# Patient Record
Sex: Male | Born: 1955 | Race: White | Hispanic: No | Marital: Married | Smoking: Never smoker
Health system: Southern US, Community
[De-identification: ages and names within clinical notes are randomized; demographics above are authoritative.]

## PROBLEM LIST (undated history)

## (undated) DIAGNOSIS — K5900 Constipation, unspecified: Secondary | ICD-10-CM

## (undated) DIAGNOSIS — E7800 Pure hypercholesterolemia, unspecified: Secondary | ICD-10-CM

## (undated) DIAGNOSIS — R001 Bradycardia, unspecified: Secondary | ICD-10-CM

## (undated) DIAGNOSIS — M109 Gout, unspecified: Secondary | ICD-10-CM

## (undated) DIAGNOSIS — I2510 Atherosclerotic heart disease of native coronary artery without angina pectoris: Secondary | ICD-10-CM

## (undated) HISTORY — DX: Constipation, unspecified: K59.00

## (undated) HISTORY — DX: Bradycardia, unspecified: R00.1

## (undated) HISTORY — DX: Gout, unspecified: M10.9

## (undated) HISTORY — DX: Atherosclerotic heart disease of native coronary artery without angina pectoris: I25.10

---

## 2000-07-04 ENCOUNTER — Encounter: Payer: Self-pay | Admitting: Emergency Medicine

## 2000-07-04 ENCOUNTER — Emergency Department (HOSPITAL_COMMUNITY): Admission: EM | Admit: 2000-07-04 | Discharge: 2000-07-04 | Payer: Self-pay | Admitting: Emergency Medicine

## 2000-07-07 ENCOUNTER — Emergency Department (HOSPITAL_COMMUNITY): Admission: EM | Admit: 2000-07-07 | Discharge: 2000-07-07 | Payer: Self-pay | Admitting: *Deleted

## 2000-07-23 ENCOUNTER — Encounter: Payer: Self-pay | Admitting: Internal Medicine

## 2000-07-23 ENCOUNTER — Ambulatory Visit (HOSPITAL_COMMUNITY): Admission: RE | Admit: 2000-07-23 | Discharge: 2000-07-23 | Payer: Self-pay | Admitting: Internal Medicine

## 2000-08-15 ENCOUNTER — Ambulatory Visit (HOSPITAL_COMMUNITY): Admission: RE | Admit: 2000-08-15 | Discharge: 2000-08-15 | Payer: Self-pay | Admitting: Internal Medicine

## 2001-09-16 ENCOUNTER — Encounter: Payer: Self-pay | Admitting: Internal Medicine

## 2001-09-16 ENCOUNTER — Ambulatory Visit (HOSPITAL_COMMUNITY): Admission: RE | Admit: 2001-09-16 | Discharge: 2001-09-16 | Payer: Self-pay | Admitting: Internal Medicine

## 2003-05-31 ENCOUNTER — Ambulatory Visit (HOSPITAL_COMMUNITY): Admission: RE | Admit: 2003-05-31 | Discharge: 2003-05-31 | Payer: Self-pay | Admitting: Family Medicine

## 2004-09-25 ENCOUNTER — Ambulatory Visit (HOSPITAL_COMMUNITY): Admission: RE | Admit: 2004-09-25 | Discharge: 2004-09-25 | Payer: Self-pay | Admitting: Family Medicine

## 2004-11-15 ENCOUNTER — Ambulatory Visit: Payer: Self-pay | Admitting: Internal Medicine

## 2004-11-15 ENCOUNTER — Ambulatory Visit (HOSPITAL_COMMUNITY): Admission: RE | Admit: 2004-11-15 | Discharge: 2004-11-15 | Payer: Self-pay | Admitting: Internal Medicine

## 2006-03-19 ENCOUNTER — Emergency Department (HOSPITAL_COMMUNITY): Admission: EM | Admit: 2006-03-19 | Discharge: 2006-03-19 | Payer: Self-pay | Admitting: Emergency Medicine

## 2006-05-16 HISTORY — PX: CARDIAC CATHETERIZATION: SHX172

## 2006-10-01 ENCOUNTER — Ambulatory Visit (HOSPITAL_COMMUNITY): Admission: RE | Admit: 2006-10-01 | Discharge: 2006-10-01 | Payer: Self-pay | Admitting: Urology

## 2006-12-08 ENCOUNTER — Emergency Department (HOSPITAL_COMMUNITY): Admission: EM | Admit: 2006-12-08 | Discharge: 2006-12-08 | Payer: Self-pay | Admitting: Emergency Medicine

## 2007-03-06 ENCOUNTER — Emergency Department (HOSPITAL_COMMUNITY): Admission: EM | Admit: 2007-03-06 | Discharge: 2007-03-06 | Payer: Self-pay | Admitting: Emergency Medicine

## 2009-07-31 HISTORY — PX: US ECHOCARDIOGRAPHY: HXRAD669

## 2009-07-31 HISTORY — PX: NM MYOCAR PERF WALL MOTION: HXRAD629

## 2010-02-22 ENCOUNTER — Emergency Department (HOSPITAL_COMMUNITY): Payer: 59

## 2010-02-22 ENCOUNTER — Emergency Department (HOSPITAL_COMMUNITY)
Admission: EM | Admit: 2010-02-22 | Discharge: 2010-02-22 | Disposition: A | Discharge status: Home / Self Care | Payer: 59 | Attending: Emergency Medicine | Admitting: Emergency Medicine

## 2010-02-22 DIAGNOSIS — X503XXA Overexertion from repetitive movements, initial encounter: Secondary | ICD-10-CM | POA: Insufficient documentation

## 2010-02-22 DIAGNOSIS — Y92009 Unspecified place in unspecified non-institutional (private) residence as the place of occurrence of the external cause: Secondary | ICD-10-CM | POA: Insufficient documentation

## 2010-02-22 DIAGNOSIS — S336XXA Sprain of sacroiliac joint, initial encounter: Secondary | ICD-10-CM | POA: Insufficient documentation

## 2010-05-25 NOTE — Op Note (Signed)
Peter Burch, Peter Burch               ACCOUNT NO.:  [id number]   MEDICAL RECORD NO.:  [id number]          PATIENT TYPE:  AMB   LOCATION:  DAY                           FACILITY:  [facility]   PHYSICIAN:  Lionel December, M.D.    DATE OF BIRTH:  [birth year]   DATE OF PROCEDURE:  11/15/2004  DATE OF DISCHARGE:                                 OPERATIVE REPORT   PROCEDURE:  Colonoscopy.   INDICATION:  Peter Burch is a 55 year old Caucasian male who is undergoing  screening colonoscopy. Family history is negative for colorectal carcinoma.  Procedure risks were reviewed with the patient, and informed consent was  obtained.   PREMEDICATION:  Demerol 50 mg IV, Versed 6 mg IV divided dose.   FINDINGS:  Procedure performed in endoscopy suite. The patient's vital signs  and O2 saturation were monitored during the procedure and remained stable.  The patient was placed in left lateral position and rectal examination  performed. No abnormality noted on external or digital exam. Olympus  videoscope was placed in rectum and advanced under vision into sigmoid colon  and beyond. Preparation was satisfactory. Scope was passed to cecum which  was identified by ileocecal valve and appendiceal orifice. Pictures taken  for the record. As the scope was withdrawn, colonic mucosa was carefully  examined. There was a single small diverticulum at transverse colon, but no  other abnormalities were noted. Rectal mucosa was normal. Scope was  retroflexed to examine anorectal junction which was unremarkable. There was  focal erythema proximal to dentate line felt to be trauma due to Fleet's  enema prep. Endoscope was straightened and withdrawn. The patient tolerated  the procedure well.   FINAL DIAGNOSIS:  Single small diverticulum at transverse colon, otherwise  normal colonoscopy.   RECOMMENDATIONS:  1.  High-fiber diet.  2.  Yearly Hemoccults. He may consider next screening exam in 10 years from      now.      Lionel December, M.D.  Electronically Signed     NR/MEDQ  D:  11/15/2004  T:  11/15/2004  Job:  [id number]   cc:   Kirk Ruths, M.D.  Fax: [phone]

## 2010-10-15 LAB — DIFFERENTIAL
Basophils Absolute: 0
Basophils Relative: 1
Eosinophils Absolute: 0.5
Eosinophils Relative: 8 — ABNORMAL HIGH
Lymphocytes Relative: 20

## 2010-10-15 LAB — CBC
HCT: 42.2
MCHC: 34.4
Platelets: 158
RDW: 13.9

## 2010-10-15 LAB — I-STAT 8, (EC8 V) (CONVERTED LAB)
BUN: 9
Glucose, Bld: 108 — ABNORMAL HIGH
Hemoglobin: 15
Potassium: 4.2
Sodium: 141
TCO2: 26

## 2010-10-15 LAB — AMYLASE: Amylase: 47

## 2010-10-15 LAB — POCT CARDIAC MARKERS: CKMB, poc: 1.3

## 2010-10-15 LAB — LIPASE, BLOOD: Lipase: 24

## 2012-08-19 ENCOUNTER — Ambulatory Visit: Payer: 59 | Admitting: Cardiovascular Disease

## 2012-08-19 ENCOUNTER — Encounter: Payer: Self-pay | Admitting: *Deleted

## 2012-08-20 ENCOUNTER — Ambulatory Visit (INDEPENDENT_AMBULATORY_CARE_PROVIDER_SITE_OTHER): Payer: 59 | Admitting: Cardiovascular Disease

## 2012-08-20 ENCOUNTER — Encounter: Payer: Self-pay | Admitting: Cardiovascular Disease

## 2012-08-20 VITALS — BP 118/80 | HR 66 | Resp 16 | Ht 75.0 in | Wt 233.8 lb

## 2012-08-20 DIAGNOSIS — E785 Hyperlipidemia, unspecified: Secondary | ICD-10-CM

## 2012-08-20 DIAGNOSIS — I2510 Atherosclerotic heart disease of native coronary artery without angina pectoris: Secondary | ICD-10-CM

## 2012-08-20 DIAGNOSIS — E782 Mixed hyperlipidemia: Secondary | ICD-10-CM

## 2012-08-20 DIAGNOSIS — Z79899 Other long term (current) drug therapy: Secondary | ICD-10-CM

## 2012-08-20 NOTE — Patient Instructions (Addendum)
Your physician recommends that you return for lab work. Please fast prior to having your blood work performed.   Your physician recommends that you schedule a follow-up appointment in: 12 months

## 2012-08-21 LAB — COMPREHENSIVE METABOLIC PANEL
ALT: 31 U/L (ref 0–53)
AST: 21 U/L (ref 0–37)
Alkaline Phosphatase: 76 U/L (ref 39–117)
Chloride: 104 mEq/L (ref 96–112)
Creat: 1.29 mg/dL (ref 0.50–1.35)
Total Bilirubin: 1.1 mg/dL (ref 0.3–1.2)

## 2012-08-21 LAB — LIPID PANEL
HDL: 43 mg/dL (ref >39)
LDL Cholesterol: 137 mg/dL — ABNORMAL HIGH (ref 0–99)
Total CHOL/HDL Ratio: 5 Ratio
VLDL: 34 mg/dL (ref 0–40)

## 2012-08-26 ENCOUNTER — Telehealth: Payer: Self-pay | Admitting: Cardiovascular Disease

## 2012-08-26 NOTE — Telephone Encounter (Signed)
Returned call and spoke w/ pt.  Informed results have not been reviewed by MD/PA and nurse will call after they are reviewed.  Pt verbalized understanding and agreed w/ plan.  Pt would like results called to him when reviewed and informed Britta Mccreedy, CMA will be notified.

## 2012-08-26 NOTE — Telephone Encounter (Signed)
Would like to know the results of his lab work . Can l;eave a msg with the results .Marland Kitchen Please Call.   Thanks

## 2012-08-28 MED ORDER — PRAVASTATIN SODIUM 40 MG PO TABS: 40.0000 mg | ORAL_TABLET | Freq: Every evening | ORAL | Status: DC

## 2012-08-28 NOTE — Telephone Encounter (Signed)
Called and recommended pravastatin 40 mg every evening and recheck lipids in 3 months. Rx and labs ordered.

## 2012-09-01 ENCOUNTER — Encounter: Payer: Self-pay | Admitting: Cardiovascular Disease

## 2012-09-01 DIAGNOSIS — E782 Mixed hyperlipidemia: Secondary | ICD-10-CM | POA: Insufficient documentation

## 2012-09-01 DIAGNOSIS — I2510 Atherosclerotic heart disease of native coronary artery without angina pectoris: Secondary | ICD-10-CM | POA: Insufficient documentation

## 2012-09-01 NOTE — Progress Notes (Signed)
Patient ID: Peter Burch, male   DOB: Oct 04, 1955, 57 y.o.   MRN: [id number]     Reason for office visit Coronary atherosclerosis, hyperlipidemia  Peter Burch is now 57 years old he continues to stay very active. He walks or runs 3 miles a day at least 3-4 days a week. He is able to complete a mile in less than 12 minutes. He has no complaints of shortness of breath, chest pain, dizziness or intermittent claudication during activity. He also denies any focal neurological complaints, erectile dysfunction or lower extremity edema.  Cardiac catheterization performed in May of 2008 showed a 40% mildly calcified lesion in the proximal LAD artery. Nuclear stress testing in 2011 did not show any evidence of anterior wall abnormalities although it did show an inferior wall defect interpreted as representing scar with mild peri-infarct ischemia. His electrocardiogram does not show evidence of a previous inferior wall infarct. He is very tall and has a very deep chest suggesting that the abnormality may actually have represented diaphragmatic attenuation artifact. Repeat cardiac catheterization was not performed. A similar defect had led to his cardiac catheterization in 2008 and no stenoses were seen in the dominant right coronary artery at that time.  Despite his active lifestyle he continues to have an unfavorable lipid profile total cholesterol 224 LDL 155 HDL 42 triglycerides 136. Numerous statins have been tried in the past tablet to lifestyle limiting muscle weakness and pain. He is currently not on statin therapy.   He also has a history of gout but no recent attacks.    Allergies  Allergen Reactions  . Statins     Myalgias Crestor,vytorin,livalo    Current Outpatient Prescriptions  Medication Sig Dispense Refill  . ALPRAZolam (XANAX) 0.5 MG tablet Take 1 tablet by mouth as needed.      . colchicine 0.6 MG tablet Take 0.6 mg by mouth daily as needed.      . febuxostat (ULORIC) 40 MG tablet Take  40 mg by mouth daily.       . fluticasone (FLONASE) 50 MCG/ACT nasal spray Place 1 spray into the nose as needed.      . pravastatin (PRAVACHOL) 40 MG tablet Take 1 tablet (40 mg total) by mouth every evening.  90 tablet  3   No current facility-administered medications for this visit.    Past Medical History  Diagnosis Date  . CAD (coronary artery disease)   . Sinus bradycardia     Past Surgical History  Procedure Laterality Date  . Cardiac catheterization  05/16/2006    40% LAD lesion  . US echocardiography  07/31/2009    impaired LV relaxation  . Nm myocar perf wall motion  07/31/2009    mod to severe defect due to infarct/scar w/mild perinfarct ischemia    Family History  Problem Relation Age of Onset  . Heart attack Mother   . Cancer Father     lung    History   Social History  . Marital Status: Married    Spouse Name: N/A    Number of Children: N/A  . Years of Education: N/A   Occupational History  . Not on file.   Social History Main Topics  . Smoking status: Never Smoker   . Smokeless tobacco: Never Used  . Alcohol Use: Yes     Comment: social, occasionally  . Drug Use: No  . Sexual Activity: Not on file   Other Topics Concern  . Not on file   Social History  Narrative  . No narrative on file    Review of systems: The patient specifically denies any chest pain at rest or with exertion, dyspnea at rest or with exertion, orthopnea, paroxysmal nocturnal dyspnea, syncope, palpitations, focal neurological deficits, intermittent claudication, lower extremity edema, unexplained weight gain, cough, hemoptysis or wheezing.  The patient also denies abdominal pain, nausea, vomiting, dysphagia, diarrhea, constipation, polyuria, polydipsia, dysuria, hematuria, frequency, urgency, abnormal bleeding or bruising, fever, chills, unexpected weight changes, mood swings, change in skin or hair texture, change in voice quality, auditory or visual problems, allergic reactions  or rashes, new musculoskeletal complaints other than usual "aches and pains".   PHYSICAL EXAM BP 118/80  Pulse 66  Resp 16  Ht 6\' 3"  (1.905 m)  Wt 233 lb 12.8 oz (106.051 kg)  BMI 29.22 kg/m2  General: Alert, oriented x3, no distress Head: no evidence of trauma, PERRL, EOMI, no exophtalmos or lid lag, no myxedema, no xanthelasma; normal ears, nose and oropharynx Neck: normal jugular venous pulsations and no hepatojugular reflux; brisk carotid pulses without delay and no carotid bruits Chest: clear to auscultation, no signs of consolidation by percussion or palpation, normal fremitus, symmetrical and full respiratory excursions Cardiovascular: normal position and quality of the apical impulse, regular rhythm, normal first and second heart sounds, no murmurs, rubs or gallops Abdomen: no tenderness or distention, no masses by palpation, no abnormal pulsatility or arterial bruits, normal bowel sounds, no hepatosplenomegaly Extremities: no clubbing, cyanosis or edema; 2+ radial, ulnar and brachial pulses bilaterally; 2+ right femoral, posterior tibial and dorsalis pedis pulses; 2+ left femoral, posterior tibial and dorsalis pedis pulses; no subclavian or femoral bruits Neurological: grossly nonfocal   EKG: NSR, normal  Lipid Panel  January 2014 total cholesterol 224, triglycerides  136, HDL 42, LDL 155    Component Value Date/Time   CHOL 214* 08/21/2012 0704   TRIG 171* 08/21/2012 0704   HDL 43 08/21/2012 0704   CHOLHDL 5.0 08/21/2012 0704   VLDL 34 08/21/2012 0704   LDLCALC 137* 08/21/2012 0704    BMET    Component Value Date/Time   NA 140 08/21/2012 0704   K 4.1 08/21/2012 0704   CL 104 08/21/2012 0704   CO2 28 08/21/2012 0704   GLUCOSE 80 08/21/2012 0704   BUN 15 08/21/2012 0704   CREATININE 1.29 08/21/2012 0704   CREATININE 1.2 12/08/2006 0507   CALCIUM 9.7 08/21/2012 0704     ASSESSMENT AND PLAN Coronary atherosclerosis Despite a very active lifestyle he is completely free of  angina or other cardiac symptoms. Unfortunately his major coronary risk factors (hypercholesterolemia) are difficult to address. One thing he can therefore improve upon his weight control since he is borderline obese. Have encouraged him to keep up the good work with exercise but to pay more attention to calorie restriction, especially avoiding sugars and starches of high glycemic index and increasing his intake of protein and unsaturated fat.  Hyperlipidemia His lipid profile is still unfavorable. He has actually gained about 4 pounds since his last appointment so it is unlikely to be any better. He agrees to give pravastatin and tries well. Being the most water-soluble statin this is hopefully less likely to cause the side effects. We will recheck his lipid profile in several months. Niacin is not a good choice because of his history of gout. Fibrates are not likely to be very beneficial given his lipid profile   Orders Placed This Encounter  Procedures  . Comp Met (CMET)  . Lipid Profile  .  Lipid Profile  . EKG 12-Lead   Meds ordered this encounter  Medications  . ALPRAZolam (XANAX) 0.5 MG tablet    Sig: Take 1 tablet by mouth as needed.  . fluticasone (FLONASE) 50 MCG/ACT nasal spray    Sig: Place 1 spray into the nose as needed.  . pravastatin (PRAVACHOL) 40 MG tablet    Sig: Take 1 tablet (40 mg total) by mouth every evening.    Dispense:  90 tablet    Refill:  3    Yazen Rosko  Thurmon Fair, MD, [facility] [phone] office [phone] pager

## 2012-09-01 NOTE — Assessment & Plan Note (Addendum)
His lipid profile is still unfavorable. He has actually gained about 4 pounds since his last appointment so it is unlikely to be any better. He agrees to give pravastatin and tries well. Being the most water-soluble statin this is hopefully less likely to cause the side effects. We will recheck his lipid profile in several months. Niacin is not a good choice because of his history of gout. Fibrates are not likely to be very beneficial given his lipid profile

## 2012-09-01 NOTE — Assessment & Plan Note (Signed)
Despite a very active lifestyle he is completely free of angina or other cardiac symptoms. Unfortunately his major coronary risk factors (hypercholesterolemia) are difficult to address. One thing he can therefore improve upon his weight control since he is borderline obese. Have encouraged him to keep up the good work with exercise but to pay more attention to calorie restriction, especially avoiding sugars and starches of high glycemic index and increasing his intake of protein and unsaturated fat.

## 2012-09-17 ENCOUNTER — Encounter: Payer: Self-pay | Admitting: Cardiovascular Disease

## 2012-10-01 ENCOUNTER — Telehealth: Payer: Self-pay | Admitting: *Deleted

## 2012-10-01 NOTE — Telephone Encounter (Signed)
Did not check MyChart for a response to his question about side effects of Pravastatin.  States he stopped taking the day he sent the e-mail and about 4 days later symptoms resolved.  He has failed other statins due to myalgias.  He will stay off Pravastatin and continue w/weight loss, exercise and good diet.

## 2012-10-01 NOTE — Telephone Encounter (Signed)
LM to check his e-mail- re-response to his e-mail.  Told to check and if he has any questions please call.

## 2012-10-14 ENCOUNTER — Telehealth (HOSPITAL_COMMUNITY): Payer: Self-pay | Admitting: Dietician

## 2012-10-14 NOTE — Telephone Encounter (Signed)
Received referral via fax from [facility] for dx: wt loss, gout.

## 2012-10-15 NOTE — Telephone Encounter (Signed)
Called and left message on voicemail at 1020.

## 2012-10-19 NOTE — Telephone Encounter (Signed)
Returned call at [facility]. Left another message on voicemail.

## 2012-10-19 NOTE — Telephone Encounter (Signed)
Pt left voicemail at 1222. Called back at 1225. Pt requests appointment either today or tomorrow, due to being off. Appointment scheduled for 10/20/12 at 1530.

## 2012-10-19 NOTE — Telephone Encounter (Signed)
Received voicemail left by pt at 0910.

## 2012-10-20 ENCOUNTER — Encounter (HOSPITAL_COMMUNITY): Payer: Self-pay | Admitting: Dietician

## 2012-10-20 NOTE — Progress Notes (Signed)
Outpatient Initial Nutrition Assessment  Date:10/20/2012   Appt Start Time: 1526  Referring Physician: Dr. Titus Dubin Reason for Visit: weight loss, gout  Nutrition Assessment:  Height: 6\' 3"  (190.5 cm)   Weight: 228 lb (103.42 kg)   IBW: 196# %IBW: 116% UBW: 236# %UBW: 97%  Body mass index is 28.5 kg/(m^2).  Meets criteria for obesity. Goal Weight: 205# (10% loss of current weight) Weight hx: Pt reports his heaviest weight was 236# last year. Noted a 5# (2.1%) loss x 10 months and 8# (3.3%) wt loss x 1 year.  Estimated nutritional needs:  Kcals/ day: 1900-2000 Protein (grams)/day: 83-104 Fluid (L)/ day: 1.9-2.0  PMH:  Past Medical History  Diagnosis Date  . CAD (coronary artery disease)   . Sinus bradycardia   . Gout     Medications:  Current Outpatient Rx  Name  Route  Sig  Dispense  Refill  . ALPRAZolam (XANAX) 0.5 MG tablet   Oral   Take 1 tablet by mouth as needed.         . colchicine 0.6 MG tablet   Oral   Take 0.6 mg by mouth daily as needed.         . febuxostat (ULORIC) 40 MG tablet   Oral   Take 40 mg by mouth daily.          . fluticasone (FLONASE) 50 MCG/ACT nasal spray   Nasal   Place 1 spray into the nose as needed.         . pravastatin (PRAVACHOL) 40 MG tablet   Oral   Take 1 tablet (40 mg total) by mouth every evening.   90 tablet   3     Labs: CMP     Component Value Date/Time   NA 140 08/21/2012 0704   K 4.1 08/21/2012 0704   CL 104 08/21/2012 0704   CO2 28 08/21/2012 0704   GLUCOSE 80 08/21/2012 0704   BUN 15 08/21/2012 0704   CREATININE 1.29 08/21/2012 0704   CREATININE 1.2 12/08/2006 0507   CALCIUM 9.7 08/21/2012 0704   PROT 6.1 08/21/2012 0704   ALBUMIN 4.4 08/21/2012 0704   AST 21 08/21/2012 0704   ALT 31 08/21/2012 0704   ALKPHOS 76 08/21/2012 0704   BILITOT 1.1 08/21/2012 0704    Lipid Panel     Component Value Date/Time   CHOL 214* 08/21/2012 0704   TRIG 171* 08/21/2012 0704   HDL 43 08/21/2012 0704   CHOLHDL 5.0  08/21/2012 0704   VLDL 34 08/21/2012 0704   LDLCALC 137* 08/21/2012 0704     No results found for this basename: HGBA1C   Lab Results  Component Value Date   LDLCALC 137* 08/21/2012   CREATININE 1.29 08/21/2012     Lifestyle/ social habits: Peter Burch resides in [place] with his wife. His children are grown. 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 He reports that he has tried to become physically active this past year and has been walking and running for 45 minutes to 1 hours 3-4 times per week in the summer month. However, he tell this RD, "once the time changes that will stop; I don't want to run in the dark and I've never been a gym person". He occasionally runs on the weekends. He also tracks his physical activity on an app on his phone.   Nutrition hx/habits: Peter Burch reports "I'm going to be honest with you; I'm not a very good  patient. I only take my gout medications when I have flare-ups, but I'm trying to be better about taking it on a more consistent basis". He shares his latest labs results with me and he states that his cholesterol is always slightly elevated and this is "normal" for him. He is concerned that his triglycerides are "the highest they've ever been".  He states that he was advised to lose weight at his last cardiologist appointment. He reports he was told he was "borderline obese" and that concerned him. He states that he looks at his weight realistically and reports "I don't want to look skinny or sick". In the past year, he has noticed that he has gone up an inch to 1 1/2 inches in pant sizes. He reports this is due to a busy lifestyle, mainly taking care of his mother-in-law. He reports "evenings my wife and I fended for ourselves. With all that was going on, it was easier to eat some chips or go out then prepare a meal".   Diet recall: Pt reports excessive snacking and increased convenience foods, particularly at dinner time. He eats fast food  daily at lunch.   Nutrition Diagnosis: Involuntary weight gain r/t excessive energy intake, physical inactivity AEB pt reports hx of weight gain.   Nutrition Intervention: Nutrition rx: 1800 kcal NAS, no sugar added diet; 3 meals per day; low calorie beverages most often; be as physically active as possible (goal of at least 2.5 hours physical activity daily)  Education/Counseling Provided: Educated pt on principles of low purine diet. Discussed sources of purines and identified low, moderate, and high purine foods. Discussed role purine have in the development of uric acid, which can aggravate gout. Educated pt on principles of weight management. Discussed principles of energy expenditure and how changes in diet and physical activity affect weight status. Discussed nutritional content of commonly eaten foods and suggested healthier alternatives. Educated pt on plate method and a general, healthful diet that includes low fat dairy, lean meats, whole fruits and vegetables, and whole grains most often. Discussed importance of a healthy diet along with regular physical activity (at least 30 minutes 5 times per week) to achieve weight loss goals. Encouraged pt to continue to engage in physical activity as best as he could during the darker months and suggested he think of alternative exercises that he can do when it is dark. Encouraged slow, moderate weight loss (0.5-2# weight loss per week) and adopting healthy lifestyle changes vs. obtaining a certain body type or weight. Used TeachBack to assess understanding.   Understanding, Motivation, Ability to Follow Recommendations: Expect fair compliance.   Monitoring and Evaluation: Goals: 1) 0.5-2# wt loss per week; 2) 2.5 hours physical activity daily  Recommendations: 1) Be as physically active as possible- look for other alternatives other than running that you will be able to do when it gets dark; 2) Aim for 7-10% weight loss  F/U: PRN. Pt did not desire  follow-up. RD contact information given.  Korayma Hagwood A. Mayford Knife, RD, LDN 10/20/2012  Appt EndTime: 1610

## 2012-11-12 ENCOUNTER — Other Ambulatory Visit: Payer: Self-pay

## 2013-04-30 ENCOUNTER — Other Ambulatory Visit: Payer: Self-pay | Admitting: Neurosurgery

## 2013-04-30 DIAGNOSIS — M5136 Other intervertebral disc degeneration, lumbar region: Secondary | ICD-10-CM

## 2013-05-09 ENCOUNTER — Ambulatory Visit
Admission: RE | Admit: 2013-05-09 | Discharge: 2013-05-09 | Disposition: A | Discharge status: Home / Self Care | Payer: 59 | Source: Ambulatory Visit | Attending: Neurosurgery | Admitting: Neurosurgery

## 2013-05-09 DIAGNOSIS — M5136 Other intervertebral disc degeneration, lumbar region: Secondary | ICD-10-CM

## 2013-08-17 ENCOUNTER — Telehealth: Payer: Self-pay | Admitting: Cardiovascular Disease

## 2013-08-19 NOTE — Telephone Encounter (Signed)
Closed encounter °

## 2013-08-20 ENCOUNTER — Telehealth: Payer: Self-pay | Admitting: Cardiovascular Disease

## 2013-08-20 NOTE — Telephone Encounter (Signed)
Closed encounter °

## 2013-10-04 ENCOUNTER — Encounter: Payer: Self-pay | Admitting: Cardiovascular Disease

## 2013-10-04 ENCOUNTER — Ambulatory Visit (INDEPENDENT_AMBULATORY_CARE_PROVIDER_SITE_OTHER): Payer: 59 | Admitting: Cardiovascular Disease

## 2013-10-04 VITALS — BP 108/72 | HR 65 | Ht 75.0 in | Wt 235.5 lb

## 2013-10-04 DIAGNOSIS — E785 Hyperlipidemia, unspecified: Secondary | ICD-10-CM

## 2013-10-04 DIAGNOSIS — I2510 Atherosclerotic heart disease of native coronary artery without angina pectoris: Secondary | ICD-10-CM

## 2013-10-04 NOTE — Patient Instructions (Signed)
Dr. Croitoru recommends that you schedule a follow-up appointment in: ONE YEAR   

## 2013-10-08 NOTE — Progress Notes (Signed)
Patient ID: Peter Burch, male   DOB: 1955-09-18, 58 y.o.   MRN: [id number]      Reason for office visit Coronary atherosclerosis, hyperlipidemia  LAD it has turned 58 years old and is as active. He is planning to participate in a 5K race in [place]. He walks several days a week on the [place]. Usually completes in about 44 minutes. He has no cardiac complaints.  Cardiac catheterization performed in May of 2008 showed a 40% mildly calcified lesion in the proximal LAD artery. Nuclear stress testing in 2011 did not show any evidence of anterior wall abnormalities although it did show an inferior wall defect interpreted as representing scar with mild peri-infarct ischemia. His electrocardiogram does not show evidence of a previous inferior wall infarct. He is very tall and has a very deep chest suggesting that the abnormality may actually have represented diaphragmatic attenuation artifact. Repeat cardiac catheterization was not performed. A similar defect had led to his cardiac catheterization in 2008 and no stenoses were seen in the dominant right coronary artery at that time.  Unfortunately he has gained weight since his last appointment. He is now weighing 37 inch pants. He is borderline obese with a BMI just under 29.  He is a history of hyperlipidemia but has been intolerant to numerous lipid-lowering agents including Crestor, Vytorin, pravastatin and Livalo, secondary to myalgia.  He has hyperuricemia and gout without recent attacks  Allergies  Allergen Reactions  . Statins     Myalgias Crestor,vytorin,livalo    Current Outpatient Prescriptions  Medication Sig Dispense Refill  . ALPRAZolam (XANAX) 0.5 MG tablet Take 1 tablet by mouth as needed.      . colchicine 0.6 MG tablet Take 0.6 mg by mouth daily as needed.      . febuxostat (ULORIC) 40 MG tablet Take 40 mg by mouth daily.       . fluticasone (FLONASE) 50 MCG/ACT nasal spray Place 1 spray into the nose as needed.       . pravastatin (PRAVACHOL) 40 MG tablet Take 1 tablet (40 mg total) by mouth every evening.  90 tablet  3   No current facility-administered medications for this visit.    Past Medical History  Diagnosis Date  . CAD (coronary artery disease)   . Sinus bradycardia   . Gout     Past Surgical History  Procedure Laterality Date  . Cardiac catheterization  05/16/2006    40% LAD lesion  . US echocardiography  07/31/2009    impaired LV relaxation  . Nm myocar perf wall motion  07/31/2009    mod to severe defect due to infarct/scar w/mild perinfarct ischemia    Family History  Problem Relation Age of Onset  . Heart attack Mother   . Cancer Father     lung    History   Social History  . Marital Status: Married    Spouse Name: N/A    Number of Children: N/A  . Years of Education: N/A   Occupational History  . Not on file.   Social History Main Topics  . Smoking status: Never Smoker   . Smokeless tobacco: Never Used  . Alcohol Use: Yes     Comment: social, occasionally  . Drug Use: No  . Sexual Activity: Not on file   Other Topics Concern  . Not on file   Social History Narrative  . No narrative on file    Review of systems: The patient specifically denies any chest  pain at rest or with exertion, dyspnea at rest or with exertion, orthopnea, paroxysmal nocturnal dyspnea, syncope, palpitations, focal neurological deficits, intermittent claudication, lower extremity edema, unexplained weight gain, cough, hemoptysis or wheezing.  The patient also denies abdominal pain, nausea, vomiting, dysphagia, diarrhea, constipation, polyuria, polydipsia, dysuria, hematuria, frequency, urgency, abnormal bleeding or bruising, fever, chills, unexpected weight changes, mood swings, change in skin or hair texture, change in voice quality, auditory or visual problems, allergic reactions or rashes, new musculoskeletal complaints other than usual "aches and pains".   PHYSICAL EXAM BP  108/72  Pulse 65  Ht 6\' 3"  (1.905 m)  Wt 106.822 kg (235 lb 8 oz)  BMI 29.44 kg/m2  General: Alert, oriented x3, no distress Head: no evidence of trauma, PERRL, EOMI, no exophtalmos or lid lag, no myxedema, no xanthelasma; normal ears, nose and oropharynx Neck: normal jugular venous pulsations and no hepatojugular reflux; brisk carotid pulses without delay and no carotid bruits Chest: clear to auscultation, no signs of consolidation by percussion or palpation, normal fremitus, symmetrical and full respiratory excursions Cardiovascular: normal position and quality of the apical impulse, regular rhythm, normal first and second heart sounds, no murmurs, rubs or gallops Abdomen: no tenderness or distention, no masses by palpation, no abnormal pulsatility or arterial bruits, normal bowel sounds, no hepatosplenomegaly Extremities: no clubbing, cyanosis or edema; 2+ radial, ulnar and brachial pulses bilaterally; 2+ right femoral, posterior tibial and dorsalis pedis pulses; 2+ left femoral, posterior tibial and dorsalis pedis pulses; no subclavian or femoral bruits Neurological: grossly nonfocal   EKG: Normal sinus rhythm, normal tracing  Lipid Panel  The following labs became available after she had left the office. They're dated 07/07/2013. Cholesterol 212, triglycerides 242, HDL 37, LDL 127     Component Value Date/Time   CHOL 214* 08/21/2012 0704   TRIG 171* 08/21/2012 0704   HDL 43 08/21/2012 0704   CHOLHDL 5.0 08/21/2012 0704   VLDL 34 08/21/2012 0704   LDLCALC 137* 08/21/2012 0704    BMET    Component Value Date/Time   NA 140 08/21/2012 0704   K 4.1 08/21/2012 0704   CL 104 08/21/2012 0704   CO2 28 08/21/2012 0704   GLUCOSE 80 08/21/2012 0704   BUN 15 08/21/2012 0704   CREATININE 1.29 08/21/2012 0704   CREATININE 1.2 12/08/2006 0507   CALCIUM 9.7 08/21/2012 0704     ASSESSMENT AND PLAN  Jencarlos has known coronary atherosclerosis without severe obstructive lesions and is completely  asymptomatic. His major coronary risk factor is mixed hyperlipidemia. I'm sure that his lipid profile would improve substantially if he was able to lose weight. He is physically quite active, but I think is not nearly as compliant with dietary recommendations. We discussed importance of avoiding sweets and carbohydrates with a high glycemic index in general. He should try and Increase his intake of protein and unsaturated fats. Overall he needs to restrict his calories in order to lose weight.  His most recent lipid profile only became available after he left the clinic today. It's not unreasonable to try Zetia 10 mg once daily. We'll call him with that suggestion  Patient Instructions  Dr. Sallyanne Kuster recommends that you schedule a follow-up appointment in: [place].      Orders Placed This Encounter  Procedures  . EKG 12-Lead   Sanam Marmo  Sanda Klein, MD, [facility] [phone] office [phone] pager

## 2013-10-11 ENCOUNTER — Telehealth: Payer: Self-pay | Admitting: *Deleted

## 2013-10-11 DIAGNOSIS — E782 Mixed hyperlipidemia: Secondary | ICD-10-CM

## 2013-10-11 DIAGNOSIS — Z79899 Other long term (current) drug therapy: Secondary | ICD-10-CM

## 2013-10-11 MED ORDER — EZETIMIBE 10 MG PO TABS: 10.0000 mg | ORAL_TABLET | Freq: Every day | ORAL | Status: DC

## 2013-10-11 NOTE — Telephone Encounter (Signed)
Message copied by Tressa Busman on Mon Oct 11, 2013 12:53 PM ------      Message from: Sanda Klein      Created: Fri Oct 08, 2013  7:15 PM       Would like him to try Zetia 10 mg daily and repeat lipids in 3 months.      Let him know this is not a statin. We may have samples for him. ------

## 2013-10-11 NOTE — Telephone Encounter (Signed)
Patient is willing to try Zetia 10mg  daily.  Rx sent to [facility].  Lab order mailed to patient to have rechecked in 3 months.

## 2014-02-18 ENCOUNTER — Ambulatory Visit (INDEPENDENT_AMBULATORY_CARE_PROVIDER_SITE_OTHER): Payer: 59 | Admitting: Urology

## 2014-02-18 DIAGNOSIS — Z87438 Personal history of other diseases of male genital organs: Secondary | ICD-10-CM

## 2014-03-16 ENCOUNTER — Encounter: Payer: Self-pay | Admitting: Cardiovascular Disease

## 2014-03-16 LAB — LIPID PANEL
CHOL/HDL RATIO: 4.6 ratio
CHOLESTEROL: 174 mg/dL (ref 0–200)
HDL: 38 mg/dL — ABNORMAL LOW (ref >=40)
LDL Cholesterol: 112 mg/dL — ABNORMAL HIGH (ref 0–99)
Triglycerides: 121 mg/dL (ref <150)
VLDL: 24 mg/dL (ref 0–40)

## 2014-06-06 ENCOUNTER — Other Ambulatory Visit: Payer: Self-pay | Admitting: Cardiovascular Disease

## 2014-06-07 NOTE — Telephone Encounter (Signed)
Rx(s) sent to pharmacy electronically.  

## 2014-07-25 ENCOUNTER — Telehealth: Payer: Self-pay | Admitting: Cardiovascular Disease

## 2014-07-26 NOTE — Telephone Encounter (Signed)
Closed encounter °

## 2014-10-24 ENCOUNTER — Encounter: Payer: Self-pay | Admitting: Cardiovascular Disease

## 2014-10-24 ENCOUNTER — Ambulatory Visit (INDEPENDENT_AMBULATORY_CARE_PROVIDER_SITE_OTHER): Payer: 59 | Admitting: Cardiovascular Disease

## 2014-10-24 VITALS — BP 122/90 | HR 65 | Ht 75.0 in | Wt 234.9 lb

## 2014-10-24 DIAGNOSIS — I2510 Atherosclerotic heart disease of native coronary artery without angina pectoris: Secondary | ICD-10-CM | POA: Diagnosis not present

## 2014-10-24 NOTE — Progress Notes (Signed)
Patient ID: Peter Burch, male   DOB: [birth year], 59 y.o.   MRN: [id number]     Cardiology Office Note   Date:  10/24/2014   ID:  Peter Burch, DOB 1955-07-31, MRN [id number]  PCP:  Peter Grills, MD  Cardiologist:   Peter Klein, MD   Chief Complaint  Patient presents with  . Annual Exam      History of Present Illness: Peter Burch is a 59 y.o. male who presents for CAD follow up.   Peter Burch is doing well. He continues [place].  Competed in a 5K race recently.  Denies angina pectoris, exertional dyspnea , intermittent claudication, lower extremity edema, neurological complaints or other cardiovascular issues. He had a bad spell with gout in his left knee and is still taking a prednisone Dosepak. He has not succeeded in losing any weight and remains borderline obese with a BMI of just under 30. He is statin intolerant and his most recent LDL cholesterol was 112 on treatment with Zetia.  Cardiac catheterization performed in May of 2008 showed a 40% mildly calcified lesion in the proximal LAD artery. Nuclear stress testing in 2011 did not show any evidence of anterior wall abnormalities although it did show an inferior wall defect interpreted as representing scar with mild peri-infarct ischemia. His electrocardiogram does not show evidence of a previous inferior wall infarct. He is very tall and has a very deep chest suggesting that the abnormality may actually have represented diaphragmatic attenuation artifact. Repeat cardiac catheterization was not performed. A similar defect had led to his cardiac catheterization in 2008 and no stenoses were seen in the dominant right coronary artery at that time.  Past Medical History  Diagnosis Date  . CAD (coronary artery disease)   . Sinus bradycardia   . Gout     Past Surgical History  Procedure Laterality Date  . Cardiac catheterization  05/16/2006    40% LAD lesion  . US echocardiography   07/31/2009    impaired LV relaxation  . Nm myocar perf wall motion  07/31/2009    mod to severe defect due to infarct/scar w/mild perinfarct ischemia     Current Outpatient Prescriptions  Medication Sig Dispense Refill  . ALPRAZolam (XANAX) 0.5 MG tablet Take 1 tablet by mouth as needed.    . colchicine 0.6 MG tablet Take 0.6 mg by mouth daily as needed.    . ezetimibe (ZETIA) 10 MG tablet Take 1 tablet (10 mg total) by mouth daily. 30 tablet 5  . febuxostat (ULORIC) 40 MG tablet Take 40 mg by mouth daily.     . fluticasone (FLONASE) 50 MCG/ACT nasal spray Place 1 spray into the nose as needed.    . predniSONE (DELTASONE) 5 MG tablet Take as directed    . ULORIC 80 MG TABS Take 80 mg by mouth daily.     No current facility-administered medications for this visit.    Allergies:   Statins    Social History:  The patient  reports that he has never smoked. He has never used smokeless tobacco. He reports that he drinks alcohol. He reports that he does not use illicit drugs.   Family History:  The patient's family history includes Cancer in his father; Heart attack in his mother.    ROS:  Please see the history of present illness.    Otherwise, review of systems positive for none.   All other systems are reviewed and negative.  PHYSICAL EXAM: VS:  BP 122/90 mmHg  Pulse 65  Ht 6\' 3"  (1.905 m)  Wt 234 lb 14.4 oz (106.55 kg)  BMI 29.36 kg/m2 , BMI Body mass index is 29.36 kg/(m^2).  General: Alert, oriented x3, no distress Head: no evidence of trauma, PERRL, EOMI, no exophtalmos or lid lag, no myxedema, no xanthelasma; normal ears, nose and oropharynx Neck: normal jugular venous pulsations and no hepatojugular reflux; brisk carotid pulses without delay and no carotid bruits Chest: clear to auscultation, no signs of consolidation by percussion or palpation, normal fremitus, symmetrical and full respiratory excursions Cardiovascular: normal position and quality of the apical impulse,  regular rhythm, normal first and second heart sounds, no murmurs, rubs or gallops Abdomen: no tenderness or distention, no masses by palpation, no abnormal pulsatility or arterial bruits, normal bowel sounds, no hepatosplenomegaly Extremities: no clubbing, cyanosis or edema;  Prominent bilateral varicose veins;2+ radial, ulnar and brachial pulses bilaterally; 2+ right femoral, posterior tibial and dorsalis pedis pulses; 2+ left femoral, posterior tibial and dorsalis pedis pulses; no subclavian or femoral bruits Neurological: grossly nonfocal Psych: euthymic mood, full affect   EKG:  EKG is ordered today. The ekg ordered today demonstrates  Normal sinus rhythm , normal tracing   Recent Labs: No results found for requested labs within last 365 days.    Lipid Panel    Component Value Date/Time   CHOL 174 03/15/2014 0924   TRIG 121 03/15/2014 0924   HDL 38* 03/15/2014 0924   CHOLHDL 4.6 03/15/2014 0924   VLDL 24 03/15/2014 0924   LDLCALC 112* 03/15/2014 0924      Wt Readings from Last 3 Encounters:  10/24/14 234 lb 14.4 oz (106.55 kg)  10/04/13 235 lb 8 oz (106.822 kg)  10/20/12 228 lb (103.42 kg)   .   ASSESSMENT AND PLAN:   [name] continues to be free of any symptoms of coronary artery insufficiency although when he has known mild coronary atherosclerosis. I again encouraged him to try to lose weight, primarily by limiting the intake of sweets and starches. He starts today with cereal and would do better to start with yogurt or egg whites. He does need to restrict his overall calorie intake for meaningful weight loss. There has been a fairly minor improvement in his LDL cholesterol on Zetia. On the other hand, he is not far from the target LDL of less than 100.    Current medicines are reviewed at length with the patient today.  The patient does not have concerns regarding medicines.  The following changes have been made:  no change  Labs/ tests ordered today include:    Orders Placed This Encounter  Procedures  . EKG 12-Lead     Patient Instructions  Dr Sallyanne Kuster recommends that you schedule a follow-up appointment in 1 year. You will receive a reminder letter in the mail two months in advance. If you don't receive a letter, please call our office to schedule the follow-up appointment.  If you need a refill on your cardiac medications before your next appointment, please call your pharmacy.      Mikael Spray, MD  10/24/2014 1:25 PM    Peter Klein, MD, [facility] [phone] office [phone] pager

## 2014-10-24 NOTE — Patient Instructions (Signed)
Dr Croitoru recommends that you schedule a follow-up appointment in 1 year. You will receive a reminder letter in the mail two months in advance. If you don't receive a letter, please call our office to schedule the follow-up appointment.  If you need a refill on your cardiac medications before your next appointment, please call your pharmacy. 

## 2014-11-11 ENCOUNTER — Encounter (INDEPENDENT_AMBULATORY_CARE_PROVIDER_SITE_OTHER): Payer: Self-pay | Admitting: *Deleted

## 2014-11-23 ENCOUNTER — Other Ambulatory Visit (INDEPENDENT_AMBULATORY_CARE_PROVIDER_SITE_OTHER): Payer: Self-pay | Admitting: *Deleted

## 2014-11-23 DIAGNOSIS — Z1211 Encounter for screening for malignant neoplasm of colon: Secondary | ICD-10-CM

## 2014-12-14 ENCOUNTER — Telehealth (INDEPENDENT_AMBULATORY_CARE_PROVIDER_SITE_OTHER): Payer: Self-pay | Admitting: *Deleted

## 2014-12-14 DIAGNOSIS — Z1211 Encounter for screening for malignant neoplasm of colon: Secondary | ICD-10-CM

## 2014-12-14 MED ORDER — PEG 3350-KCL-NA BICARB-NACL 420 G PO SOLR: 4000.0000 mL | Freq: Once | ORAL | Status: DC

## 2014-12-14 NOTE — Telephone Encounter (Signed)
Patient needs trilyte 

## 2014-12-23 ENCOUNTER — Telehealth (INDEPENDENT_AMBULATORY_CARE_PROVIDER_SITE_OTHER): Payer: Self-pay | Admitting: *Deleted

## 2014-12-23 NOTE — Telephone Encounter (Signed)
Referring MD/PCP: golding   Procedure: tcs  Reason/Indication:  screening  Has patient had this procedure before?  Yes, 2006   If so, when, by whom and where?    Is there a family history of colon cancer?  no  Who?  What age when diagnosed?    Is patient diabetic?   no      Does patient have prosthetic heart valve or mechanical valve?  no  Do you have a pacemaker?  no  Has patient ever had endocarditis? no  Has patient had joint replacement within last 12 months?  no  Does patient tend to be constipated or take laxatives? no  Does patient have a history of alcohol/drug use?  no  Is patient on Coumadin, Plavix and/or Aspirin? no  Medications: see epic  Allergies: see epic  Medication Adjustment:   Procedure date & time: 01/19/15 at 1055

## 2014-12-26 NOTE — Telephone Encounter (Signed)
agree

## 2015-01-19 ENCOUNTER — Ambulatory Visit (HOSPITAL_COMMUNITY)
Admission: RE | Admit: 2015-01-19 | Discharge: 2015-01-19 | Disposition: A | Discharge status: Home / Self Care | Payer: 59 | Source: Ambulatory Visit | Attending: Internal Medicine | Admitting: Internal Medicine

## 2015-01-19 ENCOUNTER — Encounter (HOSPITAL_COMMUNITY): Payer: Self-pay | Admitting: *Deleted

## 2015-01-19 ENCOUNTER — Encounter (HOSPITAL_COMMUNITY)
Admission: RE | Disposition: A | Discharge status: Home / Self Care | Payer: Self-pay | Source: Ambulatory Visit | Attending: Internal Medicine

## 2015-01-19 DIAGNOSIS — M109 Gout, unspecified: Secondary | ICD-10-CM | POA: Insufficient documentation

## 2015-01-19 DIAGNOSIS — Z1211 Encounter for screening for malignant neoplasm of colon: Secondary | ICD-10-CM | POA: Diagnosis present

## 2015-01-19 DIAGNOSIS — K648 Other hemorrhoids: Secondary | ICD-10-CM | POA: Diagnosis not present

## 2015-01-19 DIAGNOSIS — F419 Anxiety disorder, unspecified: Secondary | ICD-10-CM | POA: Insufficient documentation

## 2015-01-19 DIAGNOSIS — D125 Benign neoplasm of sigmoid colon: Secondary | ICD-10-CM | POA: Insufficient documentation

## 2015-01-19 DIAGNOSIS — I2510 Atherosclerotic heart disease of native coronary artery without angina pectoris: Secondary | ICD-10-CM | POA: Diagnosis not present

## 2015-01-19 DIAGNOSIS — Z79899 Other long term (current) drug therapy: Secondary | ICD-10-CM | POA: Insufficient documentation

## 2015-01-19 DIAGNOSIS — D123 Benign neoplasm of transverse colon: Secondary | ICD-10-CM | POA: Insufficient documentation

## 2015-01-19 DIAGNOSIS — K5730 Diverticulosis of large intestine without perforation or abscess without bleeding: Secondary | ICD-10-CM | POA: Insufficient documentation

## 2015-01-19 DIAGNOSIS — K644 Residual hemorrhoidal skin tags: Secondary | ICD-10-CM | POA: Diagnosis not present

## 2015-01-19 HISTORY — PX: COLONOSCOPY: SHX5424

## 2015-01-19 SURGERY — COLONOSCOPY
Anesthesia: Moderate Sedation

## 2015-01-19 MED ORDER — SODIUM CHLORIDE 0.9 % IV SOLN
INTRAVENOUS | Status: DC
  Administered 2015-01-19: 10:00:00 via INTRAVENOUS

## 2015-01-19 MED ORDER — MIDAZOLAM HCL 5 MG/5ML IJ SOLN
INTRAMUSCULAR | Status: AC
  Filled 2015-01-19: qty 10

## 2015-01-19 MED ORDER — MEPERIDINE HCL 50 MG/ML IJ SOLN
INTRAMUSCULAR | Status: AC
  Filled 2015-01-19: qty 1

## 2015-01-19 MED ORDER — STERILE WATER FOR IRRIGATION IR SOLN
Status: DC | PRN
  Administered 2015-01-19: 2.5 mL

## 2015-01-19 MED ORDER — MEPERIDINE HCL 50 MG/ML IJ SOLN
INTRAMUSCULAR | Status: DC | PRN
  Administered 2015-01-19 (×3): 25 mg via INTRAVENOUS

## 2015-01-19 MED ORDER — MIDAZOLAM HCL 5 MG/5ML IJ SOLN
INTRAMUSCULAR | Status: DC | PRN
  Administered 2015-01-19: 2 mg via INTRAVENOUS
  Administered 2015-01-19 (×2): 3 mg via INTRAVENOUS
  Administered 2015-01-19: 2 mg via INTRAVENOUS

## 2015-01-19 NOTE — Op Note (Addendum)
COLONOSCOPY PROCEDURE REPORT  PATIENT:  Peter Burch  MR#:  [id number] Birthdate:  08-Nov-1955, 60 y.o., male Endoscopist:  Dr. Rogene Houston, MD Referred By:  Dr. Purvis Kilts, MD  Procedure Date: 01/19/2015  Procedure:   Colonoscopy with snare polypectomy  Indications:  Patient 60 year old Caucasian male was undergoing average risk screening colonoscopy. Last exam was in 2006.  Informed Consent:  The procedure and risks were reviewed with the patient and informed consent was obtained.  Medications:  Demerol 75 mg IV Versed 10 mg IV  First dose administered at 10:39 AM Last dose administered at 10: 50 a.m. Scope out 11:07 AM   Description of procedure:  After a digital rectal exam was performed, that colonoscope was advanced from the anus through the rectum and colon to the area of the cecum, ileocecal valve and appendiceal orifice. The cecum was deeply intubated. These structures were well-seen and photographed for the record. From the level of the cecum and ileocecal valve, the scope was slowly and cautiously withdrawn. The mucosal surfaces were carefully surveyed utilizing scope tip to flexion to facilitate fold flattening as needed. The scope was pulled down into the rectum where a thorough exam including retroflexion was performed.  Findings:   Prep excellent. Single small diverticulum noted at hepatic flexure. 4 mm polyp cold snared from splenic flexure. 5 mm polyp hot snared from proximal sigmoid colon. Normal rectal mucosa. Small hemorrhoids below the dentate line.   Therapeutic/Diagnostic Maneuvers Performed:  See above  Complications:  None  EBL:  Minimal  Cecal Withdrawal Time:  13 minutes  Impression:  Examination performed to cecum. Single small diverticulum at hepatic flexure. 4 mm polyp cold snared from splenic flexure. 5 mm polyp hot snare from proximal sigmoid colon. Both of these polyps were submitted together. Small external  hemorrhoids.  Recommendations:  Standard instructions given. No aspirin or NSAIDs for 1 week. I will contact patient with biopsy results and further recommendations.  REHMAN,NAJEEB U  01/19/2015 11:13 AM  CC: Dr. Hilma Favors, Betsy Coder, MD & Dr. Rayne Du ref. provider found

## 2015-01-19 NOTE — H&P (Signed)
Peter Burch is an 60 y.o. male.   Chief Complaint: Patient is here for colonoscopy. HPI: Patient is 60 year old Caucasian male who is here for screening colonoscopy. He denies abdominal pain change in bowel habits or rectal bleeding. Last exam was in 2006. Family history is negative for CRC.  Past Medical History  Diagnosis Date  . CAD (coronary artery disease)   . Sinus bradycardia   . Gout     Past Surgical History  Procedure Laterality Date  . Cardiac catheterization  05/16/2006    40% LAD lesion  . US echocardiography  07/31/2009    impaired LV relaxation  . Nm myocar perf wall motion  07/31/2009    mod to severe defect due to infarct/scar w/mild perinfarct ischemia    Family History  Problem Relation Age of Onset  . Heart attack Mother   . Cancer Father     lung   Social History:  reports that he has never smoked. He has never used smokeless tobacco. He reports that he drinks alcohol. He reports that he does not use illicit drugs.  Allergies:  Allergies  Allergen Reactions  . Statins     Myalgias Crestor,vytorin,livalo    Medications Prior to Admission  Medication Sig Dispense Refill  . ALPRAZolam (XANAX) 0.5 MG tablet Take 1 tablet by mouth daily as needed for anxiety.     . colchicine 0.6 MG tablet Take 0.6 mg by mouth daily as needed (gout).     Marland Kitchen ezetimibe (ZETIA) 10 MG tablet Take 1 tablet (10 mg total) by mouth daily. 30 tablet 5  . fluticasone (FLONASE) 50 MCG/ACT nasal spray Place 1 spray into the nose as needed for allergies.     . polyethylene glycol-electrolytes (NULYTELY/GOLYTELY) 420 G solution Take 4,000 mLs by mouth once. 4000 mL 0  . ULORIC 80 MG TABS Take 80 mg by mouth daily.      No results found for this or any previous visit (from the past 48 hour(s)). No results found.  ROS  Blood pressure 118/68, pulse 62, temperature 98 F (36.7 C), temperature source Oral, resp. rate 18, height 6\' 3"  (1.905 m), weight 234 lb (106.142 kg), SpO2 86  %. Physical Exam  Constitutional: He appears well-developed and well-nourished.  HENT:  Mouth/Throat: Oropharynx is clear and moist.  Eyes: Conjunctivae are normal. No scleral icterus.  Neck: No thyromegaly present.  Cardiovascular: Normal rate, regular rhythm and normal heart sounds.   No murmur heard. Respiratory: Effort normal and breath sounds normal.  GI: Soft. He exhibits no distension and no mass. There is no tenderness.  Musculoskeletal: He exhibits no edema.  Lymphadenopathy:    He has no cervical adenopathy.  Neurological: He is alert.  Skin: Skin is warm and dry.     Assessment/Plan Average risk screening colonoscopy.  Peter Burch U 01/19/2015, 10:35 AM

## 2015-01-19 NOTE — Discharge Instructions (Signed)
No aspirin or NSAIDs for 1 week. Resume usual medications and diet. No driving for 24 hours. Physician will call with biopsy results.   Colon Polyps Polyps are lumps of extra tissue growing inside the body. Polyps can grow in the large intestine (colon). Most colon polyps are noncancerous (benign). However, some colon polyps can become cancerous over time. Polyps that are larger than a pea may be harmful. To be safe, caregivers remove and test all polyps. CAUSES  Polyps form when mutations in the genes cause your cells to grow and divide even though no more tissue is needed. RISK FACTORS There are a number of risk factors that can increase your chances of getting colon polyps. They include:  Being older than 50 years.  Family history of colon polyps or colon cancer.  Long-term colon diseases, such as colitis or Crohn disease.  Being overweight.  Smoking.  Being inactive.  Drinking too much alcohol. SYMPTOMS  Most small polyps do not cause symptoms. If symptoms are present, they may include:  Blood in the stool. The stool may look dark red or black.  Constipation or diarrhea that lasts longer than 1 week. DIAGNOSIS People often do not know they have polyps until their caregiver finds them during a regular checkup. Your caregiver can use 4 tests to check for polyps:  Digital rectal exam. The caregiver wears gloves and feels inside the rectum. This test would find polyps only in the rectum.  Barium enema. The caregiver puts a liquid called barium into your rectum before taking X-rays of your colon. Barium makes your colon look white. Polyps are dark, so they are easy to see in the X-ray pictures.  Sigmoidoscopy. A thin, flexible tube (sigmoidoscope) is placed into your rectum. The sigmoidoscope has a light and tiny camera in it. The caregiver uses the sigmoidoscope to look at the last third of your colon.  Colonoscopy. This test is like sigmoidoscopy, but the caregiver looks at  the entire colon. This is the most common method for finding and removing polyps. TREATMENT  Any polyps will be removed during a sigmoidoscopy or colonoscopy. The polyps are then tested for cancer. PREVENTION  To help lower your risk of getting more colon polyps:  Eat plenty of fruits and vegetables. Avoid eating fatty foods.  Do not smoke.  Avoid drinking alcohol.  Exercise every day.  Lose weight if recommended by your caregiver.  Eat plenty of calcium and folate. Foods that are rich in calcium include milk, cheese, and broccoli. Foods that are rich in folate include chickpeas, kidney beans, and spinach. HOME CARE INSTRUCTIONS Keep all follow-up appointments as directed by your caregiver. You may need periodic exams to check for polyps. SEEK MEDICAL CARE IF: You notice bleeding during a bowel movement.   This information is not intended to replace advice given to you by your health care provider. Make sure you discuss any questions you have with your health care provider.   Document Released: 09/20/2003 Document Revised: 01/14/2014 Document Reviewed: 03/05/2011 Elsevier Interactive Patient Education 2016 [employer]. Colonoscopy, Care After These instructions give you information on caring for yourself after your procedure. Your doctor may also give you more specific instructions. Call your doctor if you have any problems or questions after your procedure. HOME CARE  Do not drive for 24 hours.  Do not sign important papers or use machinery for 24 hours.  You may shower.  You may go back to your usual activities, but go slower for the first  24 hours.  Take rest breaks often during the first 24 hours.  Walk around or use warm packs on your belly (abdomen) if you have belly cramping or gas.  Drink enough fluids to keep your pee (urine) clear or pale yellow.  Resume your normal diet. Avoid heavy or fried foods.  Avoid drinking alcohol for 24 hours or as told by your  doctor.  Only take medicines as told by your doctor. If a tissue sample (biopsy) was taken during the procedure:   Do not take aspirin or blood thinners for 7 days, or as told by your doctor.  Do not drink alcohol for 7 days, or as told by your doctor.  Eat soft foods for the first 24 hours. GET HELP IF: You still have a small amount of blood in your poop (stool) 2-3 days after the procedure. GET HELP RIGHT AWAY IF:  You have more than a small amount of blood in your poop.  You see clumps of tissue (blood clots) in your poop.  Your belly is puffy (swollen).  You feel sick to your stomach (nauseous) or throw up (vomit).  You have a fever.  You have belly pain that gets worse and medicine does not help. MAKE SURE YOU:  Understand these instructions.  Will watch your condition.  Will get help right away if you are not doing well or get worse.   This information is not intended to replace advice given to you by your health care provider. Make sure you discuss any questions you have with your health care provider.   Document Released: 01/26/2010 Document Revised: 12/29/2012 Document Reviewed: 08/31/2012 Elsevier Interactive Patient Education [employer].

## 2015-01-23 ENCOUNTER — Encounter (HOSPITAL_COMMUNITY): Payer: Self-pay | Admitting: Internal Medicine

## 2015-01-25 ENCOUNTER — Other Ambulatory Visit: Payer: Self-pay | Admitting: Cardiovascular Disease

## 2015-01-25 NOTE — Telephone Encounter (Signed)
Rx request sent to pharmacy.  

## 2015-10-18 ENCOUNTER — Ambulatory Visit (INDEPENDENT_AMBULATORY_CARE_PROVIDER_SITE_OTHER): Payer: 59 | Admitting: Rheumatology

## 2015-10-18 DIAGNOSIS — M19041 Primary osteoarthritis, right hand: Secondary | ICD-10-CM | POA: Diagnosis not present

## 2015-10-18 DIAGNOSIS — M1A00X0 Idiopathic chronic gout, unspecified site, without tophus (tophi): Secondary | ICD-10-CM | POA: Diagnosis not present

## 2015-10-18 DIAGNOSIS — E669 Obesity, unspecified: Secondary | ICD-10-CM | POA: Diagnosis not present

## 2015-10-18 DIAGNOSIS — R6889 Other general symptoms and signs: Secondary | ICD-10-CM

## 2015-10-20 ENCOUNTER — Ambulatory Visit (INDEPENDENT_AMBULATORY_CARE_PROVIDER_SITE_OTHER): Payer: 59 | Admitting: Cardiovascular Disease

## 2015-10-20 ENCOUNTER — Encounter: Payer: Self-pay | Admitting: Cardiovascular Disease

## 2015-10-20 VITALS — BP 114/72 | HR 70 | Ht 75.0 in | Wt 232.8 lb

## 2015-10-20 DIAGNOSIS — Z79899 Other long term (current) drug therapy: Secondary | ICD-10-CM | POA: Diagnosis not present

## 2015-10-20 DIAGNOSIS — E785 Hyperlipidemia, unspecified: Secondary | ICD-10-CM | POA: Diagnosis not present

## 2015-10-20 DIAGNOSIS — I2510 Atherosclerotic heart disease of native coronary artery without angina pectoris: Secondary | ICD-10-CM | POA: Diagnosis not present

## 2015-10-20 NOTE — Progress Notes (Signed)
Cardiology Office Note    Date:  10/20/2015   ID:  Peter PATCHETT, DOB May 11, 1955, MRN [id number]  PCP:  Purvis Kilts, MD  Cardiologist:   Sanda Klein, MD   Chief complaint: Yearly follow-up.   History of Present Illness:  Peter Burch is a 60 y.o. male with known mild coronary artery disease (40% proximal LAD calcified lesion by cath 2008), without symptoms of angina pectoris and with a low risk nuclear stress test in 2011. He is known to have an inferior wall defect on nuclear stress test that is a false positive abnormality (this actually led to the heart cath in 2008). He has hyperlipidemia but has been unable to tolerate numerous statins. He does take Zetia without side effects. Since his last appointment he has not had any problems with angina or dyspnea. Unfortunately he has been less physically active. He has only walked his usual [place] once every 2 or 3 weeks. He had some problems with his knee that slowed him down and is preoccupied remodeling an old car. He has noticed that he has become a little deconditioned. He has gained a little weight and his waistline has increased by 1 inch. He denies exertional dyspnea, claudication, ankle swelling, focal neurological complaints or other cardiovascular symptoms.    Past Medical History:  Diagnosis Date  . CAD (coronary artery disease)   . Gout   . Sinus bradycardia     Past Surgical History:  Procedure Laterality Date  . CARDIAC CATHETERIZATION  05/16/2006   40% LAD lesion  . COLONOSCOPY N/A 01/19/2015   Procedure: COLONOSCOPY;  Surgeon: Peter Houston, MD;  [facility];  Service: Endoscopy;  Laterality: N/A;  1030  . NM MYOCAR PERF WALL MOTION  07/31/2009   mod to severe defect due to infarct/scar w/mild perinfarct ischemia  . US ECHOCARDIOGRAPHY  07/31/2009   impaired LV relaxation    Current Medications: Outpatient Medications Prior to Visit  Medication Sig Dispense Refill  .  ALPRAZolam (XANAX) 0.5 MG tablet Take 1 tablet by mouth daily as needed for anxiety.     . colchicine 0.6 MG tablet Take 0.6 mg by mouth daily as needed (gout).     . fluticasone (FLONASE) 50 MCG/ACT nasal spray Place 1 spray into the nose as needed for allergies.     Marland Kitchen ULORIC 80 MG TABS Take 80 mg by mouth daily.    Marland Kitchen ZETIA 10 MG tablet TAKE ONE TABLET BY MOUTH ONCE DAILY. 30 tablet 9   No facility-administered medications prior to visit.      Allergies:   Statins   Social History   Social History  . Marital status: Married    Spouse name: N/A  . Number of children: N/A  . Years of education: N/A   Social History Main Topics  . Smoking status: Never Smoker  . Smokeless tobacco: Never Used  . Alcohol use Yes     Comment: social, occasionally  . Drug use: No  . Sexual activity: Not Asked   Other Topics Concern  . None   Social History Narrative  . None     Family History:  The patient's family history includes Cancer in his father; Heart attack in his mother.   ROS:   Please see the history of present illness.    ROS All other systems reviewed and are negative.   PHYSICAL EXAM:   VS:  BP 114/72   Pulse 70   Ht  6\' 3"  (1.905 m)   Wt 232 lb 12.8 oz (105.6 kg)   BMI 29.10 kg/m    GEN: Well nourished, well developed, in no acute distress  HEENT: normal  Neck: no JVD, carotid bruits, or masses Cardiac: RRR; no murmurs, rubs, or gallops,no edema  Respiratory:  clear to auscultation bilaterally, normal work of breathing GI: soft, nontender, nondistended, + BS MS: no deformity or atrophy  Skin: warm and dry, no rash Neuro:  Alert and Oriented x 3, Strength and sensation are intact Psych: euthymic mood, full affect  Wt Readings from Last 3 Encounters:  10/20/15 232 lb 12.8 oz (105.6 kg)  01/19/15 234 lb (106.1 kg)  10/24/14 234 lb 14.4 oz (106.5 kg)      Studies/Labs Reviewed:   EKG:  EKG is ordered today.  The ekg ordered today demonstrates Sinus rhythm with  a single PVC, QTC 432 ms  Recent Labs: No results found for requested labs within last 8760 hours.   Lipid Panel    Component Value Date/Time   CHOL 174 03/15/2014 0924   TRIG 121 03/15/2014 0924   HDL 38 (L) 03/15/2014 0924   CHOLHDL 4.6 03/15/2014 0924   VLDL 24 03/15/2014 0924   LDLCALC 112 (H) 03/15/2014 0924      ASSESSMENT:    1. Atherosclerosis of coronary artery of native heart without angina pectoris, unspecified vessel or lesion type   2. Dyslipidemia   3. Medication management      PLAN:  In order of problems listed above:  1. CAD: He had only a minor coronary lesion of the time of his remote cardiac catheterization, has normal left ventricular systolic function, no angina pectoris and a low risk functional study in 2011. The focus remains on risk factor modification. 2. HLP: Time to repeat his lipid profile soon. He has a follow up appointment with Dr. Hilma Favors in a couple of weeks. Target LDL should be less than 100, but we are unlikely to achieve that with Zetia only. He definitely needs to restart exercising regularly and try to lose weight. He is approaching obesity. If his knee slows him down, strongly encourage him to consider alternative ways of exercise such as swimming, bike riding, etc.    Medication Adjustments/Labs and Tests Ordered: Current medicines are reviewed at length with the patient today.  Concerns regarding medicines are outlined above.  Medication changes, Labs and Tests ordered today are listed in the Patient Instructions below. Patient Instructions  Medication Instructions: Dr Sallyanne Kuster recommends that you continue on your current medications as directed. Please refer to the Current Medication list given to you today.  Labwork: Your physician recommends that you return for lab work at your earliest [place].  Testing/Procedures: NONE ORDERED  Follow-up: Dr Sallyanne Kuster recommends that you schedule a follow-up appointment in 1  year. You will receive a reminder letter in the mail two months in advance. If you don't receive a letter, please call our office to schedule the follow-up appointment.  If you need a refill on your cardiac medications before your next appointment, please call your pharmacy.    Signed, Sanda Klein, MD  10/20/2015 11:54 AM    [facility] [place], [place], [place]  [address] Phone: [phone]; Fax: [phone]

## 2015-10-20 NOTE — Patient Instructions (Signed)
Medication Instructions: Dr Croitoru recommends that you continue on your current medications as directed. Please refer to the Current Medication list given to you today.  Labwork: Your physician recommends that you return for lab work at your earliest convenience - FASTING.  Testing/Procedures: NONE ORDERED  Follow-up: Dr Croitoru recommends that you schedule a follow-up appointment in 1 year. You will receive a reminder letter in the mail two months in advance. If you don't receive a letter, please call our office to schedule the follow-up appointment.  If you need a refill on your cardiac medications before your next appointment, please call your pharmacy. 

## 2015-10-30 ENCOUNTER — Ambulatory Visit: Payer: 59 | Admitting: Cardiovascular Disease

## 2015-12-26 ENCOUNTER — Other Ambulatory Visit: Payer: Self-pay | Admitting: Cardiovascular Disease

## 2016-01-15 ENCOUNTER — Encounter: Payer: Self-pay | Admitting: Rheumatology

## 2016-01-15 ENCOUNTER — Ambulatory Visit (INDEPENDENT_AMBULATORY_CARE_PROVIDER_SITE_OTHER): Payer: 59 | Admitting: Rheumatology

## 2016-01-15 VITALS — BP 120/78 | HR 66 | Resp 16 | Ht 75.0 in | Wt 241.0 lb

## 2016-01-15 DIAGNOSIS — E782 Mixed hyperlipidemia: Secondary | ICD-10-CM | POA: Diagnosis not present

## 2016-01-15 DIAGNOSIS — M25561 Pain in right knee: Secondary | ICD-10-CM | POA: Diagnosis not present

## 2016-01-15 DIAGNOSIS — M1A09X0 Idiopathic chronic gout, multiple sites, without tophus (tophi): Secondary | ICD-10-CM

## 2016-01-15 DIAGNOSIS — I2510 Atherosclerotic heart disease of native coronary artery without angina pectoris: Secondary | ICD-10-CM | POA: Diagnosis not present

## 2016-01-15 DIAGNOSIS — Z5181 Encounter for therapeutic drug level monitoring: Secondary | ICD-10-CM | POA: Diagnosis not present

## 2016-01-15 MED ORDER — TRIAMCINOLONE ACETONIDE 40 MG/ML IJ SUSP
40.0000 mg | INTRAMUSCULAR | Status: AC | PRN
  Administered 2016-01-15: 40 mg via INTRA_ARTICULAR

## 2016-01-15 MED ORDER — LIDOCAINE HCL 1 % IJ SOLN
1.5000 mL | INTRAMUSCULAR | Status: AC | PRN
  Administered 2016-01-15: 1.5 mL

## 2016-01-15 MED ORDER — ALLOPURINOL 300 MG PO TABS: 450.0000 mg | ORAL_TABLET | Freq: Every day | ORAL | 1 refills | Status: DC

## 2016-01-15 NOTE — Progress Notes (Signed)
Office Visit Note  Patient: Peter Burch             Date of Birth: March 12, 1955           MRN: [id number]             PCP: Purvis Kilts, MD Referring: Sharilyn Sites, MD Visit Date: 01/15/2016 Occupation: _0 @    Subjective:  Right knee pain   History of Present Illness: Peter Burch is a 61 y.o. male with history of chronic gout. He states that he has been walking on a regular basis for the last. About a week ago his right knee started hurting. He denies any swelling or warmth. He does not feel like typical gout flare. No other joints are painful. He's been taking Uloric on a regular basis.  Activities of Daily Living:  Patient reports morning stiffness for 0 minute.   Patient Reports nocturnal pain.  Difficulty dressing/grooming: Denies Difficulty climbing stairs: Reports Difficulty getting out of chair: Denies Difficulty using hands for taps, buttons, cutlery, and/or writing: Denies   Review of Systems  Constitutional: Negative for fatigue, night sweats and weakness ( ).  HENT: Negative for mouth sores, mouth dryness and nose dryness.   Eyes: Negative for redness and dryness.  Respiratory: Negative for shortness of breath and difficulty breathing.   Cardiovascular: Negative for chest pain, palpitations, hypertension, irregular heartbeat and swelling in legs/feet.  Gastrointestinal: Negative for constipation and diarrhea.  Endocrine: Negative for increased urination.  Musculoskeletal: Positive for arthralgias and joint pain. Negative for joint swelling, myalgias, muscle weakness, morning stiffness, muscle tenderness and myalgias.  Skin: Negative for color change, rash, hair loss, nodules/bumps, skin tightness, ulcers and sensitivity to sunlight.  Allergic/Immunologic: Negative for susceptible to infections.  Neurological: Negative for dizziness, fainting, memory loss and night sweats.  Hematological: Negative for swollen glands.  Psychiatric/Behavioral:  Positive for sleep disturbance. Negative for depressed mood. The patient is not nervous/anxious.     PMFS History:  Patient Active Problem List   Diagnosis Date Noted  . Idiopathic chronic gout of multiple sites without tophus 01/15/2016  . Mixed hyperlipidemia 09/01/2012  . Atherosclerosis of coronary artery of native heart without angina pectoris 09/01/2012    Past Medical History:  Diagnosis Date  . CAD (coronary artery disease)   . Gout   . Sinus bradycardia     Family History  Problem Relation Age of Onset  . Heart attack Mother   . Cancer Father     lung   Past Surgical History:  Procedure Laterality Date  . CARDIAC CATHETERIZATION  05/16/2006   40% LAD lesion  . COLONOSCOPY N/A 01/19/2015   Procedure: COLONOSCOPY;  Surgeon: Rogene Houston, MD;  Location: [facility];  Service: Endoscopy;  Laterality: N/A;  1030  . NM MYOCAR PERF WALL MOTION  07/31/2009   mod to severe defect due to infarct/scar w/mild perinfarct ischemia  . US ECHOCARDIOGRAPHY  07/31/2009   impaired LV relaxation   Social History   Social History Narrative  . No narrative on file     Objective: Vital Signs: BP 120/78   Pulse 66   Resp 16   Ht _1  (1.905 m)   Wt 241 lb (109.3 kg)   BMI 30.12 kg/m    Physical Exam  Constitutional: He is oriented to person, place, and time. He appears well-developed and well-nourished.  HENT:  Head: Normocephalic and atraumatic.  Eyes: Conjunctivae and EOM are normal. Pupils are equal, round, and  reactive to light.  Neck: Normal range of motion. Neck supple.  Cardiovascular: Normal rate, regular rhythm and normal heart sounds.   Pulmonary/Chest: Effort normal and breath sounds normal.  Abdominal: Soft. Bowel sounds are normal.  Neurological: He is alert and oriented to person, place, and time.  Skin: Skin is warm and dry. Capillary refill takes less than 2 seconds.  Psychiatric: He has a normal mood and affect. His behavior is normal.  Nursing note and  vitals reviewed.    Musculoskeletal Exam: C-spine, thoracic, lumbar spine good range of motion. Good range of motion of her shoulders elbows wrist joints he some thickening of PIP/DIP joints in his hands consistent with osteoarthritis. Hip joints knee joints ankles MTPs PIPs with good range of motion with no synovitis. Hip discomfort and pain with range of motion of his right knee joint.  CDAI Exam: No CDAI exam completed.    Investigation: Findings:  On August 21, 2009, CBC, comprehensive metabolic panel, sed rate, CK, rheumatoid factor, ANA, anti-CCP, and HLA-B27 were all negative.  Uric acid was 7.8.    10/18/2015 CBC CMP normal Uric Acid 3.8    Imaging: No results found.  Speciality Comments: No specialty comments available.    Procedures:  Large Joint Inj Date/Time: 01/15/2016 12:01 PM Performed by: Bo Merino Authorized by: Bo Merino   Consent Given by:  Patient Site marked: the procedure site was marked   Timeout: prior to procedure the correct patient, procedure, and site was verified   Indications:  Pain and joint swelling Location:  Knee Site:  R knee Prep: patient was prepped and draped in usual sterile fashion   Needle Size:  27 G Needle Length:  1.5 inches Approach:  Medial Ultrasound Guidance: No   Fluoroscopic Guidance: No   Arthrogram: No   Medications:  1.5 mL lidocaine 1 %; 40 mg triamcinolone acetonide 40 MG/ML Aspiration Attempted: Yes   Aspirate amount (mL):  0 Patient tolerance:  Patient tolerated the procedure well with no immediate complications   Allergies: Statins   Assessment / Plan:     Visit Diagnoses: Idiopathic chronic gout of multiple sites without tophus: His gout has been well controlled without any recent flares. His uric acid isn't desirable range.  Acute pain of right knee: His right knee joint is started hurting more after his some recent walking. He does not have any warmth or effusion. Different treatment  options were discussed and side effects were reviewed after informed consent was obtained the right knee joint was injected with cortisone as described above. He tolerated the procedure well.  He does have some coronary artery disease. We reviewed the side effects of Uloric today because the recent study off some increase association of Uloric with heart disease we decided to switch him to allopurinol. He was in agreement. Indications side effects contraindications were discussed. He was given allopurinol 300 mg 1-1/2 tablet by mouth daily 90 day supply with one refill was given. I'll check his labs and 2 months.  Mixed hyperlipidemia  Atherosclerosis of coronary artery of native heart without angina pectoris, unspecified vessel or lesion type  Medication monitoring encounter - Plan: CBC with Differential/Platelet, COMPLETE METABOLIC PANEL WITH GFR, Uric acid    Orders: Orders Placed This Encounter  Procedures  . Large Joint Injection/Arthrocentesis  . Large Joint Injection/Arthrocentesis  . CBC with Differential/Platelet  . COMPLETE METABOLIC PANEL WITH GFR  . Uric acid   Meds ordered this encounter  Medications  . allopurinol (ZYLOPRIM) 300 MG  tablet    Sig: Take 1.5 tablets (450 mg total) by mouth daily.    Dispense:  135 tablet    Refill:  1    Face-to-face time spent with patient was 30 minutes. 50% of time was spent in counseling and coordination of care.  Follow-Up Instructions: Return in about 6 months (around 07/14/2016) for Gout.   Bo Merino, MD  Note - This record has been created using Editor, commissioning.  Chart creation errors have been sought, but may not always  have been located. Such creation errors do not reflect on  the standard of medical care.

## 2016-01-15 NOTE — Patient Instructions (Signed)
CBC, CMP with GFR, uric acid March 2018

## 2016-02-05 DIAGNOSIS — N29 Other disorders of kidney and ureter in diseases classified elsewhere: Secondary | ICD-10-CM

## 2016-02-05 DIAGNOSIS — Z8679 Personal history of other diseases of the circulatory system: Secondary | ICD-10-CM | POA: Insufficient documentation

## 2016-02-05 DIAGNOSIS — M19042 Primary osteoarthritis, left hand: Secondary | ICD-10-CM

## 2016-02-05 DIAGNOSIS — M19041 Primary osteoarthritis, right hand: Secondary | ICD-10-CM | POA: Insufficient documentation

## 2016-02-05 DIAGNOSIS — E785 Hyperlipidemia, unspecified: Secondary | ICD-10-CM | POA: Insufficient documentation

## 2016-02-05 NOTE — Progress Notes (Signed)
Office Visit Note  Patient: Peter Burch             Date of Birth: 1955-07-13           MRN: [id number]             PCP: Purvis Kilts, MD Referring: Sharilyn Sites, MD Visit Date: 02/07/2016 Occupation: @GUAROCC @    Subjective:  Right knee pain.   History of Present Illness: Peter Burch is a 61 y.o. male with history of gouty arthropathy. He had his right knee joint injected in January 1 week. He states he did better after the injection but is still had 50% symptoms left.. Last week when he has no he was shoveling the snow and was working for some time outside. After this episode his right knee joint pain recurred but since Monday the symptoms have eased off to some extent. He denies any joint swelling.  Activities of Daily Living:  Patient reports morning stiffness for 10 minutes.   Patient Denies nocturnal pain.  Difficulty dressing/grooming: Denies Difficulty climbing stairs: Reports Difficulty getting out of chair: Denies Difficulty using hands for taps, buttons, cutlery, and/or writing: Denies   Review of Systems  Constitutional: Negative for fatigue, night sweats and weakness ( ).  HENT: Negative for mouth sores, mouth dryness and nose dryness.   Eyes: Negative for redness and dryness.  Respiratory: Negative for shortness of breath and difficulty breathing.   Cardiovascular: Negative for chest pain, palpitations, hypertension, irregular heartbeat and swelling in legs/feet.  Gastrointestinal: Negative for constipation and diarrhea.  Endocrine: Negative for increased urination.  Musculoskeletal: Positive for arthralgias and joint pain. Negative for joint swelling, myalgias, muscle weakness, morning stiffness, muscle tenderness and myalgias.  Skin: Negative for color change, rash, hair loss, nodules/bumps, skin tightness, ulcers and sensitivity to sunlight.  Allergic/Immunologic: Negative for susceptible to infections.  Neurological: Negative for dizziness,  fainting, memory loss and night sweats.  Hematological: Negative for swollen glands.  Psychiatric/Behavioral: Negative for depressed mood and sleep disturbance. The patient is not nervous/anxious.     PMFS History:  Patient Active Problem List   Diagnosis Date Noted  . Chronic pain of right knee 02/07/2016  . History of coronary artery disease 02/05/2016  . Primary osteoarthritis of both hands 02/05/2016  . Dyslipidemia 02/05/2016  . Renal calcinosis 02/05/2016  . Idiopathic chronic gout of multiple sites without tophus 01/15/2016  . Mixed hyperlipidemia 09/01/2012  . Atherosclerosis of coronary artery of native heart without angina pectoris 09/01/2012    Past Medical History:  Diagnosis Date  . CAD (coronary artery disease)   . Gout   . Sinus bradycardia     Family History  Problem Relation Age of Onset  . Heart attack Mother   . Cancer Father     lung   Past Surgical History:  Procedure Laterality Date  . CARDIAC CATHETERIZATION  05/16/2006   40% LAD lesion  . COLONOSCOPY N/A 01/19/2015   Procedure: COLONOSCOPY;  Surgeon: Rogene Houston, MD;  Location: [facility];  Service: Endoscopy;  Laterality: N/A;  1030  . NM MYOCAR PERF WALL MOTION  07/31/2009   mod to severe defect due to infarct/scar w/mild perinfarct ischemia  . US ECHOCARDIOGRAPHY  07/31/2009   impaired LV relaxation   Social History   Social History Narrative  . No narrative on file     Objective: Vital Signs: BP 115/78 (BP Location: Left Arm, Patient Position: Sitting, Cuff Size: Normal)   Pulse 93  Resp 13   Ht 6\' 3"  (1.905 m)   Wt 233 lb (105.7 kg)   BMI 29.12 kg/m    Physical Exam  Constitutional: He is oriented to person, place, and time. He appears well-developed and well-nourished.  HENT:  Head: Normocephalic and atraumatic.  Eyes: Conjunctivae and EOM are normal. Pupils are equal, round, and reactive to light.  Neck: Normal range of motion. Neck supple.  Cardiovascular: Normal rate,  regular rhythm and normal heart sounds.   Pulmonary/Chest: Effort normal and breath sounds normal.  Abdominal: Soft. Bowel sounds are normal.  Neurological: He is alert and oriented to person, place, and time.  Skin: Skin is warm and dry. Capillary refill takes less than 2 seconds.  Psychiatric: He has a normal mood and affect. His behavior is normal.  Nursing note and vitals reviewed.    Musculoskeletal Exam: C-spine, thoracic, lumbar spine good range of motion. Shoulder joints elbow joints wrist joints are good range of motion. He had DIP thickening consistent with osteoarthritis. Knee joints ankle joints hip joints are good range of motion. He did have some swelling and warmth in his right knee joint without any effusion.  CDAI Exam: No CDAI exam completed.    Investigation: Findings:  10/18/2015 labs, show CMP with GFR normal.  CBC with diff is normal.  Uric acid is normal at 3.8 11/07/2014 X-rays of bilateral knees, 2 views, show moderate medial compartment narrowing.  There is no change from the last x-ray.  05/01/2015 CBC normal, CMP normal, uric acid 4.1     Imaging: No results found.  Speciality Comments: No specialty comments available.    Procedures:  No procedures performed Allergies: Statins   Assessment / Plan:     Visit Diagnoses: Idiopathic chronic gout of multiple sites without tophus - History of hyperuricemia. He was switched from Uloric to allopurinol last visit. He states his some symptoms completely did not resolve after the cortisone injection and flared after shoveling the snow. He still has some warmth in his right knee joint. I've advised him to continue allopurinol and also add colchicine 1 tablet a day.  Chronic pain of right knee: Some would avoid cortisone injection at this point as he had recent cortisone injection. Have given him a prescription for Voltaren gel that can be used topically.  Primary osteoarthritis of both hands: Joint protection  and muscle strengthening was discussed. I've also given him a list of some natural anti-inflammatories.  His other medical problems are listed as follows:  History of coronary artery disease  Dyslipidemia  Renal calcinosis    Orders: No orders of the defined types were placed in this encounter.  Meds ordered this encounter  Medications  . diclofenac sodium (VOLTAREN) 1 % GEL    Sig: Apply 2 g topically 4 (four) times daily.    Dispense:  3 Tube    Refill:  1   If his symptoms persist she supposed to notify me. Face-to-face time spent with patient was 25 minutes. 50% of time was spent in counseling and coordination of care.  Follow-Up Instructions: Return in about 4 months (around 06/06/2016) for Gout.   Bo Merino, MD  Note - This record has been created using Editor, commissioning.  Chart creation errors have been sought, but may not always  have been located. Such creation errors do not reflect on  the standard of medical care.

## 2016-02-07 ENCOUNTER — Ambulatory Visit (INDEPENDENT_AMBULATORY_CARE_PROVIDER_SITE_OTHER): Payer: 59 | Admitting: Rheumatology

## 2016-02-07 ENCOUNTER — Encounter: Payer: Self-pay | Admitting: Rheumatology

## 2016-02-07 VITALS — BP 115/78 | HR 93 | Resp 13 | Ht 75.0 in | Wt 233.0 lb

## 2016-02-07 DIAGNOSIS — M1A09X0 Idiopathic chronic gout, multiple sites, without tophus (tophi): Secondary | ICD-10-CM | POA: Diagnosis not present

## 2016-02-07 DIAGNOSIS — M19042 Primary osteoarthritis, left hand: Secondary | ICD-10-CM

## 2016-02-07 DIAGNOSIS — E785 Hyperlipidemia, unspecified: Secondary | ICD-10-CM

## 2016-02-07 DIAGNOSIS — M19041 Primary osteoarthritis, right hand: Secondary | ICD-10-CM | POA: Diagnosis not present

## 2016-02-07 DIAGNOSIS — Z8679 Personal history of other diseases of the circulatory system: Secondary | ICD-10-CM

## 2016-02-07 DIAGNOSIS — N29 Other disorders of kidney and ureter in diseases classified elsewhere: Secondary | ICD-10-CM | POA: Diagnosis not present

## 2016-02-07 DIAGNOSIS — G8929 Other chronic pain: Secondary | ICD-10-CM

## 2016-02-07 DIAGNOSIS — M25561 Pain in right knee: Secondary | ICD-10-CM | POA: Diagnosis not present

## 2016-02-07 MED ORDER — DICLOFENAC SODIUM 1 % TD GEL: 2.0000 g | Freq: Four times a day (QID) | TRANSDERMAL | 1 refills | Status: DC

## 2016-02-07 NOTE — Patient Instructions (Signed)
Supplements for OA Natural anti-inflammatories  You can purchase these at [employer], [employer] or online.  . Turmeric (capsules)  . Ginger (ginger root or capsules)  . Omega 3 (Fish, flax seeds, chia seeds, walnuts, almonds)  . Tart cherry (dried or extract)   Patient should be under the care of a physician while taking these supplements. This may not be reproduced without the permission of Dr. Kallyn Demarcus.  

## 2016-02-12 ENCOUNTER — Other Ambulatory Visit: Payer: Self-pay | Admitting: Cardiovascular Disease

## 2016-03-18 ENCOUNTER — Other Ambulatory Visit: Payer: Self-pay | Admitting: Cardiovascular Disease

## 2016-04-15 ENCOUNTER — Telehealth: Payer: Self-pay | Admitting: Rheumatology

## 2016-04-15 ENCOUNTER — Other Ambulatory Visit: Payer: Self-pay | Admitting: *Deleted

## 2016-04-15 DIAGNOSIS — Z79899 Other long term (current) drug therapy: Secondary | ICD-10-CM | POA: Diagnosis not present

## 2016-04-15 DIAGNOSIS — M2550 Pain in unspecified joint: Secondary | ICD-10-CM

## 2016-04-15 NOTE — Telephone Encounter (Signed)
Please release lab orders for [facility]. Patient will be going tomorrow for labs in [place].

## 2016-04-15 NOTE — Telephone Encounter (Signed)
Lab orders released and faxed.  

## 2016-04-16 NOTE — Telephone Encounter (Signed)
wnl

## 2016-04-17 LAB — CBC WITH DIFFERENTIAL/PLATELET
BASOS: 0 %
Basophils Absolute: 0 10*3/uL (ref 0.0–0.2)
EOS (ABSOLUTE): 0.7 10*3/uL — AB (ref 0.0–0.4)
EOS: 8 %
HEMATOCRIT: 46.1 % (ref 37.5–51.0)
Hemoglobin: 15.5 g/dL (ref 13.0–17.7)
IMMATURE GRANS (ABS): 0 10*3/uL (ref 0.0–0.1)
IMMATURE GRANULOCYTES: 0 %
LYMPHS: 15 %
Lymphocytes Absolute: 1.4 10*3/uL (ref 0.7–3.1)
MCH: 29.9 pg (ref 26.6–33.0)
MCHC: 33.6 g/dL (ref 31.5–35.7)
MCV: 89 fL (ref 79–97)
MONOS ABS: 0.6 10*3/uL (ref 0.1–0.9)
Monocytes: 7 %
NEUTROS PCT: 70 %
Neutrophils Absolute: 6.3 10*3/uL (ref 1.4–7.0)
PLATELETS: 217 10*3/uL (ref 150–379)
RBC: 5.18 x10E6/uL (ref 4.14–5.80)
RDW: 14.3 % (ref 12.3–15.4)
WBC: 9.1 10*3/uL (ref 3.4–10.8)

## 2016-04-17 LAB — CMP14+EGFR
ALK PHOS: 96 IU/L (ref 39–117)
ALT: 19 IU/L (ref 0–44)
AST: 16 IU/L (ref 0–40)
Albumin/Globulin Ratio: 1.9 (ref 1.2–2.2)
Albumin: 4.3 g/dL (ref 3.6–4.8)
BUN/Creatinine Ratio: 10 (ref 10–24)
BUN: 10 mg/dL (ref 8–27)
Bilirubin Total: 0.4 mg/dL (ref 0.0–1.2)
CO2: 25 mmol/L (ref 18–29)
CREATININE: 1.05 mg/dL (ref 0.76–1.27)
Calcium: 9.2 mg/dL (ref 8.6–10.2)
Chloride: 102 mmol/L (ref 96–106)
GFR calc Af Amer: 88 mL/min/{1.73_m2} (ref >59)
GFR, EST NON AFRICAN AMERICAN: 76 mL/min/{1.73_m2} (ref >59)
GLUCOSE: 114 mg/dL — AB (ref 65–99)
Globulin, Total: 2.3 g/dL (ref 1.5–4.5)
Potassium: 4.2 mmol/L (ref 3.5–5.2)
SODIUM: 144 mmol/L (ref 134–144)
Total Protein: 6.6 g/dL (ref 6.0–8.5)

## 2016-04-17 LAB — URIC ACID: URIC ACID: 5.5 mg/dL (ref 3.7–8.6)

## 2016-04-17 NOTE — Telephone Encounter (Signed)
WNL

## 2016-05-10 NOTE — Progress Notes (Signed)
Office Visit Note  Patient: Peter Burch             Date of Birth: 1955-04-16           MRN: [id number]             PCP: Sharilyn Sites, MD Referring: Sharilyn Sites, MD Visit Date: 05/15/2016 Occupation: @GUAROCC @    Subjective:  Follow-up on gout.   History of Present Illness: [name] is a 61 y.o. male with history of gout and osteoarthritis. He states she's been doing quite well. He has not had a gout flare in a long time. He has been taking allopurinol on daily basis and colchicine only on when necessary basis. He denies any discomfort in his hands or knee joints. He states he had 2 episodes of swelling in his right knee joint each was triggered with walking. He states she's not been walking for exercise currently and taking it easy. He is planning to go to [place] next week. He is concerned with the diet and also walking he may have a flare. He requests prednisone taper. He states he will take it only if needed.  Activities of Daily Living:  Patient reports morning stiffness for 0 minute.   Patient Denies nocturnal pain.  Difficulty dressing/grooming: Denies Difficulty climbing stairs: Denies Difficulty getting out of chair: Denies Difficulty using hands for taps, buttons, cutlery, and/or writing: Denies   Review of Systems  Constitutional: Negative for fatigue, night sweats and weakness ( ).  HENT: Negative for mouth sores, mouth dryness and nose dryness.   Eyes: Negative for redness and dryness.  Respiratory: Negative for shortness of breath and difficulty breathing.   Cardiovascular: Negative for chest pain, palpitations, hypertension, irregular heartbeat and swelling in legs/feet.  Gastrointestinal: Negative for constipation and diarrhea.  Endocrine: Negative for increased urination.  Musculoskeletal: Negative for arthralgias, joint pain, joint swelling, myalgias, muscle weakness, morning stiffness, muscle tenderness and myalgias.  Skin: Negative for color  change, rash, hair loss, nodules/bumps, skin tightness, ulcers and sensitivity to sunlight.  Allergic/Immunologic: Negative for susceptible to infections.  Neurological: Negative for dizziness, fainting, memory loss and night sweats.  Hematological: Negative for swollen glands.  Psychiatric/Behavioral: Negative for depressed mood and sleep disturbance. The patient is not nervous/anxious.     PMFS History:  Patient Active Problem List   Diagnosis Date Noted  . Chronic pain of right knee 02/07/2016  . History of coronary artery disease 02/05/2016  . Primary osteoarthritis of both hands 02/05/2016  . Dyslipidemia 02/05/2016  . Renal calcinosis 02/05/2016  . Idiopathic chronic gout of multiple sites without tophus 01/15/2016  . Mixed hyperlipidemia 09/01/2012  . Atherosclerosis of coronary artery of native heart without angina pectoris 09/01/2012    Past Medical History:  Diagnosis Date  . CAD (coronary artery disease)   . Gout   . Sinus bradycardia     Family History  Problem Relation Age of Onset  . Heart attack Mother   . Cancer Father     lung   Past Surgical History:  Procedure Laterality Date  . CARDIAC CATHETERIZATION  05/16/2006   40% LAD lesion  . COLONOSCOPY N/A 01/19/2015   Procedure: COLONOSCOPY;  Surgeon: Rogene Houston, MD;  Location: [facility];  Service: Endoscopy;  Laterality: N/A;  1030  . NM MYOCAR PERF WALL MOTION  07/31/2009   mod to severe defect due to infarct/scar w/mild perinfarct ischemia  . US ECHOCARDIOGRAPHY  07/31/2009   impaired LV relaxation  Social History   Social History Narrative  . No narrative on file     Objective: Vital Signs: BP 117/77 (BP Location: Left Arm, Patient Position: Sitting, Cuff Size: Normal)   Pulse 92   Resp 14   Ht 6\' 3"  (1.905 m)   Wt 238 lb (108 kg)   BMI 29.75 kg/m    Physical Exam  Constitutional: He is oriented to person, place, and time. He appears well-developed and well-nourished.  HENT:  Head:  Normocephalic and atraumatic.  Eyes: Conjunctivae and EOM are normal. Pupils are equal, round, and reactive to light.  Neck: Normal range of motion. Neck supple.  Cardiovascular: Normal rate, regular rhythm and normal heart sounds.   Pulmonary/Chest: Effort normal and breath sounds normal.  Abdominal: Soft. Bowel sounds are normal.  Neurological: He is alert and oriented to person, place, and time.  Skin: Skin is warm and dry. Capillary refill takes less than 2 seconds.  Psychiatric: He has a normal mood and affect. His behavior is normal.  Nursing note and vitals reviewed.    Musculoskeletal Exam: C-spine and thoracic lumbar spine good range of motion. Shoulder joints elbow joints wrist joints are good range of motion. He had DIP PIP thickening bilaterally. He also has thickening of his left third flexor tendon. He is not having a trigger finger at this point. Hip joints knee joints ankles MTPs PIPs with good range of motion with no synovitis.  CDAI Exam: No CDAI exam completed.    Investigation: No additional findings. Telephone on 04/15/2016  Component Date Value Ref Range Status  . WBC 04/15/2016 9.1  3.4 - 10.8 x10E3/uL Final  . RBC 04/15/2016 5.18  4.14 - 5.80 x10E6/uL Final  . Hemoglobin 04/15/2016 15.5  13.0 - 17.7 g/dL Final  . Hematocrit 04/15/2016 46.1  37.5 - 51.0 % Final  . MCV 04/15/2016 89  79 - 97 fL Final  . MCH 04/15/2016 29.9  26.6 - 33.0 pg Final  . MCHC 04/15/2016 33.6  31.5 - 35.7 g/dL Final  . RDW 04/15/2016 14.3  12.3 - 15.4 % Final  . Platelets 04/15/2016 217  150 - 379 x10E3/uL Final  . Neutrophils 04/15/2016 70  Not Estab. % Final  . Lymphs 04/15/2016 15  Not Estab. % Final  . Monocytes 04/15/2016 7  Not Estab. % Final  . Eos 04/15/2016 8  Not Estab. % Final  . Basos 04/15/2016 0  Not Estab. % Final  . Neutrophils Absolute 04/15/2016 6.3  1.4 - 7.0 x10E3/uL Final  . Lymphocytes Absolute 04/15/2016 1.4  0.7 - 3.1 x10E3/uL Final  . Monocytes Absolute  04/15/2016 0.6  0.1 - 0.9 x10E3/uL Final  . EOS (ABSOLUTE) 04/15/2016 0.7* 0.0 - 0.4 x10E3/uL Final  . Basophils Absolute 04/15/2016 0.0  0.0 - 0.2 x10E3/uL Final  . Immature Granulocytes 04/15/2016 0  Not Estab. % Final  . Immature Grans (Abs) 04/15/2016 0.0  0.0 - 0.1 x10E3/uL Final  . Uric Acid 04/15/2016 5.5  3.7 - 8.6 mg/dL Final  . Glucose 04/15/2016 114* 65 - 99 mg/dL Final  . BUN 04/15/2016 10  8 - 27 mg/dL Final  . Creatinine, Ser 04/15/2016 1.05  0.76 - 1.27 mg/dL Final  . GFR calc non Af Amer 04/15/2016 76  >59 mL/min/1.73 Final  . GFR calc Af Amer 04/15/2016 88  >59 mL/min/1.73 Final  . BUN/Creatinine Ratio 04/15/2016 10  10 - 24 Final  . Sodium 04/15/2016 144  134 - 144 mmol/L Final  . Potassium 04/15/2016  4.2  3.5 - 5.2 mmol/L Final  . Chloride 04/15/2016 102  96 - 106 mmol/L Final  . CO2 04/15/2016 25  18 - 29 mmol/L Final  . Calcium 04/15/2016 9.2  8.6 - 10.2 mg/dL Final  . Total Protein 04/15/2016 6.6  6.0 - 8.5 g/dL Final  . Albumin 04/15/2016 4.3  3.6 - 4.8 g/dL Final  . Globulin, Total 04/15/2016 2.3  1.5 - 4.5 g/dL Final  . Albumin/Globulin Ratio 04/15/2016 1.9  1.2 - 2.2 Final  . Bilirubin Total 04/15/2016 0.4  0.0 - 1.2 mg/dL Final  . Alkaline Phosphatase 04/15/2016 96  39 - 117 IU/L Final  . AST 04/15/2016 16  0 - 40 IU/L Final  . ALT 04/15/2016 19  0 - 44 IU/L Final    Imaging: No results found.  Speciality Comments: No specialty comments available.    Procedures:  No procedures performed Allergies: Statins   Assessment / Plan:     Visit Diagnoses: Idiopathic chronic gout of multiple sites without tophus - With hyperuricemia. On allopurinol 450 mg by mouth daily , colchicine 0.6 mg when necessary.His uric acid isn't desirable range. He has not had a gout flare in a long time. He will continue current dose of allopurinol.  Primary osteoarthritis of both hands: He has some stiffness. He also has thickening of his left third flexor tendon which is not  symptomatic currently.  Chronic pain of right knee: He has had 2 episodes increased joint pain and swelling. For which she required cortisone injection. He states the x-ray showed osteoarthritis in the past. Joint protection and muscle strengthening and any joint brace was discussed. Per his request have also given him a prednisone taper which can be used in case he is a flare probably is in [place]. We had detailed discussion regarding viscose supplement injections. At this point he would like to wait.  History of coronary artery disease  History of hyperlipidemia  History of renal stone  Medication monitoring encounter - Plan: CBC with Differential/Platelet, COMPLETE METABOLIC PANEL WITH GFR, Uric acid    Orders: Orders Placed This Encounter  Procedures  . CBC with Differential/Platelet  . COMPLETE METABOLIC PANEL WITH GFR  . Uric acid   Meds ordered this encounter  Medications  . predniSONE (DELTASONE) 5 MG tablet    Sig: 4tabletsx4 days, 3tabletsx 4 days, 2tablets x4 days, 1tabletx4days,1/2tabletx4days    Dispense:  42 tablet    Refill:  0    Face-to-face time spent with patient was 30 minutes. 50% of time was spent in counseling and coordination of care.  Follow-Up Instructions: Return in about 6 months (around 11/15/2016) for Osteoarthritis, Gout.   Bo Merino, MD  Note - This record has been created using Editor, commissioning.  Chart creation errors have been sought, but may not always  have been located. Such creation errors do not reflect on  the standard of medical care.

## 2016-05-15 ENCOUNTER — Encounter: Payer: Self-pay | Admitting: Rheumatology

## 2016-05-15 ENCOUNTER — Ambulatory Visit (INDEPENDENT_AMBULATORY_CARE_PROVIDER_SITE_OTHER): Payer: 59 | Admitting: Rheumatology

## 2016-05-15 VITALS — BP 117/77 | HR 92 | Resp 14 | Ht 75.0 in | Wt 238.0 lb

## 2016-05-15 DIAGNOSIS — Z87442 Personal history of urinary calculi: Secondary | ICD-10-CM

## 2016-05-15 DIAGNOSIS — G8929 Other chronic pain: Secondary | ICD-10-CM | POA: Diagnosis not present

## 2016-05-15 DIAGNOSIS — Z8679 Personal history of other diseases of the circulatory system: Secondary | ICD-10-CM | POA: Diagnosis not present

## 2016-05-15 DIAGNOSIS — M19041 Primary osteoarthritis, right hand: Secondary | ICD-10-CM

## 2016-05-15 DIAGNOSIS — Z5181 Encounter for therapeutic drug level monitoring: Secondary | ICD-10-CM

## 2016-05-15 DIAGNOSIS — Z8639 Personal history of other endocrine, nutritional and metabolic disease: Secondary | ICD-10-CM | POA: Diagnosis not present

## 2016-05-15 DIAGNOSIS — M25561 Pain in right knee: Secondary | ICD-10-CM | POA: Diagnosis not present

## 2016-05-15 DIAGNOSIS — M19042 Primary osteoarthritis, left hand: Secondary | ICD-10-CM

## 2016-05-15 DIAGNOSIS — M1A09X0 Idiopathic chronic gout, multiple sites, without tophus (tophi): Secondary | ICD-10-CM

## 2016-05-15 MED ORDER — PREDNISONE 5 MG PO TABS: ORAL_TABLET | ORAL | 0 refills | Status: DC

## 2016-05-15 NOTE — Patient Instructions (Signed)
Labs are due in October 2018

## 2016-05-27 DIAGNOSIS — E782 Mixed hyperlipidemia: Secondary | ICD-10-CM | POA: Diagnosis not present

## 2016-07-16 ENCOUNTER — Ambulatory Visit: Payer: 59 | Admitting: Rheumatology

## 2016-08-12 ENCOUNTER — Ambulatory Visit (HOSPITAL_COMMUNITY)
Admission: RE | Admit: 2016-08-12 | Discharge: 2016-08-12 | Disposition: A | Discharge status: Home / Self Care | Payer: 59 | Source: Ambulatory Visit | Attending: Registered Nurse | Admitting: Registered Nurse

## 2016-08-12 ENCOUNTER — Other Ambulatory Visit (HOSPITAL_COMMUNITY): Payer: Self-pay | Admitting: Registered Nurse

## 2016-08-12 DIAGNOSIS — M79605 Pain in left leg: Secondary | ICD-10-CM

## 2016-08-12 DIAGNOSIS — M7989 Other specified soft tissue disorders: Secondary | ICD-10-CM

## 2016-08-12 DIAGNOSIS — M1991 Primary osteoarthritis, unspecified site: Secondary | ICD-10-CM | POA: Diagnosis not present

## 2016-08-12 DIAGNOSIS — M79604 Pain in right leg: Secondary | ICD-10-CM | POA: Diagnosis present

## 2016-08-12 DIAGNOSIS — I8393 Asymptomatic varicose veins of bilateral lower extremities: Secondary | ICD-10-CM | POA: Diagnosis not present

## 2016-08-12 DIAGNOSIS — R600 Localized edema: Secondary | ICD-10-CM | POA: Diagnosis not present

## 2016-08-12 DIAGNOSIS — Z1389 Encounter for screening for other disorder: Secondary | ICD-10-CM | POA: Diagnosis not present

## 2016-08-12 DIAGNOSIS — E782 Mixed hyperlipidemia: Secondary | ICD-10-CM | POA: Diagnosis not present

## 2016-08-13 ENCOUNTER — Encounter: Payer: Self-pay | Admitting: Cardiovascular Disease

## 2016-08-13 ENCOUNTER — Ambulatory Visit (HOSPITAL_COMMUNITY)
Admission: RE | Admit: 2016-08-13 | Discharge: 2016-08-13 | Disposition: A | Discharge status: Home / Self Care | Payer: 59 | Source: Ambulatory Visit | Attending: Registered Nurse | Admitting: Registered Nurse

## 2016-08-13 DIAGNOSIS — M79604 Pain in right leg: Secondary | ICD-10-CM | POA: Diagnosis not present

## 2016-08-13 DIAGNOSIS — M7989 Other specified soft tissue disorders: Secondary | ICD-10-CM | POA: Diagnosis not present

## 2016-08-13 DIAGNOSIS — M79605 Pain in left leg: Secondary | ICD-10-CM | POA: Diagnosis not present

## 2016-08-13 DIAGNOSIS — M79661 Pain in right lower leg: Secondary | ICD-10-CM | POA: Diagnosis not present

## 2016-09-13 ENCOUNTER — Other Ambulatory Visit: Payer: Self-pay | Admitting: Rheumatology

## 2016-09-13 NOTE — Telephone Encounter (Signed)
Last Visit: 05/15/16 Next Visit: 11/19/16 Labs: 04/15/16 WNL  Okay to refill per Dr. Estanislado Pandy

## 2016-09-23 DIAGNOSIS — B078 Other viral warts: Secondary | ICD-10-CM | POA: Diagnosis not present

## 2016-09-23 DIAGNOSIS — D225 Melanocytic nevi of trunk: Secondary | ICD-10-CM | POA: Diagnosis not present

## 2016-11-07 NOTE — Progress Notes (Signed)
Office Visit Note  Patient: Peter Burch             Date of Birth: 12-19-1955           MRN: [id number]             PCP: Sharilyn Sites, MD Referring: Sharilyn Sites, MD Visit Date: 11/19/2016 Occupation: @GUAROCC @    Subjective:  Medication Management   History of Present Illness: Peter Burch is a 61 y.o. male with history of osteoarthritis and gout. He states he has not had any major gout flare. He has occasional twinges for which she takes colchicine on a when necessary basis. He's been having increased pain and discomfort in his right knee joint. He denies any joint swelling. He has some stiffness in his hands.He has been having some lower back discomfort without any radiculopathy.  Activities of Daily Living:  Patient reports morning stiffness for 1 minute.   Patient Denies nocturnal pain.  Difficulty dressing/grooming: Denies Difficulty climbing stairs: Denies Difficulty getting out of chair: Denies Difficulty using hands for taps, buttons, cutlery, and/or writing: Denies   Review of Systems  Constitutional: Negative for fatigue, night sweats and weakness ( ).  HENT: Negative for mouth sores, mouth dryness and nose dryness.   Eyes: Negative for redness and dryness.  Respiratory: Negative for shortness of breath and difficulty breathing.   Cardiovascular: Negative for chest pain, palpitations, hypertension, irregular heartbeat and swelling in legs/feet.  Gastrointestinal: Negative for constipation and diarrhea.  Endocrine: Negative for increased urination.  Musculoskeletal: Positive for arthralgias, joint pain and morning stiffness. Negative for joint swelling, myalgias, muscle weakness, muscle tenderness and myalgias.  Skin: Negative for color change, rash, hair loss, nodules/bumps, skin tightness, ulcers and sensitivity to sunlight.  Allergic/Immunologic: Negative for susceptible to infections.  Neurological: Negative for dizziness, fainting, memory loss and night  sweats.  Hematological: Negative for swollen glands.  Psychiatric/Behavioral: Negative for depressed mood and sleep disturbance. The patient is not nervous/anxious.     PMFS History:  Patient Active Problem List   Diagnosis Date Noted  . Chronic pain of right knee 02/07/2016  . History of coronary artery disease 02/05/2016  . Primary osteoarthritis of both hands 02/05/2016  . Dyslipidemia 02/05/2016  . Renal calcinosis 02/05/2016  . Idiopathic chronic gout of multiple sites without tophus 01/15/2016  . Mixed hyperlipidemia 09/01/2012  . Atherosclerosis of coronary artery of native heart without angina pectoris 09/01/2012    Past Medical History:  Diagnosis Date  . CAD (coronary artery disease)   . Gout   . Sinus bradycardia     Family History  Problem Relation Age of Onset  . Heart attack Mother   . Cancer Father        lung   Past Surgical History:  Procedure Laterality Date  . CARDIAC CATHETERIZATION  05/16/2006   40% LAD lesion  . NM MYOCAR PERF WALL MOTION  07/31/2009   mod to severe defect due to infarct/scar w/mild perinfarct ischemia  . US ECHOCARDIOGRAPHY  07/31/2009   impaired LV relaxation   Social History   Social History Narrative  . Not on file     Objective: Vital Signs: BP 125/76 (BP Location: Left Arm, Patient Position: Sitting, Cuff Size: Large)   Pulse 94   Resp 14   Ht 6\' 3"  (1.905 m)   Wt 238 lb (108 kg)   BMI 29.75 kg/m    Physical Exam  Constitutional: He is oriented to person, place, and time. He  appears well-developed and well-nourished.  HENT:  Head: Normocephalic and atraumatic.  Eyes: Conjunctivae and EOM are normal. Pupils are equal, round, and reactive to light.  Neck: Normal range of motion. Neck supple.  Cardiovascular: Normal rate, regular rhythm and normal heart sounds.  Pulmonary/Chest: Effort normal and breath sounds normal.  Abdominal: Soft. Bowel sounds are normal.  Neurological: He is alert and oriented to person, place,  and time.  Skin: Skin is warm and dry. Capillary refill takes less than 2 seconds.  Psychiatric: He has a normal mood and affect. His behavior is normal.  Nursing note and vitals reviewed.    Musculoskeletal Exam: c-spine and thoracic lumbar spine good range of motion. He has some discomfort over her coccyx area. Shoulder joints, elbow joints, wrist joints are good range of motion. He has DIP PIP thickening in his bilateral hands consistent with osteoarthritis. Hip joints with good range of motion. He has some crepitus and his knee joints. Although joints full range of motion with no synovitis.  CDAI Exam: No CDAI exam completed.    Investigation: No additional findings.Uric acid: 04/15/2016 5.5 CBC Latest Ref Rng & Units 11/15/2016 04/15/2016 12/08/2006  WBC 3.4 - 10.8 x10E3/uL 7.6 9.1 6.3  Hemoglobin 13.0 - 17.7 g/dL 15.5 15.5 14.5  Hematocrit 37.5 - 51.0 % 46.2 46.1 42.2  Platelets 150 - 379 x10E3/uL 193 217 158   CMP Latest Ref Rng & Units 11/15/2016 04/15/2016 08/21/2012  Glucose 65 - 99 mg/dL 110(H) 114(H) 80  BUN 8 - 27 mg/dL 19 10 15   Creatinine 0.76 - 1.27 mg/dL 1.16 1.05 1.29  Sodium 134 - 144 mmol/L 141 144 140  Potassium 3.5 - 5.2 mmol/L 4.7 4.2 4.1  Chloride 96 - 106 mmol/L 102 102 104  CO2 20 - 29 mmol/L 24 25 28   Calcium 8.6 - 10.2 mg/dL 9.4 9.2 9.7  Total Protein 6.0 - 8.5 g/dL 6.7 6.6 6.1  Total Bilirubin 0.0 - 1.2 mg/dL 0.4 0.4 1.1  Alkaline Phos 39 - 117 IU/L 97 96 76  AST 0 - 40 IU/L 23 16 21   ALT 0 - 44 IU/L 28 19 31     Imaging: No results found.  Speciality Comments: No specialty comments available.    Procedures:  No procedures performed Allergies: Statins   Assessment / Plan:     Visit Diagnoses: Idiopathic chronic gout of multiple sites without tophus - With hyperuricemia. Patient denies having any recent flares of gout. He takes colchicine when necessary if he feels any twinges. His last uric acid was in desirable range. On allopurinol 450 mg by mouth  daily , colchicine 0.6 mg when necessary.Uric acid: 04/15/2016 5.5 - Plan: Uric acid  Primary osteoarthritis of both hands: He does have some stiffness in his hands.  Primary osteoarthritis of both knees: He has history of moderate osteoarthritis in his knee joints. He's been having increased right knee joint.. No warmth or swelling was noted. Abdomen and some knee joint exercises.  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 He would like to wait at this point.  History of hyperlipidemia:he is on Zetia.  History of coronary artery disease  History of renal stone  Orders: Orders Placed This Encounter  Procedures  . Uric acid   No orders of the defined types were placed in this encounter.   Face-to-face time spent with patient was 25 minutes. Greater than 50% of time was spent in counseling and coordination of care.  Follow-Up Instructions: Return in about 6 months (around 05/19/2017) for  Gout, Osteoarthritis.   Bo Merino, MD  Note - This record has been created using Editor, commissioning.  Chart creation errors have been sought, but may not always  have been located. Such creation errors do not reflect on  the standard of medical care.

## 2016-11-11 ENCOUNTER — Encounter: Payer: Self-pay | Admitting: Cardiovascular Disease

## 2016-11-11 ENCOUNTER — Ambulatory Visit: Payer: 59 | Admitting: Cardiovascular Disease

## 2016-11-11 VITALS — BP 116/84 | HR 80 | Ht 75.0 in | Wt 240.0 lb

## 2016-11-11 DIAGNOSIS — Z79899 Other long term (current) drug therapy: Secondary | ICD-10-CM

## 2016-11-11 DIAGNOSIS — R350 Frequency of micturition: Secondary | ICD-10-CM

## 2016-11-11 DIAGNOSIS — R7303 Prediabetes: Secondary | ICD-10-CM | POA: Diagnosis not present

## 2016-11-11 DIAGNOSIS — I2510 Atherosclerotic heart disease of native coronary artery without angina pectoris: Secondary | ICD-10-CM | POA: Diagnosis not present

## 2016-11-11 DIAGNOSIS — E669 Obesity, unspecified: Secondary | ICD-10-CM

## 2016-11-11 DIAGNOSIS — E785 Hyperlipidemia, unspecified: Secondary | ICD-10-CM | POA: Diagnosis not present

## 2016-11-11 NOTE — Progress Notes (Signed)
Cardiology Office Note    Date:  11/11/2016   ID:  Peter Burch, DOB August 14, 1955, MRN [id number]  PCP:  Sharilyn Sites, MD  Cardiologist:   Sanda Klein, MD   Chief complaint: Yearly follow-up.   History of Present Illness:  Peter Burch is a 61 y.o. male with known mild coronary artery disease (40% proximal LAD calcified lesion by cath 2008), without symptoms of angina pectoris and with a low risk nuclear stress test in 2011. He is known to have an inferior wall defect on nuclear stress test that is a false positive abnormality (this actually led to the heart cath in 2008). He has hyperlipidemia but has been unable to tolerate numerous statins. He does take Zetia without side effects.   He has not had any new medical problems since his last appointment a year ago. He has been busy helping with [employer] relief doing a lot of heavy lifting and walking without any complaints of angina or dyspnea.  He does admit to a lot of indulgence and sweets over the summer.  He's borderline obese with a BMI of exactly 30.  Wearing 38 inch pants. A year ago his hemoglobin A1c was consistent with borderline diabetes mellitus at 6%.  LDL cholesterol (96) within desirable range, but he had mild hypertriglyceridemia (225).  Past Medical History:  Diagnosis Date  . CAD (coronary artery disease)   . Gout   . Sinus bradycardia     Past Surgical History:  Procedure Laterality Date  . CARDIAC CATHETERIZATION  05/16/2006   40% LAD lesion  . NM MYOCAR PERF WALL MOTION  07/31/2009   mod to severe defect due to infarct/scar w/mild perinfarct ischemia  . US ECHOCARDIOGRAPHY  07/31/2009   impaired LV relaxation    Current Medications: Outpatient Medications Prior to Visit  Medication Sig Dispense Refill  . allopurinol (ZYLOPRIM) 300 MG tablet Take 1.5 tablets (450 mg total) by mouth daily. 135 tablet 1  . ALPRAZolam (XANAX) 0.5 MG tablet Take 1 tablet by mouth daily as needed for anxiety.     Marland Kitchen  ezetimibe (ZETIA) 10 MG tablet TAKE ONE TABLET BY MOUTH ONCE DAILY. 30 tablet 7  . fluticasone (FLONASE) 50 MCG/ACT nasal spray Place 1 spray into the nose as needed for allergies.     Marland Kitchen allopurinol (ZYLOPRIM) 300 MG tablet TAKE 1 AND 1/2 TABLETS BY MOUTH ONCE DAILY. 45 tablet 0  . colchicine 0.6 MG tablet Take 0.6 mg by mouth daily as needed (gout).     Marland Kitchen diclofenac sodium (VOLTAREN) 1 % GEL Apply 2 g topically 4 (four) times daily. 3 Tube 1  . predniSONE (DELTASONE) 5 MG tablet 4tabletsx4 days, 3tabletsx 4 days, 2tablets x4 days, 1tabletx4days,1/2tabletx4days 42 tablet 0  . rOPINIRole (REQUIP) 0.25 MG tablet      No facility-administered medications prior to visit.      Allergies:   Statins   Social History   Socioeconomic History  . Marital status: Married    Spouse name: None  . Number of children: None  . Years of education: None  . Highest education level: None  Social Needs  . Financial resource strain: None  . Food insecurity - worry: None  . Food insecurity - inability: None  . Transportation needs - medical: None  . Transportation needs - non-medical: None  Occupational History  . None  Tobacco Use  . Smoking status: Never Smoker  . Smokeless tobacco: Never Used  Substance and Sexual Activity  . Alcohol  use: Yes    Comment: 1 MONTHLY  . Drug use: No  . Sexual activity: None  Other Topics Concern  . None  Social History Narrative  . None     Family History:  The patient's family history includes Cancer in his father; Heart attack in his mother.   ROS:   Please see the history of present illness.    ROS All other systems reviewed and are negative.   PHYSICAL EXAM:   VS:  BP 116/84   Pulse 80   Ht 6\' 3"  (1.905 m)   Wt 240 lb (108.9 kg)   BMI 30.00 kg/m     General: Alert, oriented x3, no distress, overweight, but muscular and looks relatively fit. Head: no evidence of trauma, PERRL, EOMI, no exophtalmos or lid lag, no myxedema, no xanthelasma; normal  ears, nose and oropharynx Neck: normal jugular venous pulsations and no hepatojugular reflux; brisk carotid pulses without delay and no carotid bruits Chest: clear to auscultation, no signs of consolidation by percussion or palpation, normal fremitus, symmetrical and full respiratory excursions Cardiovascular: normal position and quality of the apical impulse, regular rhythm, normal first and second heart sounds, no murmurs, rubs or gallops Abdomen: no tenderness or distention, no masses by palpation, no abnormal pulsatility or arterial bruits, normal bowel sounds, no hepatosplenomegaly Extremities: no clubbing, cyanosis or edema; 2+ radial, ulnar and brachial pulses bilaterally; 2+ right femoral, posterior tibial and dorsalis pedis pulses; 2+ left femoral, posterior tibial and dorsalis pedis pulses; no subclavian or femoral bruits Neurological: grossly nonfocal Psych: Normal mood and affect   Wt Readings from Last 3 Encounters:  11/11/16 240 lb (108.9 kg)  05/15/16 238 lb (108 kg)  02/07/16 233 lb (105.7 kg)      Studies/Labs Reviewed:   EKG:  EKG is ordered today.  The ekg ordered today demonstrates normal sinus rhythm with minor nonspecific T wave flattening in the inferior leads and lateral leads, QTC 419 ms  Recent Labs: 04/15/2016: ALT 19; BUN 10; Creatinine, Ser 1.05; Hemoglobin 15.5; Platelets 217; Potassium 4.2; Sodium 144   Lipid Panel    Component Value Date/Time   CHOL 174 03/15/2014 0924   TRIG 121 03/15/2014 0924   HDL 38 (L) 03/15/2014 0924   CHOLHDL 4.6 03/15/2014 0924   VLDL 24 03/15/2014 0924   LDLCALC 112 (H) 03/15/2014 0924   October 31, 2015 [facility] Hemoglobin A1c 6%, cholesterol 184, HDL 43, LDL 96, triglycerides 225   ASSESSMENT:    1. Atherosclerosis of coronary artery of native heart without angina pectoris, unspecified vessel or lesion type   2. Dyslipidemia   3. Prediabetes   4. Mild obesity   5. Urinary frequency   6. Medication  management      PLAN:  In order of problems listed above:  1. CAD: Asymptomatic, despite relatively heavy activity recently.  Continue with risk factor modification. 2. HLP: With current treatment with ezetimibe LDL is in target range, but still has hypertriglyceridemia. 3. PreDM: Reviewed the importance of limiting carbohydrate intake, especially sweets and high glycemic index starters, regular physical exercise, advised weight loss, try to get to a baseline of 34 inches. 4. Obese: Right at the borderline of mildly obese.   Medication Adjustments/Labs and Tests Ordered: Current medicines are reviewed at length with the patient today.  Concerns regarding medicines are outlined above.  Medication changes, Labs and Tests ordered today are listed in the Patient Instructions below. Patient Instructions  Dr Sallyanne Kuster recommends that you continue  on your current medications as directed. Please refer to the Current Medication list given to you today.  Your physician recommends that you return for lab work at your earliest [place].  Dr Sallyanne Kuster recommends that you schedule a follow-up appointment in 12 months. You will receive a reminder letter in the mail two months in advance. If you don't receive a letter, please call our office to schedule the follow-up appointment.  If you need a refill on your cardiac medications before your next appointment, please call your pharmacy.    Signed, Sanda Klein, MD  11/11/2016 9:55 AM    [facility] [address], [place], [place]  [address] Phone: [phone]; Fax: [phone]

## 2016-11-11 NOTE — Patient Instructions (Signed)
Dr Croitoru recommends that you continue on your current medications as directed. Please refer to the Current Medication list given to you today.  Your physician recommends that you return for lab work at your earliest convenience - FASTING.  Dr Croitoru recommends that you schedule a follow-up appointment in 12 months. You will receive a reminder letter in the mail two months in advance. If you don't receive a letter, please call our office to schedule the follow-up appointment.  If you need a refill on your cardiac medications before your next appointment, please call your pharmacy. 

## 2016-11-12 NOTE — Addendum Note (Signed)
Addended by: Diana Eves on: 11/12/2016 02:39 PM   Modules accepted: Orders

## 2016-11-15 DIAGNOSIS — Z23 Encounter for immunization: Secondary | ICD-10-CM | POA: Diagnosis not present

## 2016-11-15 DIAGNOSIS — I2510 Atherosclerotic heart disease of native coronary artery without angina pectoris: Secondary | ICD-10-CM | POA: Diagnosis not present

## 2016-11-15 DIAGNOSIS — Z79899 Other long term (current) drug therapy: Secondary | ICD-10-CM | POA: Diagnosis not present

## 2016-11-15 DIAGNOSIS — R7303 Prediabetes: Secondary | ICD-10-CM | POA: Diagnosis not present

## 2016-11-15 DIAGNOSIS — E785 Hyperlipidemia, unspecified: Secondary | ICD-10-CM | POA: Diagnosis not present

## 2016-11-15 DIAGNOSIS — R001 Bradycardia, unspecified: Secondary | ICD-10-CM | POA: Diagnosis not present

## 2016-11-15 DIAGNOSIS — R350 Frequency of micturition: Secondary | ICD-10-CM | POA: Diagnosis not present

## 2016-11-16 LAB — COMPREHENSIVE METABOLIC PANEL
ALK PHOS: 97 IU/L (ref 39–117)
ALT: 28 IU/L (ref 0–44)
AST: 23 IU/L (ref 0–40)
Albumin/Globulin Ratio: 2.2 (ref 1.2–2.2)
Albumin: 4.6 g/dL (ref 3.6–4.8)
BILIRUBIN TOTAL: 0.4 mg/dL (ref 0.0–1.2)
BUN / CREAT RATIO: 16 (ref 10–24)
BUN: 19 mg/dL (ref 8–27)
CHLORIDE: 102 mmol/L (ref 96–106)
CO2: 24 mmol/L (ref 20–29)
Calcium: 9.4 mg/dL (ref 8.6–10.2)
Creatinine, Ser: 1.16 mg/dL (ref 0.76–1.27)
GFR calc Af Amer: 77 mL/min/{1.73_m2} (ref >59)
GFR calc non Af Amer: 67 mL/min/{1.73_m2} (ref >59)
GLOBULIN, TOTAL: 2.1 g/dL (ref 1.5–4.5)
GLUCOSE: 110 mg/dL — AB (ref 65–99)
Potassium: 4.7 mmol/L (ref 3.5–5.2)
SODIUM: 141 mmol/L (ref 134–144)
Total Protein: 6.7 g/dL (ref 6.0–8.5)

## 2016-11-16 LAB — CBC
Hematocrit: 46.2 % (ref 37.5–51.0)
Hemoglobin: 15.5 g/dL (ref 13.0–17.7)
MCH: 30 pg (ref 26.6–33.0)
MCHC: 33.5 g/dL (ref 31.5–35.7)
MCV: 90 fL (ref 79–97)
PLATELETS: 193 10*3/uL (ref 150–379)
RBC: 5.16 x10E6/uL (ref 4.14–5.80)
RDW: 13.8 % (ref 12.3–15.4)
WBC: 7.6 10*3/uL (ref 3.4–10.8)

## 2016-11-16 LAB — LIPID PANEL
CHOLESTEROL TOTAL: 194 mg/dL (ref 100–199)
Chol/HDL Ratio: 4.9 ratio (ref 0.0–5.0)
HDL: 40 mg/dL (ref >39)
LDL CALC: 117 mg/dL — AB (ref 0–99)
Triglycerides: 187 mg/dL — ABNORMAL HIGH (ref 0–149)
VLDL Cholesterol Cal: 37 mg/dL (ref 5–40)

## 2016-11-16 LAB — HEMOGLOBIN A1C
ESTIMATED AVERAGE GLUCOSE: 123 mg/dL
HEMOGLOBIN A1C: 5.9 % — AB (ref 4.8–5.6)

## 2016-11-16 LAB — PSA: PROSTATE SPECIFIC AG, SERUM: 1.6 ng/mL (ref 0.0–4.0)

## 2016-11-19 ENCOUNTER — Encounter: Payer: Self-pay | Admitting: Rheumatology

## 2016-11-19 ENCOUNTER — Ambulatory Visit: Payer: 59 | Admitting: Rheumatology

## 2016-11-19 VITALS — BP 125/76 | HR 94 | Resp 14 | Ht 75.0 in | Wt 238.0 lb

## 2016-11-19 DIAGNOSIS — M170 Bilateral primary osteoarthritis of knee: Secondary | ICD-10-CM | POA: Diagnosis not present

## 2016-11-19 DIAGNOSIS — M19041 Primary osteoarthritis, right hand: Secondary | ICD-10-CM

## 2016-11-19 DIAGNOSIS — M19042 Primary osteoarthritis, left hand: Secondary | ICD-10-CM

## 2016-11-19 DIAGNOSIS — M533 Sacrococcygeal disorders, not elsewhere classified: Secondary | ICD-10-CM

## 2016-11-19 DIAGNOSIS — Z8639 Personal history of other endocrine, nutritional and metabolic disease: Secondary | ICD-10-CM | POA: Diagnosis not present

## 2016-11-19 DIAGNOSIS — Z8679 Personal history of other diseases of the circulatory system: Secondary | ICD-10-CM

## 2016-11-19 DIAGNOSIS — M1A09X0 Idiopathic chronic gout, multiple sites, without tophus (tophi): Secondary | ICD-10-CM

## 2016-11-19 DIAGNOSIS — E785 Hyperlipidemia, unspecified: Secondary | ICD-10-CM | POA: Diagnosis not present

## 2016-11-19 DIAGNOSIS — Z87442 Personal history of urinary calculi: Secondary | ICD-10-CM

## 2016-11-19 LAB — URIC ACID: Uric Acid, Serum: 5.1 mg/dL (ref 4.0–8.0)

## 2016-11-19 NOTE — Patient Instructions (Signed)

## 2016-11-20 NOTE — Progress Notes (Signed)
Uric acid is in desirable range. No change in therapy.

## 2016-11-26 ENCOUNTER — Telehealth: Payer: Self-pay | Admitting: Cardiovascular Disease

## 2016-11-26 NOTE — Telephone Encounter (Signed)
Mr.Zimny is returning your call . Thanks

## 2016-12-13 ENCOUNTER — Other Ambulatory Visit: Payer: Self-pay | Admitting: Rheumatology

## 2016-12-13 NOTE — Telephone Encounter (Signed)
Last Visit: 11/19/16 Next Visit: 05/19/17 Labs: 11/15/16 cbc/cmp wnl  Okay to refill per Dr. Estanislado Pandy

## 2016-12-18 NOTE — Telephone Encounter (Signed)
Notes recorded by Diana Eves, CMA on 11/26/2016 at 3:57 PM EST Called patient with results. Patient verbalized understanding and agreed with plan. Patient reports since his visit with Dr C, he has eliminated sugars and starchy foods from his diet and has lost 4 pounds. Encouraged him to keep up the good work.

## 2017-01-16 DIAGNOSIS — Z0001 Encounter for general adult medical examination with abnormal findings: Secondary | ICD-10-CM | POA: Diagnosis not present

## 2017-01-16 DIAGNOSIS — E782 Mixed hyperlipidemia: Secondary | ICD-10-CM | POA: Diagnosis not present

## 2017-01-16 DIAGNOSIS — Z23 Encounter for immunization: Secondary | ICD-10-CM | POA: Diagnosis not present

## 2017-01-16 DIAGNOSIS — I2510 Atherosclerotic heart disease of native coronary artery without angina pectoris: Secondary | ICD-10-CM | POA: Diagnosis not present

## 2017-01-23 ENCOUNTER — Other Ambulatory Visit: Payer: Self-pay | Admitting: Cardiovascular Disease

## 2017-01-23 NOTE — Telephone Encounter (Signed)
REFILL 

## 2017-05-27 DIAGNOSIS — H15112 Episcleritis periodica fugax, left eye: Secondary | ICD-10-CM | POA: Diagnosis not present

## 2017-06-03 DIAGNOSIS — H04123 Dry eye syndrome of bilateral lacrimal glands: Secondary | ICD-10-CM | POA: Diagnosis not present

## 2017-06-30 ENCOUNTER — Other Ambulatory Visit: Payer: Self-pay | Admitting: Rheumatology

## 2017-06-30 ENCOUNTER — Telehealth: Payer: Self-pay | Admitting: Rheumatology

## 2017-06-30 NOTE — Telephone Encounter (Signed)
Patient called stating he was returning your call.   

## 2017-06-30 NOTE — Progress Notes (Signed)
Office Visit Note  Patient: Peter Burch             Date of Birth: 1955/04/27           MRN: [id number]             PCP: Sharilyn Sites, MD Referring: Sharilyn Sites, MD Visit Date: 07/01/2017 Occupation: @GUAROCC @    Subjective:  Hand stiffness   History of Present Illness: Peter Burch is a 62 y.o. male with history of gout and osteoarthritis.  Patient is on allopurinol 300 mg by mouth daily.  Patient lowered his dose of allopurinol from 450 mg to 300 mg by his PCP.  He has been taking allopurinol 4-5 times per week.  He states he has been avoiding his trigger foods and has been changing his diet which has been helping.  He denies any recent gout flares.  He states that he does not have any joint pain or joint swelling at this time.  He occasionally has hand stiffness in the evening.  He takes colchicine as needed for gout flares but has not had to take any recently.  He would like refills of allopurinol and colchicine today.  Activities of Daily Living:  Patient reports morning stiffness for 5  minutes.   Patient Denies nocturnal pain.  Difficulty dressing/grooming: Denies Difficulty climbing stairs: Denies Difficulty getting out of chair: Reports Difficulty using hands for taps, buttons, cutlery, and/or writing: Denies   Review of Systems  Constitutional: Negative for fatigue, fever and night sweats.  HENT: Negative for ear pain, mouth sores, mouth dryness and nose dryness.   Eyes: Negative for pain, redness, visual disturbance and dryness.  Respiratory: Negative for cough, shortness of breath and difficulty breathing.   Cardiovascular: Negative for chest pain, palpitations, hypertension, irregular heartbeat and swelling in legs/feet.  Gastrointestinal: Negative for blood in stool, constipation and diarrhea.  Endocrine: Negative for increased urination.  Genitourinary: Negative for difficulty urinating and painful urination.  Musculoskeletal: Positive for morning  stiffness. Negative for arthralgias, joint pain, joint swelling, myalgias, muscle weakness, muscle tenderness and myalgias.  Skin: Negative for color change, rash, hair loss, nodules/bumps, skin tightness, ulcers and sensitivity to sunlight.  Allergic/Immunologic: Negative for susceptible to infections.  Neurological: Negative for dizziness, fainting, numbness, memory loss, night sweats and weakness.  Hematological: Negative for bruising/bleeding tendency and swollen glands.  Psychiatric/Behavioral: Negative for depressed mood and sleep disturbance. The patient is not nervous/anxious.     PMFS History:  Patient Active Problem List   Diagnosis Date Noted  . Chronic pain of right knee 02/07/2016  . History of coronary artery disease 02/05/2016  . Primary osteoarthritis of both hands 02/05/2016  . Dyslipidemia 02/05/2016  . Renal calcinosis 02/05/2016  . Idiopathic chronic gout of multiple sites without tophus 01/15/2016  . Mixed hyperlipidemia 09/01/2012  . Atherosclerosis of coronary artery of native heart without angina pectoris 09/01/2012    Past Medical History:  Diagnosis Date  . CAD (coronary artery disease)   . Gout   . Sinus bradycardia     Family History  Problem Relation Age of Onset  . Heart attack Mother   . Cancer Father        lung   Past Surgical History:  Procedure Laterality Date  . CARDIAC CATHETERIZATION  05/16/2006   40% LAD lesion  . COLONOSCOPY N/A 01/19/2015   Procedure: COLONOSCOPY;  Surgeon: Rogene Houston, MD;  Location: [facility];  Service: Endoscopy;  Laterality: N/A;  1030  .  NM MYOCAR PERF WALL MOTION  07/31/2009   mod to severe defect due to infarct/scar w/mild perinfarct ischemia  . US ECHOCARDIOGRAPHY  07/31/2009   impaired LV relaxation   Social History   Social History Narrative  . Not on file     Objective: Vital Signs: BP 119/81 (BP Location: Left Arm, Patient Position: Sitting, Cuff Size: Normal)   Pulse 79   Ht 6\' 3"  (1.905 m)    Wt 235 lb (106.6 kg)   BMI 29.37 kg/m    Physical Exam  Constitutional: He is oriented to person, place, and time. He appears well-developed and well-nourished.  HENT:  Head: Normocephalic and atraumatic.  Eyes: Pupils are equal, round, and reactive to light. Conjunctivae and EOM are normal.  Neck: Normal range of motion. Neck supple.  Cardiovascular: Normal rate, regular rhythm and normal heart sounds.  Pulmonary/Chest: Effort normal and breath sounds normal.  Abdominal: Soft. Bowel sounds are normal.  Lymphadenopathy:    He has no cervical adenopathy.  Neurological: He is alert and oriented to person, place, and time.  Skin: Skin is warm and dry. Capillary refill takes less than 2 seconds.  Psychiatric: He has a normal mood and affect. His behavior is normal.  Nursing note and vitals reviewed.    Musculoskeletal Exam: C-spine, thoracic spine, lumbar spine good range of motion.  No midline spinal tenderness.  No SI joint tenderness.  Shoulder joints, elbow joints, wrist joints, MCPs, PIPs, DIPs good range of motion with no synovitis.  He has PIP and DIP synovial thickening consistent with osteoarthritis of bilateral hands.  He has complete fist formation bilaterally.  Hip joints, knee joints, ankle joints, MTPs, PIPs, DIPs good range of motion with no synovitis.  No warmth or effusion of bilateral knee joints.  No tenderness of trochanteric bursa bilaterally.  CDAI Exam: No CDAI exam completed.    Investigation: No additional findings.Uric acid: 11/19/2016 5.1 CBC Latest Ref Rng & Units 11/15/2016 04/15/2016 12/08/2006  WBC 3.4 - 10.8 x10E3/uL 7.6 9.1 6.3  Hemoglobin 13.0 - 17.7 g/dL 15.5 15.5 14.5  Hematocrit 37.5 - 51.0 % 46.2 46.1 42.2  Platelets 150 - 379 x10E3/uL 193 217 158   CMP Latest Ref Rng & Units 11/15/2016 04/15/2016 08/21/2012  Glucose 65 - 99 mg/dL 110(H) 114(H) 80  BUN 8 - 27 mg/dL 19 10 15   Creatinine 0.76 - 1.27 mg/dL 1.16 1.05 1.29  Sodium 134 - 144 mmol/L 141 144  140  Potassium 3.5 - 5.2 mmol/L 4.7 4.2 4.1  Chloride 96 - 106 mmol/L 102 102 104  CO2 20 - 29 mmol/L 24 25 28   Calcium 8.6 - 10.2 mg/dL 9.4 9.2 9.7  Total Protein 6.0 - 8.5 g/dL 6.7 6.6 6.1  Total Bilirubin 0.0 - 1.2 mg/dL 0.4 0.4 1.1  Alkaline Phos 39 - 117 IU/L 97 96 76  AST 0 - 40 IU/L 23 16 21   ALT 0 - 44 IU/L 28 19 31     Imaging: No results found.  Speciality Comments: No specialty comments available.    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  He has no joint pain or joint swelling at this time.  No active inflammation was noted.  He has been avoiding trigger foods and has been changing his diet which has been helping prevent flares.  He was  previously on allopurinol 450 mg by mouth daily but his PCP advised him to lower his dose of allopurinol to 300 mg by mouth daily.  He has been taking allopurinol 4-5 times per week.  He is advised to take his allopurinol as prescribed.  A refill of allopurinol sent to the pharmacy yesterday.  We will refill his colchicine as well.  He takes colchicine 0.6 mg as needed if he is having a flare.  We will check uric acid level and CBC and CMP today.  Plan: CBC with Differential/Platelet, COMPLETE METABOLIC PANEL WITH GFR, Uric acid  Medication monitoring encounter -CBC and CMP are drawn today to monitor for drug toxicity.  Plan: CBC with Differential/Platelet, COMPLETE METABOLIC PANEL WITH GFR   Primary osteoarthritis of both hands: He has PIP and DIP synovial thickening consistent with osteoarthritis of bilateral hands.  No synovitis was noted.  He experiences hand stiffness in the evenings.  Joint protection and muscle strengthening were discussed.  Primary osteoarthritis of both knees: No warmth or effusion.  He has good range of motion of bilateral knee joints.  He has  no discomfort in his knees at this time.  Other medical conditions are listed as follows:  Coccygodynia  History of renal stone  History of hyperlipidemia - he is on Zetia.  History of coronary artery disease  Dyslipidemia   Orders: Orders Placed This Encounter  Procedures  . CBC with Differential/Platelet  . COMPLETE METABOLIC PANEL WITH GFR  . Uric acid   No orders of the defined types were placed in this encounter.     Follow-Up Instructions: Return in about 6 months (around 12/31/2017) for Gout, Osteoarthritis.   Ofilia Neas, PA-C   I examined and evaluated the patient with Peter Sams PA.  Patient had no swelling on my examination.  The plan of care was discussed as noted above.  Bo Merino, MD  Note - This record has been created using Editor, commissioning.  Chart creation errors have been sought, but may not always  have been located. Such creation errors do not reflect on  the standard of medical care.

## 2017-06-30 NOTE — Telephone Encounter (Addendum)
Last Visit: 11/19/17 Next visit due May 2019 Message sent to front to schedule patient Labs: 11/15/16 elevated glucose  Left message to advise patient he is due for labs.   Okay to refill 30 day supply per Dr. Estanislado Pandy

## 2017-07-01 ENCOUNTER — Ambulatory Visit: Payer: 59 | Admitting: Rheumatology

## 2017-07-01 ENCOUNTER — Encounter: Payer: Self-pay | Admitting: Rheumatology

## 2017-07-01 VITALS — BP 119/81 | HR 79 | Ht 75.0 in | Wt 235.0 lb

## 2017-07-01 DIAGNOSIS — Z5181 Encounter for therapeutic drug level monitoring: Secondary | ICD-10-CM

## 2017-07-01 DIAGNOSIS — M1A09X0 Idiopathic chronic gout, multiple sites, without tophus (tophi): Secondary | ICD-10-CM | POA: Diagnosis not present

## 2017-07-01 DIAGNOSIS — M533 Sacrococcygeal disorders, not elsewhere classified: Secondary | ICD-10-CM

## 2017-07-01 DIAGNOSIS — M170 Bilateral primary osteoarthritis of knee: Secondary | ICD-10-CM | POA: Diagnosis not present

## 2017-07-01 DIAGNOSIS — Z8679 Personal history of other diseases of the circulatory system: Secondary | ICD-10-CM | POA: Diagnosis not present

## 2017-07-01 DIAGNOSIS — M19041 Primary osteoarthritis, right hand: Secondary | ICD-10-CM

## 2017-07-01 DIAGNOSIS — Z87442 Personal history of urinary calculi: Secondary | ICD-10-CM

## 2017-07-01 DIAGNOSIS — E785 Hyperlipidemia, unspecified: Secondary | ICD-10-CM | POA: Diagnosis not present

## 2017-07-01 DIAGNOSIS — M19042 Primary osteoarthritis, left hand: Secondary | ICD-10-CM

## 2017-07-01 DIAGNOSIS — Z8639 Personal history of other endocrine, nutritional and metabolic disease: Secondary | ICD-10-CM | POA: Diagnosis not present

## 2017-07-01 MED ORDER — MITIGARE 0.6 MG PO CAPS: 0.6000 mg | ORAL_CAPSULE | Freq: Every day | ORAL | 1 refills | Status: DC

## 2017-07-01 NOTE — Telephone Encounter (Signed)
Patient has an appointment scheduled for today. Trying to reach patient to advise he needed labs. Pended labs for today's visit.

## 2017-07-02 LAB — CBC WITH DIFFERENTIAL/PLATELET
Basophils Absolute: 51 cells/uL (ref 0–200)
Basophils Relative: 0.5 %
Eosinophils Absolute: 571 cells/uL — ABNORMAL HIGH (ref 15–500)
Eosinophils Relative: 5.6 %
HCT: 44.2 % (ref 38.5–50.0)
Hemoglobin: 14.9 g/dL (ref 13.2–17.1)
Lymphs Abs: 1438 cells/uL (ref 850–3900)
MCH: 29.6 pg (ref 27.0–33.0)
MCHC: 33.7 g/dL (ref 32.0–36.0)
MCV: 87.9 fL (ref 80.0–100.0)
MONOS PCT: 8.4 %
MPV: 11.7 fL (ref 7.5–12.5)
NEUTROS PCT: 71.4 %
Neutro Abs: 7283 cells/uL (ref 1500–7800)
PLATELETS: 175 10*3/uL (ref 140–400)
RBC: 5.03 10*6/uL (ref 4.20–5.80)
RDW: 13.8 % (ref 11.0–15.0)
TOTAL LYMPHOCYTE: 14.1 %
WBC: 10.2 10*3/uL (ref 3.8–10.8)
WBCMIX: 857 {cells}/uL (ref 200–950)

## 2017-07-02 LAB — COMPLETE METABOLIC PANEL WITH GFR
AG RATIO: 2 (calc) (ref 1.0–2.5)
ALT: 17 U/L (ref 9–46)
AST: 15 U/L (ref 10–35)
Albumin: 4.5 g/dL (ref 3.6–5.1)
Alkaline phosphatase (APISO): 83 U/L (ref 40–115)
BUN: 16 mg/dL (ref 7–25)
CHLORIDE: 104 mmol/L (ref 98–110)
CO2: 29 mmol/L (ref 20–32)
Calcium: 9.7 mg/dL (ref 8.6–10.3)
Creat: 1.24 mg/dL (ref 0.70–1.25)
GFR, EST NON AFRICAN AMERICAN: 61 mL/min/{1.73_m2} (ref >=60)
GFR, Est African American: 71 mL/min/{1.73_m2} (ref >=60)
GLOBULIN: 2.2 g/dL (ref 1.9–3.7)
Glucose, Bld: 86 mg/dL (ref 65–99)
POTASSIUM: 4 mmol/L (ref 3.5–5.3)
SODIUM: 142 mmol/L (ref 135–146)
Total Bilirubin: 0.6 mg/dL (ref 0.2–1.2)
Total Protein: 6.7 g/dL (ref 6.1–8.1)

## 2017-07-02 LAB — URIC ACID: Uric Acid, Serum: 4.4 mg/dL (ref 4.0–8.0)

## 2017-07-02 NOTE — Progress Notes (Signed)
Labs are WNL.

## 2017-08-21 ENCOUNTER — Other Ambulatory Visit: Payer: Self-pay | Admitting: Rheumatology

## 2017-08-21 NOTE — Telephone Encounter (Signed)
Last visit: 07/01/2017 Next visit: 12/25/2017 Labs: 07/01/2017 WNL  Uric acid: 07/01/2017 4.4  Okay to refill per Dr. Deveshwar 

## 2017-10-30 DIAGNOSIS — B355 Tinea imbricata: Secondary | ICD-10-CM | POA: Diagnosis not present

## 2017-10-30 DIAGNOSIS — Z23 Encounter for immunization: Secondary | ICD-10-CM | POA: Diagnosis not present

## 2017-10-30 DIAGNOSIS — E663 Overweight: Secondary | ICD-10-CM | POA: Diagnosis not present

## 2017-10-30 DIAGNOSIS — Z6829 Body mass index (BMI) 29.0-29.9, adult: Secondary | ICD-10-CM | POA: Diagnosis not present

## 2017-11-12 ENCOUNTER — Ambulatory Visit: Payer: 59 | Admitting: Cardiovascular Disease

## 2017-11-12 ENCOUNTER — Encounter: Payer: Self-pay | Admitting: Cardiovascular Disease

## 2017-11-12 DIAGNOSIS — Z79899 Other long term (current) drug therapy: Secondary | ICD-10-CM | POA: Diagnosis not present

## 2017-11-12 DIAGNOSIS — R5383 Other fatigue: Secondary | ICD-10-CM

## 2017-11-12 DIAGNOSIS — E782 Mixed hyperlipidemia: Secondary | ICD-10-CM

## 2017-11-12 DIAGNOSIS — Z125 Encounter for screening for malignant neoplasm of prostate: Secondary | ICD-10-CM

## 2017-11-12 DIAGNOSIS — I2510 Atherosclerotic heart disease of native coronary artery without angina pectoris: Secondary | ICD-10-CM | POA: Diagnosis not present

## 2017-11-12 DIAGNOSIS — R7303 Prediabetes: Secondary | ICD-10-CM

## 2017-11-12 DIAGNOSIS — M1A09X0 Idiopathic chronic gout, multiple sites, without tophus (tophi): Secondary | ICD-10-CM | POA: Diagnosis not present

## 2017-11-12 DIAGNOSIS — R350 Frequency of micturition: Secondary | ICD-10-CM

## 2017-11-12 NOTE — Patient Instructions (Signed)
Medication Instructions:  Dr Croitoru recommends that you continue on your current medications as directed. Please refer to the Current Medication list given to you today.  If you need a refill on your cardiac medications before your next appointment, please call your pharmacy.   Lab work: Your physician recommends that you return for lab work at your convenience - FASTING.  If you have labs (blood work) drawn today and your tests are completely normal, you will receive your results only by: . MyChart Message (if you have MyChart) OR . A paper copy in the mail If you have any lab test that is abnormal or we need to change your treatment, we will call you to review the results.  Follow-Up: At [facility], you and your health needs are our priority.  As part of our continuing mission to provide you with exceptional heart care, we have created designated Provider Care Teams.  These Care Teams include your primary Cardiologist (physician) and Advanced Practice Providers (APPs -  Physician Assistants and Nurse Practitioners) who all work together to provide you with the care you need, when you need it. You will need a follow up appointment in 12 months.  Please call our office 2 months in advance to schedule this appointment.  You may see Mihai Croitoru, MD or one of the following Advanced Practice Providers on your designated Care Team: Hao Meng, PA-C . Angela Duke, PA-C 

## 2017-11-12 NOTE — Progress Notes (Signed)
Cardiology Office Note    Date:  11/12/2017   ID:  Peter Burch, DOB 01/15/1955, MRN [id number]  PCP:  Sharilyn Sites, MD  Cardiologist:   Sanda Klein, MD   Chief complaint: Yearly follow-up.   History of Present Illness:  Peter Burch is a 62 y.o. male with known mild coronary artery disease (40% proximal LAD calcified lesion by cath 2008), without symptoms of angina pectoris and with a low risk nuclear stress test in 2011. He is known to have an inferior wall defect on nuclear stress test that is a false positive abnormality (this actually led to the heart cath in 2008). He has hyperlipidemia but has been unable to tolerate numerous statins. He does take Zetia without side effects.   He has been a lot more attention to his health this year.  He rarely will have a soft drink sweet tea.  He has drastically curtailed his intake of sweets.  He is lost about 8 pounds.  Cannot walk every day because this causes knee discomfort.  Tries to get some exercise.  He has not had any gout attacks.  The patient specifically denies any chest pain at rest exertion, dyspnea at rest or with exertion, orthopnea, paroxysmal nocturnal dyspnea, syncope, palpitations, focal neurological deficits, intermittent claudication, lower extremity edema, unexplained weight gain, cough, hemoptysis or wheezing. Does not have the stamina he used to have. Some occasional problems with urinary frequency.   Past Medical History:  Diagnosis Date  . CAD (coronary artery disease)   . Gout   . Sinus bradycardia     Past Surgical History:  Procedure Laterality Date  . CARDIAC CATHETERIZATION  05/16/2006   40% LAD lesion  . COLONOSCOPY N/A 01/19/2015   Procedure: COLONOSCOPY;  Surgeon: Rogene Houston, MD;  [facility];  Service: Endoscopy;  Laterality: N/A;  1030  . NM MYOCAR PERF WALL MOTION  07/31/2009   mod to severe defect due to infarct/scar w/mild perinfarct ischemia  . US ECHOCARDIOGRAPHY   07/31/2009   impaired LV relaxation    Current Medications: Outpatient Medications Prior to Visit  Medication Sig Dispense Refill  . allopurinol (ZYLOPRIM) 300 MG tablet TAKE 1 AND 1/2 TABLETS BY MOUTH ONCE DAILY. 45 tablet 0  . ALPRAZolam (XANAX) 0.5 MG tablet Take 1 tablet by mouth daily as needed for anxiety.     Marland Kitchen ezetimibe (ZETIA) 10 MG tablet TAKE ONE TABLET BY MOUTH ONCE DAILY. 30 tablet 11  . fluticasone (FLONASE) 50 MCG/ACT nasal spray Place 1 spray into the nose as needed for allergies.     Marland Kitchen MITIGARE 0.6 MG CAPS Take 0.6 mg by mouth daily. 30 capsule 1  . allopurinol (ZYLOPRIM) 300 MG tablet TAKE 1 AND 1/2 TABLETS BY MOUTH ONCE DAILY. 45 tablet 0  . allopurinol (ZYLOPRIM) 300 MG tablet TAKE 1 AND 1/2 TABLETS BY MOUTH ONCE DAILY. 45 tablet 0   No facility-administered medications prior to visit.      Allergies:   Statins   Social History   Socioeconomic History  . Marital status: Married    Spouse name: Not on file  . Number of children: Not on file  . Years of education: Not on file  . Highest education level: Not on file  Occupational History  . Not on file  Social Needs  . Financial resource strain: Not on file  . Food insecurity:    Worry: Not on file    Inability: Not on file  . Transportation  needs:    Medical: Not on file    Non-medical: Not on file  Tobacco Use  . Smoking status: Never Smoker  . Smokeless tobacco: Never Used  Substance and Sexual Activity  . Alcohol use: Yes    Comment: 1 MONTHLY  . Drug use: No  . Sexual activity: Not on file  Lifestyle  . Physical activity:    Days per week: Not on file    Minutes per session: Not on file  . Stress: Not on file  Relationships  . Social connections:    Talks on phone: Not on file    Gets together: Not on file    Attends religious service: Not on file    Active member of club or organization: Not on file    Attends meetings of clubs or organizations: Not on file    Relationship status: Not on  file  Other Topics Concern  . Not on file  Social History Narrative  . Not on file     Family History:  The patient's family history includes Cancer in his father; Heart attack in his mother.   ROS:   Please see the history of present illness.    ROS  All other systems reviewed and are negative.   PHYSICAL EXAM:   VS:  BP 104/70   Pulse (!) 56   Ht 6\' 3"  (1.905 m)   Wt 230 lb 6.4 oz (104.5 kg)   BMI 28.80 kg/m     General: Alert, oriented x3, no distress, overweight, but looks fit Head: no evidence of trauma, PERRL, EOMI, no exophtalmos or lid lag, no myxedema, no xanthelasma; normal ears, nose and oropharynx Neck: normal jugular venous pulsations and no hepatojugular reflux; brisk carotid pulses without delay and no carotid bruits Chest: clear to auscultation, no signs of consolidation by percussion or palpation, normal fremitus, symmetrical and full respiratory excursions Cardiovascular: normal position and quality of the apical impulse, regular rhythm, normal first and second heart sounds, no murmurs, rubs or gallops Abdomen: no tenderness or distention, no masses by palpation, no abnormal pulsatility or arterial bruits, normal bowel sounds, no hepatosplenomegaly Extremities: no clubbing, cyanosis or edema; 2+ radial, ulnar and brachial pulses bilaterally; 2+ right femoral, posterior tibial and dorsalis pedis pulses; 2+ left femoral, posterior tibial and dorsalis pedis pulses; no subclavian or femoral bruits Neurological: grossly nonfocal Psych: Normal mood and affect   Wt Readings from Last 3 Encounters:  11/12/17 230 lb 6.4 oz (104.5 kg)  07/01/17 235 lb (106.6 kg)  11/19/16 238 lb (108 kg)      Studies/Labs Reviewed:   EKG:  EKG is ordered today.  It shows sinus bradycardia, otherwise normal tracing, QTC 408 months  Recent Labs: 07/01/2017: ALT 17; BUN 16; Creat 1.24; Hemoglobin 14.9; Platelets 175; Potassium 4.0; Sodium 142   Lipid Panel    Component Value  Date/Time   CHOL 194 11/15/2016 0805   TRIG 187 (H) 11/15/2016 0805   HDL 40 11/15/2016 0805   CHOLHDL 4.9 11/15/2016 0805   CHOLHDL 4.6 03/15/2014 0924   VLDL 24 03/15/2014 0924   LDLCALC 117 (H) 11/15/2016 0805   October 31, 2015 [facility] Hemoglobin A1c 6%, cholesterol 184, HDL 43, LDL 96, triglycerides 225   ASSESSMENT:    1. Atherosclerosis of coronary artery of native heart without angina pectoris, unspecified vessel or lesion type   2. Mixed hyperlipidemia   3. Idiopathic chronic gout of multiple sites without tophus   4. Prediabetes   5.  Urinary frequency   6. Other fatigue   7. Screening PSA (prostate specific antigen)   8. Medication management      PLAN:  In order of problems listed above:  1. CAD: Asymptomatic, no severe stenoses on angiography although he did have moderate disease.  Continue with risk factor modification.  Note history of false positive nuclear stress test. 2. HLP: His lipid profile is typical metabolic syndrome/insulin resistance, but I suspect we will see improvement with his weight loss. 3. PreDM: Follow-up with hemoglobin A1c. 4. Overweight: Congratulated on weight loss and improved diet. 5. Gout: Follow-up on uric acid   Medication Adjustments/Labs and Tests Ordered: Current medicines are reviewed at length with the patient today.  Concerns regarding medicines are outlined above.  Medication changes, Labs and Tests ordered today are listed in the Patient Instructions below. Patient Instructions  Medication Instructions:  Dr Sallyanne Kuster recommends that you continue on your current medications as directed. Please refer to the Current Medication list given to you today.  If you need a refill on your cardiac medications before your next appointment, please call your pharmacy.   Lab work: Your physician recommends that you return for lab work at your convenience - FASTING.  If you have labs (blood work) drawn today and your tests are  completely normal, you will receive your results only by: Marland Kitchen MyChart Message (if you have MyChart) OR . A paper copy in the mail If you have any lab test that is abnormal or we need to change your treatment, we will call you to review the results.  Follow-Up: At [facility], you and your health needs are our priority.  As part of our continuing mission to provide you with exceptional heart care, we have created designated Provider Care Teams.  These Care Teams include your primary Cardiologist (physician) and Advanced Practice Providers (APPs -  Physician Assistants and Nurse Practitioners) who all work together to provide you with the care you need, when you need it. You will need a follow up appointment in 12 months.  Please call our office 2 months in advance to schedule this appointment.  You may see Sanda Klein, MD or one of the following Advanced Practice Providers on your designated Care Team: [place], [place] . Fabian Sharp, PA-C    Signed, Sanda Klein, MD  11/12/2017 9:17 AM    [facility] [address], [place], [place]  [address] Phone: [phone]; Fax: [phone]

## 2017-11-13 ENCOUNTER — Ambulatory Visit: Payer: 59 | Admitting: Cardiovascular Disease

## 2017-11-13 LAB — CBC
HEMATOCRIT: 44.9 % (ref 37.5–51.0)
Hemoglobin: 15.1 g/dL (ref 13.0–17.7)
MCH: 29.8 pg (ref 26.6–33.0)
MCHC: 33.6 g/dL (ref 31.5–35.7)
MCV: 89 fL (ref 79–97)
Platelets: 174 10*3/uL (ref 150–450)
RBC: 5.07 x10E6/uL (ref 4.14–5.80)
RDW: 13 % (ref 12.3–15.4)
WBC: 6.9 10*3/uL (ref 3.4–10.8)

## 2017-11-13 LAB — LIPID PANEL
CHOL/HDL RATIO: 4.6 ratio (ref 0.0–5.0)
Cholesterol, Total: 211 mg/dL — ABNORMAL HIGH (ref 100–199)
HDL: 46 mg/dL (ref >39)
LDL CALC: 142 mg/dL — AB (ref 0–99)
TRIGLYCERIDES: 117 mg/dL (ref 0–149)
VLDL Cholesterol Cal: 23 mg/dL (ref 5–40)

## 2017-11-13 LAB — PSA: Prostate Specific Ag, Serum: 1.9 ng/mL (ref 0.0–4.0)

## 2017-11-13 LAB — COMPREHENSIVE METABOLIC PANEL
ALK PHOS: 79 IU/L (ref 39–117)
ALT: 14 IU/L (ref 0–44)
AST: 15 IU/L (ref 0–40)
Albumin/Globulin Ratio: 2.7 — ABNORMAL HIGH (ref 1.2–2.2)
Albumin: 4.6 g/dL (ref 3.6–4.8)
BUN/Creatinine Ratio: 16 (ref 10–24)
BUN: 22 mg/dL (ref 8–27)
Bilirubin Total: 0.7 mg/dL (ref 0.0–1.2)
CALCIUM: 9.9 mg/dL (ref 8.6–10.2)
CO2: 24 mmol/L (ref 20–29)
CREATININE: 1.37 mg/dL — AB (ref 0.76–1.27)
Chloride: 103 mmol/L (ref 96–106)
GFR, EST AFRICAN AMERICAN: 63 mL/min/{1.73_m2} (ref >59)
GFR, EST NON AFRICAN AMERICAN: 54 mL/min/{1.73_m2} — AB (ref >59)
GLOBULIN, TOTAL: 1.7 g/dL (ref 1.5–4.5)
GLUCOSE: 95 mg/dL (ref 65–99)
POTASSIUM: 4.6 mmol/L (ref 3.5–5.2)
Sodium: 141 mmol/L (ref 134–144)
TOTAL PROTEIN: 6.3 g/dL (ref 6.0–8.5)

## 2017-11-13 LAB — HEMOGLOBIN A1C
Est. average glucose Bld gHb Est-mCnc: 114 mg/dL
Hgb A1c MFr Bld: 5.6 % (ref 4.8–5.6)

## 2017-11-13 LAB — URIC ACID: Uric Acid: 8.8 mg/dL — ABNORMAL HIGH (ref 3.7–8.6)

## 2017-12-04 ENCOUNTER — Other Ambulatory Visit: Payer: Self-pay | Admitting: Rheumatology

## 2017-12-08 NOTE — Telephone Encounter (Signed)
Last visit: 07/01/2017 Next visit: 12/25/2017 Labs: 07/01/2017 WNL  Uric acid: 07/01/2017 4.4  Okay to refill per Dr. Estanislado Pandy

## 2017-12-12 NOTE — Progress Notes (Deleted)
Office Visit Note  Patient: Peter Burch             Date of Birth: 07-08-1955           MRN: [id number]             PCP: Sharilyn Sites, MD Referring: Sharilyn Sites, MD Visit Date: 12/25/2017 Occupation: @GUAROCC @  Subjective:  No chief complaint on file.   History of Present Illness: Peter Burch is a 62 y.o. male ***   Activities of Daily Living:  Patient reports morning stiffness for *** {minute/hour:19697}.   Patient {ACTIONS;DENIES/REPORTS:21021675::"Denies"} nocturnal pain.  Difficulty dressing/grooming: {ACTIONS;DENIES/REPORTS:21021675::"Denies"} Difficulty climbing stairs: {ACTIONS;DENIES/REPORTS:21021675::"Denies"} Difficulty getting out of chair: {ACTIONS;DENIES/REPORTS:21021675::"Denies"} Difficulty using hands for taps, buttons, cutlery, and/or writing: {ACTIONS;DENIES/REPORTS:21021675::"Denies"}  No Rheumatology ROS completed.   PMFS History:  Patient Active Problem List   Diagnosis Date Noted  . Chronic pain of right knee 02/07/2016  . History of coronary artery disease 02/05/2016  . Primary osteoarthritis of both hands 02/05/2016  . Dyslipidemia 02/05/2016  . Renal calcinosis 02/05/2016  . Idiopathic chronic gout of multiple sites without tophus 01/15/2016  . Mixed hyperlipidemia 09/01/2012  . Atherosclerosis of coronary artery of native heart without angina pectoris 09/01/2012    Past Medical History:  Diagnosis Date  . CAD (coronary artery disease)   . Gout   . Sinus bradycardia     Family History  Problem Relation Age of Onset  . Heart attack Mother   . Cancer Father        lung   Past Surgical History:  Procedure Laterality Date  . CARDIAC CATHETERIZATION  05/16/2006   40% LAD lesion  . COLONOSCOPY N/A 01/19/2015   Procedure: COLONOSCOPY;  Surgeon: Rogene Houston, MD;  [facility];  Service: Endoscopy;  Laterality: N/A;  1030  . NM MYOCAR PERF WALL MOTION  07/31/2009   mod to severe defect due to infarct/scar w/mild  perinfarct ischemia  . US ECHOCARDIOGRAPHY  07/31/2009   impaired LV relaxation   Social History   Social History Narrative  . Not on file    Objective: Vital Signs: There were no vitals taken for this visit.   Physical Exam   Musculoskeletal Exam: ***  CDAI Exam: CDAI Score: Not documented Patient Global Assessment: Not documented; Provider Global Assessment: Not documented Swollen: Not documented; Tender: Not documented Joint Exam   Not documented   There is currently no information documented on the homunculus. Go to the Rheumatology activity and complete the homunculus joint exam.  Investigation: No additional findings.  Imaging: No results found.  Recent Labs: Lab Results  Component Value Date   WBC 6.9 11/12/2017   HGB 15.1 11/12/2017   PLT 174 11/12/2017   NA 141 11/12/2017   K 4.6 11/12/2017   CL 103 11/12/2017   CO2 24 11/12/2017   GLUCOSE 95 11/12/2017   BUN 22 11/12/2017   CREATININE 1.37 (H) 11/12/2017   BILITOT 0.7 11/12/2017   ALKPHOS 79 11/12/2017   AST 15 11/12/2017   ALT 14 11/12/2017   PROT 6.3 11/12/2017   ALBUMIN 4.6 11/12/2017   CALCIUM 9.9 11/12/2017   GFRAA 63 11/12/2017    Speciality Comments: No specialty comments available.  Procedures:  No procedures performed Allergies: Statins   Assessment / Plan:     Visit Diagnoses: Idiopathic chronic gout of multiple sites without tophus  Medication monitoring encounter - Allopurinol 450 mg po daily  Primary osteoarthritis of both hands  Primary osteoarthritis of both  knees  Coccygodynia  History of renal stone  History of hyperlipidemia  History of coronary artery disease   Orders: No orders of the defined types were placed in this encounter.  No orders of the defined types were placed in this encounter.   Face-to-face time spent with patient was *** minutes. Greater than 50% of time was spent in counseling and coordination of care.  Follow-Up Instructions: No  follow-ups on file.   Ofilia Neas, PA-C  Note - This record has been created using Dragon software.  Chart creation errors have been sought, but may not always  have been located. Such creation errors do not reflect on  the standard of medical care.

## 2017-12-21 ENCOUNTER — Encounter: Payer: Self-pay | Admitting: Rheumatology

## 2017-12-22 NOTE — Progress Notes (Signed)
Office Visit Note  Patient: Peter Burch             Date of Birth: [birth year]           MRN: [id number]             PCP: Sharilyn Sites, MD Referring: Sharilyn Sites, MD Visit Date: 12/23/2017 Occupation: @GUAROCC @  Subjective:  Medication monitoring   History of Present Illness: Peter Burch is a 62 y.o. male with history of gout and osteoarthritis.  He is taking allopurinol 300 mg po daily and colchicine 0.6 mg po PRN.  He denies any recent gout flares.  He recently had lab work with his cardiologist and his uric acid was 8.8 on 11/12/17.  He reports prior to lab work he was not as compliant taking his allopurinol and would miss about 3 doses weekly.  He was also eating a protein rich diet, which consisted of a lot of meat.  He has returned to his typical diet and has started taking allopurinol on a regular basis.  He has not need to take colchicine in over 6 months.  He denies any joint pain or joint swelling.   Activities of Daily Living:  Patient reports morning stiffness for 2  minutes.   Patient Denies nocturnal pain.  Difficulty dressing/grooming: Denies Difficulty climbing stairs: Denies Difficulty getting out of chair: Reports Difficulty using hands for taps, buttons, cutlery, and/or writing: Denies  Review of Systems  Constitutional: Positive for fatigue. Negative for night sweats.  HENT: Negative for mouth sores, mouth dryness and nose dryness.   Eyes: Negative for photophobia, pain, redness, visual disturbance and dryness.  Respiratory: Negative for cough, hemoptysis, shortness of breath and difficulty breathing.   Cardiovascular: Negative for chest pain, palpitations, hypertension, irregular heartbeat and swelling in legs/feet.  Gastrointestinal: Negative for blood in stool, constipation and diarrhea.  Endocrine: Negative for increased urination.  Genitourinary: Negative for painful urination.  Musculoskeletal: Positive for morning stiffness. Negative for  arthralgias, joint pain, joint swelling, myalgias, muscle weakness, muscle tenderness and myalgias.  Skin: Negative for color change, rash, hair loss, nodules/bumps, skin tightness, ulcers and sensitivity to sunlight.  Allergic/Immunologic: Negative for susceptible to infections.  Neurological: Negative for dizziness, fainting, memory loss, night sweats and weakness.  Hematological: Negative for swollen glands.  Psychiatric/Behavioral: Negative for depressed mood and sleep disturbance. The patient is not nervous/anxious.     PMFS History:  Patient Active Problem List   Diagnosis Date Noted  . Chronic pain of right knee 02/07/2016  . History of coronary artery disease 02/05/2016  . Primary osteoarthritis of both hands 02/05/2016  . Dyslipidemia 02/05/2016  . Renal calcinosis 02/05/2016  . Idiopathic chronic gout of multiple sites without tophus 01/15/2016  . Mixed hyperlipidemia 09/01/2012  . Atherosclerosis of coronary artery of native heart without angina pectoris 09/01/2012    Past Medical History:  Diagnosis Date  . CAD (coronary artery disease)   . Gout   . Sinus bradycardia     Family History  Problem Relation Age of Onset  . Heart attack Mother   . Cancer Father        lung   Past Surgical History:  Procedure Laterality Date  . CARDIAC CATHETERIZATION  05/16/2006   40% LAD lesion  . COLONOSCOPY N/A 01/19/2015   Procedure: COLONOSCOPY;  Surgeon: Rogene Houston, MD;  [facility];  Service: Endoscopy;  Laterality: N/A;  1030  . NM MYOCAR PERF WALL MOTION  07/31/2009  mod to severe defect due to infarct/scar w/mild perinfarct ischemia  . US ECHOCARDIOGRAPHY  07/31/2009   impaired LV relaxation   Social History   Social History Narrative  . Not on file    Objective: Vital Signs: BP 123/83 (BP Location: Right Arm, Patient Position: Sitting, Cuff Size: Normal)   Pulse 75   Resp 14   Ht 6\' 3"  (1.905 m)   Wt 235 lb 6.4 oz (106.8 kg)   BMI 29.42 kg/m     Physical Exam Vitals signs and nursing note reviewed.  Constitutional:      Appearance: He is well-developed.  HENT:     Head: Normocephalic and atraumatic.  Eyes:     Conjunctiva/sclera: Conjunctivae normal.     Pupils: Pupils are equal, round, and reactive to light.  Neck:     Musculoskeletal: Normal range of motion and neck supple.  Cardiovascular:     Rate and Rhythm: Normal rate and regular rhythm.     Heart sounds: Normal heart sounds.  Pulmonary:     Effort: Pulmonary effort is normal.     Breath sounds: Normal breath sounds.  Abdominal:     General: Bowel sounds are normal.     Palpations: Abdomen is soft.  Lymphadenopathy:     Cervical: No cervical adenopathy.  Skin:    General: Skin is warm and dry.     Capillary Refill: Capillary refill takes less than 2 seconds.     Comments: Nail pitting in fingernails.   Neurological:     Mental Status: He is alert and oriented to person, place, and time.  Psychiatric:        Behavior: Behavior normal.      Musculoskeletal Exam: C-spine, thoracic spine, and lumbar spine good ROM.  No midline spinal tenderness.  No SI joint tenderness. Shoulder joints, elbow joints, wrist joints, MCPs, PIPs, and DIPs good ROM with no synovitis.  DIP synovial thickening.  Bilateral 3rd digit dupuytren's contracture noted. Hip joints, knee joints, ankle joints, MTPs, PIPs, and DIPs good ROM with no synovitis.  No warmth or effusion of knee joints.  No tenderness or swelling of ankle joints. No tenderness of trochanteric bursa bilaterally.   CDAI Exam: CDAI Score: Not documented Patient Global Assessment: Not documented; Provider Global Assessment: Not documented Swollen: Not documented; Tender: Not documented Joint Exam   Not documented   There is currently no information documented on the homunculus. Go to the Rheumatology activity and complete the homunculus joint exam.  Investigation: No additional findings.  Imaging: No results  found.  Recent Labs: Lab Results  Component Value Date   WBC 6.9 11/12/2017   HGB 15.1 11/12/2017   PLT 174 11/12/2017   NA 141 11/12/2017   K 4.6 11/12/2017   CL 103 11/12/2017   CO2 24 11/12/2017   GLUCOSE 95 11/12/2017   BUN 22 11/12/2017   CREATININE 1.37 (H) 11/12/2017   BILITOT 0.7 11/12/2017   ALKPHOS 79 11/12/2017   AST 15 11/12/2017   ALT 14 11/12/2017   PROT 6.3 11/12/2017   ALBUMIN 4.6 11/12/2017   CALCIUM 9.9 11/12/2017   GFRAA 63 11/12/2017    Speciality Comments: No specialty comments available.  Procedures:  No procedures performed Allergies: Statins   Assessment / Plan:     Visit Diagnoses: Idiopathic chronic gout of multiple sites without tophus: He has not had any recent gout flares.  He has not needed to take Colchicine in over 6 months.  He has not joint pain  or joint swelling at this time.  He had his uric acid level checked on 11/12/17 which was 8.8.  Prior to having lab work he was eating a protein rich diet consisting of red meat and he was not compliant taking his allopurinol.  He has returned to his regular diet and is taking Allopurinol 300 mg po daily.  He was encouraged to avoid purine rich foods.  He does not need any refills at this time.  He was advised to have his uric acid rechecked in January 2020 at this PCP appointment.  He was advised to notify us if he has any gout flares.  He will follow up in our office in 6 months.   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 Need to monitor blood pressure, cholesterol, and to exercise 30-60 minutes on daily basis was discussed.   Primary osteoarthritis of both hands: He has DIP synovial thickening.  He has no synovitis.  He has complete fist formation bilaterally.  Joint protection and muscle strengthening.    Primary osteoarthritis of both knees: No warmth or effusion of knee joints.  Good ROM with no discomfort.   Medication monitoring encounter - He is taking Allopurinol  300 mg po daily.  Uric acid was 8.8 on 11/12/17.  CBC and CMP were drawn on 11/12/17.  He is following up at his PCP office for a physical in January 2020 and will have his uric acid rechecked at that time.   Other medical conditions are listed as follows:   Coccygodynia  History of renal stone  History of hyperlipidemia  History of coronary artery disease   Orders: No orders of the defined types were placed in this encounter.  No orders of the defined types were placed in this encounter.     Follow-Up Instructions: Return in about 6 months (around 06/24/2018) for Gout, Osteoarthritis.   Ofilia Neas, PA-C  Note - This record has been created using Dragon software.  Chart creation errors have been sought, but may not always  have been located. Such creation errors do not reflect on  the standard of medical care.

## 2017-12-23 ENCOUNTER — Ambulatory Visit: Payer: 59 | Admitting: Physician Assistant

## 2017-12-23 ENCOUNTER — Encounter: Payer: Self-pay | Admitting: Physician Assistant

## 2017-12-23 VITALS — BP 123/83 | HR 75 | Resp 14 | Ht 75.0 in | Wt 235.4 lb

## 2017-12-23 DIAGNOSIS — M533 Sacrococcygeal disorders, not elsewhere classified: Secondary | ICD-10-CM

## 2017-12-23 DIAGNOSIS — M19041 Primary osteoarthritis, right hand: Secondary | ICD-10-CM | POA: Diagnosis not present

## 2017-12-23 DIAGNOSIS — M170 Bilateral primary osteoarthritis of knee: Secondary | ICD-10-CM | POA: Diagnosis not present

## 2017-12-23 DIAGNOSIS — Z87442 Personal history of urinary calculi: Secondary | ICD-10-CM

## 2017-12-23 DIAGNOSIS — M1A09X0 Idiopathic chronic gout, multiple sites, without tophus (tophi): Secondary | ICD-10-CM

## 2017-12-23 DIAGNOSIS — Z5181 Encounter for therapeutic drug level monitoring: Secondary | ICD-10-CM

## 2017-12-23 DIAGNOSIS — Z8639 Personal history of other endocrine, nutritional and metabolic disease: Secondary | ICD-10-CM

## 2017-12-23 DIAGNOSIS — Z8679 Personal history of other diseases of the circulatory system: Secondary | ICD-10-CM

## 2017-12-23 DIAGNOSIS — M19042 Primary osteoarthritis, left hand: Secondary | ICD-10-CM

## 2017-12-23 NOTE — Patient Instructions (Addendum)
Please have uric acid drawn at PCP appointment

## 2017-12-25 ENCOUNTER — Ambulatory Visit: Payer: 59 | Admitting: Physician Assistant

## 2018-01-27 ENCOUNTER — Encounter: Payer: Self-pay | Admitting: Cardiovascular Disease

## 2018-01-27 DIAGNOSIS — Z0001 Encounter for general adult medical examination with abnormal findings: Secondary | ICD-10-CM | POA: Diagnosis not present

## 2018-01-27 DIAGNOSIS — M109 Gout, unspecified: Secondary | ICD-10-CM | POA: Diagnosis not present

## 2018-01-27 DIAGNOSIS — Z1389 Encounter for screening for other disorder: Secondary | ICD-10-CM | POA: Diagnosis not present

## 2018-02-02 ENCOUNTER — Other Ambulatory Visit: Payer: Self-pay | Admitting: Rheumatology

## 2018-02-02 NOTE — Telephone Encounter (Signed)
Okay to refill allopurinol.  Please have patient repeat BMP with GFR and uric acid.

## 2018-02-02 NOTE — Telephone Encounter (Signed)
Last Visit: 12/23/17 Next Visit: 06/24/18 Labs: 11/12/17 Creat. 1.37 GFR 54  Okay to refill to Refill Allopurinol?

## 2018-02-02 NOTE — Telephone Encounter (Signed)
Attempted to contact the patient and left message for patient to call the office.  

## 2018-05-22 ENCOUNTER — Other Ambulatory Visit: Payer: Self-pay | Admitting: Rheumatology

## 2018-05-22 MED ORDER — ALLOPURINOL 300 MG PO TABS: 300.0000 mg | ORAL_TABLET | Freq: Every day | ORAL | 0 refills | Status: DC

## 2018-05-22 NOTE — Telephone Encounter (Signed)
Last Visit: 12/23/17 Next Visit: 06/24/18 Labs: 11/12/17 Creat. 1.37 GFR 54  Okay to refill per Dr. Estanislado Pandy

## 2018-06-10 NOTE — Progress Notes (Signed)
Office Visit Note  Patient: Peter Burch             Date of Birth: [birth year]           MRN: [id number]             PCP: Sharilyn Sites, MD Referring: Sharilyn Sites, MD Visit Date: 06/24/2018 Occupation: @GUAROCC @  Subjective:  Medication monitoring   History of Present Illness: Peter Burch is a 63 y.o. male with history of gout and osteoarthritis.  He is taking allopurinol 150 mg po daily.  He denies any recent gout flares.  He has been avoiding triggers.  He denies any joint pain or joint swelling.  He denies any morning stiffness.  He states he was advised by PCP to reduce the dose of allopurinol due to elevated creatinine.  He has been taking allopurinol 300 mg 1/2 tablet daily and misses some days.  He denies any tophi.   Activities of Daily Living:  Patient reports morning stiffness for 0 none.   Patient Denies nocturnal pain.  Difficulty dressing/grooming: Denies Difficulty climbing stairs: Denies Difficulty getting out of chair: Denies Difficulty using hands for taps, buttons, cutlery, and/or writing: Denies  Review of Systems  Constitutional: Negative for fatigue and night sweats.  HENT: Negative for mouth sores, mouth dryness and nose dryness.   Eyes: Negative for redness, visual disturbance and dryness.  Respiratory: Negative for cough, hemoptysis, shortness of breath and difficulty breathing.   Cardiovascular: Negative for chest pain, palpitations, hypertension, irregular heartbeat and swelling in legs/feet.  Gastrointestinal: Negative for blood in stool, constipation and diarrhea.  Endocrine: Negative for increased urination.  Genitourinary: Negative for painful urination.  Musculoskeletal: Negative for arthralgias, joint pain, joint swelling, myalgias, muscle weakness, morning stiffness, muscle tenderness and myalgias.  Skin: Negative for color change, rash, hair loss, nodules/bumps, skin tightness, ulcers and sensitivity to sunlight.  Allergic/Immunologic:  Negative for susceptible to infections.  Neurological: Negative for dizziness, fainting, memory loss, night sweats and weakness.  Hematological: Negative for bruising/bleeding tendency and swollen glands.  Psychiatric/Behavioral: Negative for depressed mood and sleep disturbance. The patient is not nervous/anxious.     PMFS History:  Patient Active Problem List   Diagnosis Date Noted  . Chronic pain of right knee 02/07/2016  . History of coronary artery disease 02/05/2016  . Primary osteoarthritis of both hands 02/05/2016  . Dyslipidemia 02/05/2016  . Renal calcinosis 02/05/2016  . Idiopathic chronic gout of multiple sites without tophus 01/15/2016  . Mixed hyperlipidemia 09/01/2012  . Atherosclerosis of coronary artery of native heart without angina pectoris 09/01/2012    Past Medical History:  Diagnosis Date  . CAD (coronary artery disease)   . Gout   . Sinus bradycardia     Family History  Problem Relation Age of Onset  . Heart attack Mother   . Cancer Father        lung   Past Surgical History:  Procedure Laterality Date  . CARDIAC CATHETERIZATION  05/16/2006   40% LAD lesion  . COLONOSCOPY N/A 01/19/2015   Procedure: COLONOSCOPY;  Surgeon: Rogene Houston, MD;  Location: [facility];  Service: Endoscopy;  Laterality: N/A;  1030  . NM MYOCAR PERF WALL MOTION  07/31/2009   mod to severe defect due to infarct/scar w/mild perinfarct ischemia  . US ECHOCARDIOGRAPHY  07/31/2009   impaired LV relaxation   Social History   Social History Narrative  . Not on file    There is no immunization history  on file for this patient.   Objective: Vital Signs: BP 114/81 (BP Location: Left Arm, Patient Position: Sitting, Cuff Size: Normal)   Pulse 61   Resp 16   Ht 6\' 3"  (1.905 m)   Wt 236 lb 6.4 oz (107.2 kg)   BMI 29.55 kg/m    Physical Exam Vitals signs and nursing note reviewed.  Constitutional:      Appearance: He is well-developed.  HENT:     Head: Normocephalic and  atraumatic.  Eyes:     Conjunctiva/sclera: Conjunctivae normal.     Pupils: Pupils are equal, round, and reactive to light.  Neck:     Musculoskeletal: Normal range of motion and neck supple.  Cardiovascular:     Rate and Rhythm: Normal rate and regular rhythm.     Heart sounds: Normal heart sounds.  Pulmonary:     Effort: Pulmonary effort is normal.     Breath sounds: Normal breath sounds.  Abdominal:     General: Bowel sounds are normal.     Palpations: Abdomen is soft.  Lymphadenopathy:     Cervical: No cervical adenopathy.  Skin:    General: Skin is warm and dry.     Capillary Refill: Capillary refill takes less than 2 seconds.  Neurological:     Mental Status: He is alert and oriented to person, place, and time.  Psychiatric:        Behavior: Behavior normal.      Musculoskeletal Exam: C-spine, thoracic spine, and lumbar spine good ROM.  No midline spinal tenderness. No SI joint tenderness.  Shoulder joints, elbow joints, wrist joints, MCPs, PIPs, and DIPs good ROM with no synovitis.  PIP and DIP synovial thickening.  Dupuytren's contracture in bilateral 3rd digits. Hip joints, knee joints, ankle joint, MCPs, PIPs, and DIPs good ROM with no synovitis.  No warmth or effusion of knee joints.  No tenderness or swelling of ankle joints.  No achilles tendonitis or plantar fasciitis.   CDAI Exam: CDAI Score: - Patient Global: -; Provider Global: - Swollen: -; Tender: - Joint Exam   No joint exam has been documented for this visit   There is currently no information documented on the homunculus. Go to the Rheumatology activity and complete the homunculus joint exam.  Investigation: No additional findings.  Imaging: No results found.  Recent Labs: Lab Results  Component Value Date   WBC 6.9 11/12/2017   HGB 15.1 11/12/2017   PLT 174 11/12/2017   NA 141 11/12/2017   K 4.6 11/12/2017   CL 103 11/12/2017   CO2 24 11/12/2017   GLUCOSE 95 11/12/2017   BUN 22 11/12/2017    CREATININE 1.37 (H) 11/12/2017   BILITOT 0.7 11/12/2017   ALKPHOS 79 11/12/2017   AST 15 11/12/2017   ALT 14 11/12/2017   PROT 6.3 11/12/2017   ALBUMIN 4.6 11/12/2017   CALCIUM 9.9 11/12/2017   GFRAA 63 11/12/2017    Speciality Comments: No specialty comments available.  Procedures:  No procedures performed Allergies: Statins   Assessment / Plan:     Visit Diagnoses: Idiopathic chronic gout of multiple sites without tophus -he has not had any recent gout flares.  He is clinically doing well on allopurinol 150 mg by mouth daily.  He has no joint pain or joint swelling at this time.  He has no morning stiffness.  He has been avoiding gout triggers.  His creatinine was 1.33 and GFR was 56 on 01/27/2018.  According to the patient, his PCP  recommended reducing the dose of Allopurinol, so he has been taking 150 mg po daily.  We will check uric acid today.  He was advised to notify us if she develops signs or symptoms of a gout flare. He will follow up in 6 months.  Plan: Uric acid  Medication monitoring encounter - Allopurinol 300 mg 1/2 tablet po daily and Colchicine 0.6 mg prn.  CBC and CMP were drawn today. - Plan: COMPLETE METABOLIC PANEL WITH GFR, CBC with Differential/Platelet  Primary osteoarthritis of both hands - He has PIP and DIP synovial thickening consistent with osteoarthritis of both hands.  Complete fist formation bilaterally.  No synovitis or tenderness noted.  Joint protection and muscle strengthening were discussed.  He performs a lot of manual labor.  He was given a handout to hand exercises.   Primary osteoarthritis of both knees - He has good ROM with no discomfort.  No warmth or effusion. We discussed the importance of staying active and exercising on a regular basis.   Coccygodynia - Resolved   Other medical conditions are listed as follows:   History of renal stone   History of coronary artery disease   Dyslipidemia   Orders: Orders Placed This Encounter   Procedures  . Uric acid  . COMPLETE METABOLIC PANEL WITH GFR  . CBC with Differential/Platelet   No orders of the defined types were placed in this encounter.     Follow-Up Instructions: Return in about 6 months (around 12/24/2018) for Gout, Osteoarthritis.   Ofilia Neas, PA-C   I examined and evaluated the patient with Hazel Sams PA. Patient had no joint inflammation or tophi on my exam. The plan of care was discussed as noted above.  Bo Merino, MD  Note - This record has been created using Editor, commissioning.  Chart creation errors have been sought, but may not always  have been located. Such creation errors do not reflect on  the standard of medical care.

## 2018-06-24 ENCOUNTER — Ambulatory Visit: Payer: 59 | Admitting: Rheumatology

## 2018-06-24 ENCOUNTER — Encounter: Payer: Self-pay | Admitting: Physician Assistant

## 2018-06-24 ENCOUNTER — Other Ambulatory Visit: Payer: Self-pay

## 2018-06-24 VITALS — BP 114/81 | HR 61 | Resp 16 | Ht 75.0 in | Wt 236.4 lb

## 2018-06-24 DIAGNOSIS — M19041 Primary osteoarthritis, right hand: Secondary | ICD-10-CM

## 2018-06-24 DIAGNOSIS — M19042 Primary osteoarthritis, left hand: Secondary | ICD-10-CM

## 2018-06-24 DIAGNOSIS — Z5181 Encounter for therapeutic drug level monitoring: Secondary | ICD-10-CM

## 2018-06-24 DIAGNOSIS — M533 Sacrococcygeal disorders, not elsewhere classified: Secondary | ICD-10-CM

## 2018-06-24 DIAGNOSIS — M170 Bilateral primary osteoarthritis of knee: Secondary | ICD-10-CM | POA: Diagnosis not present

## 2018-06-24 DIAGNOSIS — E785 Hyperlipidemia, unspecified: Secondary | ICD-10-CM

## 2018-06-24 DIAGNOSIS — M1A09X0 Idiopathic chronic gout, multiple sites, without tophus (tophi): Secondary | ICD-10-CM | POA: Diagnosis not present

## 2018-06-24 DIAGNOSIS — Z87442 Personal history of urinary calculi: Secondary | ICD-10-CM

## 2018-06-24 DIAGNOSIS — Z8679 Personal history of other diseases of the circulatory system: Secondary | ICD-10-CM

## 2018-06-25 LAB — CBC WITH DIFFERENTIAL/PLATELET
Absolute Monocytes: 686 cells/uL (ref 200–950)
Basophils Absolute: 62 cells/uL (ref 0–200)
Basophils Relative: 0.8 %
Eosinophils Absolute: 445 cells/uL (ref 15–500)
Eosinophils Relative: 5.7 %
HCT: 46.8 % (ref 38.5–50.0)
Hemoglobin: 15.6 g/dL (ref 13.2–17.1)
Lymphs Abs: 1357 cells/uL (ref 850–3900)
MCH: 29.7 pg (ref 27.0–33.0)
MCHC: 33.3 g/dL (ref 32.0–36.0)
MCV: 89.1 fL (ref 80.0–100.0)
MPV: 11.1 fL (ref 7.5–12.5)
Monocytes Relative: 8.8 %
Neutro Abs: 5249 cells/uL (ref 1500–7800)
Neutrophils Relative %: 67.3 %
Platelets: 174 10*3/uL (ref 140–400)
RBC: 5.25 10*6/uL (ref 4.20–5.80)
RDW: 13.6 % (ref 11.0–15.0)
Total Lymphocyte: 17.4 %
WBC: 7.8 10*3/uL (ref 3.8–10.8)

## 2018-06-25 LAB — COMPLETE METABOLIC PANEL WITH GFR
AG Ratio: 2 (calc) (ref 1.0–2.5)
ALT: 20 U/L (ref 9–46)
AST: 18 U/L (ref 10–35)
Albumin: 4.2 g/dL (ref 3.6–5.1)
Alkaline phosphatase (APISO): 71 U/L (ref 35–144)
BUN/Creatinine Ratio: 9 (calc) (ref 6–22)
BUN: 13 mg/dL (ref 7–25)
CO2: 30 mmol/L (ref 20–32)
Calcium: 9.4 mg/dL (ref 8.6–10.3)
Chloride: 105 mmol/L (ref 98–110)
Creat: 1.41 mg/dL — ABNORMAL HIGH (ref 0.70–1.25)
GFR, Est African American: 61 mL/min/{1.73_m2} (ref >=60)
GFR, Est Non African American: 52 mL/min/{1.73_m2} — ABNORMAL LOW (ref >=60)
Globulin: 2.1 g/dL (calc) (ref 1.9–3.7)
Glucose, Bld: 90 mg/dL (ref 65–99)
Potassium: 4.3 mmol/L (ref 3.5–5.3)
Sodium: 141 mmol/L (ref 135–146)
Total Bilirubin: 0.6 mg/dL (ref 0.2–1.2)
Total Protein: 6.3 g/dL (ref 6.1–8.1)

## 2018-06-25 LAB — URIC ACID: Uric Acid, Serum: 8.5 mg/dL — ABNORMAL HIGH (ref 4.0–8.0)

## 2018-06-25 NOTE — Progress Notes (Signed)
Uric acid is elevated-8.5.  please advise patient to increase his dose of allopurinol back to 300 mg po 1 tablet daily.  Creatinine is elevated-1.41 and GFR is 52.  Please advise patient to avoid NSAIDs.  Forward labs to PCP.

## 2018-07-28 ENCOUNTER — Other Ambulatory Visit: Payer: Self-pay | Admitting: Rheumatology

## 2018-07-28 NOTE — Telephone Encounter (Signed)
Last Visit: 06/24/18 Next Visit: 1216/20 Labs: 06/24/18 Uric acid is elevated-8.5. please advise patient to increase his dose of allopurinol back to 300 mg po 1 tablet daily. Creatinine is elevated-1.41 and GFR is 52.   Okay to refill per Dr. Estanislado Pandy

## 2018-11-23 ENCOUNTER — Other Ambulatory Visit: Payer: Self-pay | Admitting: Rheumatology

## 2018-11-23 NOTE — Telephone Encounter (Signed)
Last Visit: 06/24/18 Next Visit: 1216/20 Labs: 06/24/18 Uric acid is elevated-8.5. please advise patient to increase his dose of allopurinol back to 300 mg po 1 tablet daily. Creatinine is elevated-1.41 and GFR is 52.   Okay to refill per Dr. Deveshwar  

## 2018-12-08 ENCOUNTER — Encounter (HOSPITAL_COMMUNITY): Payer: Self-pay | Admitting: Emergency Medicine

## 2018-12-08 ENCOUNTER — Emergency Department (HOSPITAL_COMMUNITY): Payer: PPO

## 2018-12-08 ENCOUNTER — Other Ambulatory Visit: Payer: Self-pay

## 2018-12-08 ENCOUNTER — Emergency Department (HOSPITAL_COMMUNITY)
Admission: EM | Admit: 2018-12-08 | Discharge: 2018-12-08 | Disposition: A | Discharge status: Home / Self Care | Payer: PPO | Attending: Emergency Medicine | Admitting: Emergency Medicine

## 2018-12-08 ENCOUNTER — Telehealth: Payer: Self-pay | Admitting: Cardiovascular Disease

## 2018-12-08 DIAGNOSIS — R079 Chest pain, unspecified: Secondary | ICD-10-CM

## 2018-12-08 DIAGNOSIS — Z79899 Other long term (current) drug therapy: Secondary | ICD-10-CM | POA: Diagnosis not present

## 2018-12-08 DIAGNOSIS — I2510 Atherosclerotic heart disease of native coronary artery without angina pectoris: Secondary | ICD-10-CM | POA: Insufficient documentation

## 2018-12-08 DIAGNOSIS — R0789 Other chest pain: Secondary | ICD-10-CM | POA: Diagnosis not present

## 2018-12-08 DIAGNOSIS — Z7982 Long term (current) use of aspirin: Secondary | ICD-10-CM | POA: Diagnosis not present

## 2018-12-08 HISTORY — DX: Pure hypercholesterolemia, unspecified: E78.00

## 2018-12-08 LAB — CBC WITH DIFFERENTIAL/PLATELET
Abs Immature Granulocytes: 0.02 10*3/uL (ref 0.00–0.07)
Basophils Absolute: 0.1 10*3/uL (ref 0.0–0.1)
Basophils Relative: 1 %
Eosinophils Absolute: 0.6 10*3/uL — ABNORMAL HIGH (ref 0.0–0.5)
Eosinophils Relative: 6 %
HCT: 45 % (ref 39.0–52.0)
Hemoglobin: 14.7 g/dL (ref 13.0–17.0)
Immature Granulocytes: 0 %
Lymphocytes Relative: 18 %
Lymphs Abs: 1.7 10*3/uL (ref 0.7–4.0)
MCH: 30.1 pg (ref 26.0–34.0)
MCHC: 32.7 g/dL (ref 30.0–36.0)
MCV: 92.2 fL (ref 80.0–100.0)
Monocytes Absolute: 0.7 10*3/uL (ref 0.1–1.0)
Monocytes Relative: 7 %
Neutro Abs: 6.3 10*3/uL (ref 1.7–7.7)
Neutrophils Relative %: 68 %
Platelets: 195 10*3/uL (ref 150–400)
RBC: 4.88 MIL/uL (ref 4.22–5.81)
RDW: 13.4 % (ref 11.5–15.5)
WBC: 9.3 10*3/uL (ref 4.0–10.5)
nRBC: 0 % (ref 0.0–0.2)

## 2018-12-08 LAB — COMPREHENSIVE METABOLIC PANEL
ALT: 26 U/L (ref 0–44)
AST: 28 U/L (ref 15–41)
Albumin: 4.2 g/dL (ref 3.5–5.0)
Alkaline Phosphatase: 70 U/L (ref 38–126)
Anion gap: 9 (ref 5–15)
BUN: 14 mg/dL (ref 8–23)
CO2: 25 mmol/L (ref 22–32)
Calcium: 8.8 mg/dL — ABNORMAL LOW (ref 8.9–10.3)
Chloride: 105 mmol/L (ref 98–111)
Creatinine, Ser: 1.3 mg/dL — ABNORMAL HIGH (ref 0.61–1.24)
GFR calc Af Amer: >60 mL/min (ref >60)
GFR calc non Af Amer: 57 mL/min — ABNORMAL LOW (ref >60)
Glucose, Bld: 97 mg/dL (ref 70–99)
Potassium: 3.9 mmol/L (ref 3.5–5.1)
Sodium: 139 mmol/L (ref 135–145)
Total Bilirubin: 0.6 mg/dL (ref 0.3–1.2)
Total Protein: 6.6 g/dL (ref 6.5–8.1)

## 2018-12-08 LAB — TROPONIN I (HIGH SENSITIVITY)
Troponin I (High Sensitivity): 2 ng/L (ref <18)
Troponin I (High Sensitivity): 2 ng/L (ref <18)

## 2018-12-08 LAB — CBG MONITORING, ED: Glucose-Capillary: 92 mg/dL (ref 70–99)

## 2018-12-08 MED ORDER — ASPIRIN 325 MG PO TABS
325.0000 mg | ORAL_TABLET | Freq: Once | ORAL | Status: AC
  Administered 2018-12-08: 325 mg via ORAL
  Filled 2018-12-08: qty 1

## 2018-12-08 NOTE — Telephone Encounter (Signed)
Spoke with pt who report for the last 2 days he has been experiencing indigestion that radiated to his back. He report yesterday he took nexium and was able to get some relief. Today he report discomfort under left arm pit that radiates down arm. He denies SOB or any other symptoms but report discomfort is not resolving with rest.  Nurse advised pt that he should report to ED for further evaluations. Pt verbalized understanding and state he will head there now.

## 2018-12-08 NOTE — Telephone Encounter (Signed)
Patient has a dull pain (2 /10 ) on the front side of his left shoulder. He says he also had some indigestion type discomfort for a couple days prior, but the patient took a Nexium and that pain was alleviated.  The patient is scheduled for an appt to see Dr. Loletha Grayer on Thursday, but was hoping to be seen sooner. The pain is just alarming to him, and he is unsure what to do.   The pain is just in his shoulder area, he would not consider the pain to be chest pain.

## 2018-12-08 NOTE — ED Triage Notes (Signed)
PT c/o left sided chest pain that started today. PT denies any SOB and pain doesn't radiate anywhere.

## 2018-12-08 NOTE — ED Provider Notes (Signed)
[facility] EMERGENCY DEPARTMENT Provider Note   CSN: [id number] Arrival date & time: 12/08/18  1341     History   Chief Complaint Chief Complaint  Patient presents with  . Chest Pain    HPI Peter Burch is a 63 y.o. male.     HPI   Pt is a 63 y/o male with a h/o CAD, gout, HLD, who presents to the ED today for eval of chest pain. States that for 2 days he has had acid reflux which is unusual for him. Today he reports pain to the left side of the chest/axillary area that started this morning. Pain has been constant since onset. he rates pain a 2/10. Pain feels like a pressure that radiates to the left shoulder. Denies that pain is associated with exertion. Denies specific exacerbating or alleviating factors.   Denies associated SOB, nausea, diaphoresis, lightheadedness, dizziness, pleuritic pain, cough. Denies pain/swelling to the legs.   Cardiology: Dr Sallyanne Kuster  Had cards appt later this week for routine f/u but called today and they told him to come here due to his CP.    Past Medical History:  Diagnosis Date  . CAD (coronary artery disease)   . Gout   . Hypercholesteremia   . Sinus bradycardia     Patient Active Problem List   Diagnosis Date Noted  . Chronic pain of right knee 02/07/2016  . History of coronary artery disease 02/05/2016  . Primary osteoarthritis of both hands 02/05/2016  . Dyslipidemia 02/05/2016  . Renal calcinosis 02/05/2016  . Idiopathic chronic gout of multiple sites without tophus 01/15/2016  . Mixed hyperlipidemia 09/01/2012  . Atherosclerosis of coronary artery of native heart without angina pectoris 09/01/2012    Past Surgical History:  Procedure Laterality Date  . CARDIAC CATHETERIZATION  05/16/2006   40% LAD lesion  . COLONOSCOPY N/A 01/19/2015   Procedure: COLONOSCOPY;  Surgeon: Rogene Houston, MD;  Location: [facility];  Service: Endoscopy;  Laterality: N/A;  1030  . NM MYOCAR PERF WALL MOTION  07/31/2009   mod to severe  defect due to infarct/scar w/mild perinfarct ischemia  . US ECHOCARDIOGRAPHY  07/31/2009   impaired LV relaxation        Home Medications    Prior to Admission medications   Medication Sig Start Date End Date Taking? Authorizing Provider  allopurinol (ZYLOPRIM) 300 MG tablet TAKE (1) TABLET BY MOUTH ONCE DAILY. Patient taking differently: Take 300 mg by mouth daily.  11/23/18  Yes Deveshwar, Abel Presto, MD  ALPRAZolam Duanne Moron) 0.5 MG tablet Take 0.25-0.5 tablets by mouth daily as needed for anxiety.  05/15/12  Yes [provider]  aspirin EC 81 MG tablet Take 81 mg by mouth daily.   Yes [provider]  Cholecalciferol (VITAMIN D-3) 125 MCG (5000 UT) TABS Take 1 tablet by mouth daily.   Yes [provider]  esomeprazole (NEXIUM 24HR) 20 MG capsule Take 20 mg by mouth daily as needed (for GERD).   Yes [provider]  ezetimibe (ZETIA) 10 MG tablet TAKE ONE TABLET BY MOUTH ONCE DAILY. Patient taking differently: Take 10 mg by mouth daily.  01/23/17  Yes Croitoru, Mihai, MD  fluticasone (FLONASE) 50 MCG/ACT nasal spray Place 1 spray into the nose as needed for allergies.  05/15/12  Yes [provider]  MITIGARE 0.6 MG CAPS Take 0.6 mg by mouth daily. Patient taking differently: Take 0.6 mg by mouth daily as needed (for pain).  07/01/17  Yes Ofilia Neas,  PA-C    Family History Family History  Problem Relation Age of Onset  . Heart attack Mother   . Cancer Father        lung    Social History Social History   Tobacco Use  . Smoking status: Never Smoker  . Smokeless tobacco: Never Used  Substance Use Topics  . Alcohol use: Yes    Comment: 1 MONTHLY  . Drug use: No     Allergies   Statins   Review of Systems Review of Systems  Constitutional: Negative for diaphoresis and fever.  HENT: Negative for ear pain and sore throat.   Eyes: Negative for visual disturbance.  Respiratory: Negative for cough and shortness of breath.    Cardiovascular: Positive for chest pain.  Gastrointestinal: Negative for abdominal pain, nausea and vomiting.  Genitourinary: Negative for dysuria and hematuria.  Musculoskeletal: Negative for back pain.  Skin: Negative for rash.  Neurological: Negative for dizziness, syncope, light-headedness and headaches.  All other systems reviewed and are negative.    Physical Exam Updated Vital Signs BP 125/89   Pulse 64   Temp 98 F (36.7 C) (Oral)   Resp 11   Ht 6\' 3"  (1.905 m)   Wt 106.6 kg   SpO2 96%   BMI 29.37 kg/m   Physical Exam Vitals signs and nursing note reviewed.  Constitutional:      Appearance: He is well-developed.  HENT:     Head: Normocephalic and atraumatic.  Eyes:     Conjunctiva/sclera: Conjunctivae normal.  Neck:     Musculoskeletal: Neck supple.  Cardiovascular:     Rate and Rhythm: Normal rate and regular rhythm.     Heart sounds: Normal heart sounds. No murmur.  Pulmonary:     Effort: Pulmonary effort is normal. No respiratory distress.     Breath sounds: Normal breath sounds. No decreased breath sounds, wheezing, rhonchi or rales.  Chest:     Chest wall: Tenderness (left upper chest musculature) present.  Abdominal:     Palpations: Abdomen is soft.     Tenderness: There is no abdominal tenderness. There is no guarding or rebound.  Musculoskeletal:     Right lower leg: He exhibits no tenderness. No edema.     Left lower leg: He exhibits no tenderness. No edema.  Skin:    General: Skin is warm and dry.  Neurological:     Mental Status: He is alert.     ED Treatments / Results  Labs (all labs ordered are listed, but only abnormal results are displayed) Labs Reviewed  COMPREHENSIVE METABOLIC PANEL - Abnormal; Notable for the following components:      Result Value   Creatinine, Ser 1.30 (*)    Calcium 8.8 (*)    GFR calc non Af Amer 57 (*)    All other components within normal limits  CBC WITH DIFFERENTIAL/PLATELET - Abnormal; Notable for  the following components:   Eosinophils Absolute 0.6 (*)    All other components within normal limits  CBG MONITORING, ED  TROPONIN I (HIGH SENSITIVITY)  TROPONIN I (HIGH SENSITIVITY)    EKG EKG Interpretation  Date/Time:  Tuesday December 08 2018 13:59:36 EST Ventricular Rate:  59 PR Interval:    QRS Duration: 103 QT Interval:  390 QTC Calculation: 387 R Axis:   5 Text Interpretation: Sinus rhythm Baseline wander in lead(s) V4 No significant change since last tracing Confirmed by Fredia Sorrow [phone]) on 12/08/2018 2:36:28 PM   Radiology Dg Chest Port 1  View  Result Date: 12/08/2018 CLINICAL DATA:  Left-sided chest pain EXAM: PORTABLE CHEST 1 VIEW COMPARISON:  03/19/2006 FINDINGS: The heart size and mediastinal contours are within normal limits. No focal airspace consolidation, pleural effusion, or pneumothorax. The visualized skeletal structures are unremarkable. IMPRESSION: No active disease. Electronically Signed   By: Davina Poke M.D.   On: 12/08/2018 15:05    Procedures Procedures (including critical care time)  Medications Ordered in ED Medications  aspirin tablet 325 mg (325 mg Oral Given 12/08/18 1546)     Initial Impression / Assessment and Plan / ED Course  I have reviewed the triage vital signs and the nursing notes.  Pertinent labs & imaging results that were available during my care of the patient were reviewed by me and considered in my medical decision making (see chart for details).   Final Clinical Impressions(s) / ED Diagnoses   Final diagnoses:  Nonspecific chest pain   63 y/o male with a h/o CAD presenting with left sided chest pain that started today. Constant since onset. No associated sxs. Nonexertional.   Mildly HTN, but otherwise VS reassuring.   CBC wnl CMP nonacute, cr at baseline Trop negative, delta trop is also negative and has remained stable.   EKG with NSR, no ischemic changes, unchanged from prior  CXR is without acute  cardiac/pulmonary abnormality   Reviewed records, Last cards note indicated that pt had "(40% proximal LAD calcified lesion by cath 2008), without symptoms of angina pectoris and with a low risk nuclear stress test in 2011. He is known to have an inferior wall defect on nuclear stress test that is a false positive abnormality (this actually led to the heart cath in 2008)."  HEART score is 44 (age, h/o CAD)  On re-evaluation pt states his sxs have improved and pain is rated at <1/10. I think his sxs today are atypical especially since I can reproduce his chest pain on exam. His workup is reassuring today. He has an appt with his cardiologist in 2 days and I advised him to keep this appt. Advised to monitor sxs and return to the ED for new or worsening symptoms. All questions answered, pt stable for d/c.   ED Discharge Orders    None       Bishop Dublin 12/08/18 1908    Daleen Bo, MD 12/08/18 2326

## 2018-12-08 NOTE — Discharge Instructions (Signed)
Please follow up with your cardiologist as scheduled in 2 days for re-evaluation.  Please return to the emergency department for any new or worsening symptoms including chest pain with exertion, shortness of breath, lightheadedness/dizziness.

## 2018-12-10 ENCOUNTER — Encounter: Payer: Self-pay | Admitting: Cardiovascular Disease

## 2018-12-10 ENCOUNTER — Ambulatory Visit: Payer: PPO | Admitting: Cardiovascular Disease

## 2018-12-10 ENCOUNTER — Other Ambulatory Visit: Payer: Self-pay

## 2018-12-10 VITALS — BP 119/84 | HR 81 | Temp 96.9°F | Ht 75.0 in | Wt 234.0 lb

## 2018-12-10 DIAGNOSIS — E782 Mixed hyperlipidemia: Secondary | ICD-10-CM | POA: Diagnosis not present

## 2018-12-10 DIAGNOSIS — R7303 Prediabetes: Secondary | ICD-10-CM | POA: Diagnosis not present

## 2018-12-10 DIAGNOSIS — E663 Overweight: Secondary | ICD-10-CM

## 2018-12-10 DIAGNOSIS — I25118 Atherosclerotic heart disease of native coronary artery with other forms of angina pectoris: Secondary | ICD-10-CM | POA: Diagnosis not present

## 2018-12-10 MED ORDER — METOPROLOL TARTRATE 100 MG PO TABS: ORAL_TABLET | ORAL | 0 refills | Status: DC

## 2018-12-10 NOTE — Progress Notes (Signed)
Cardiology Office Note    Date:  12/12/2018   ID:  Peter Burch, DOB 01/04/1956, MRN [id number]  PCP:  Sharilyn Sites, MD  Cardiologist:   Sanda Klein, MD   Chief complaint: Chest pressure   History of Present Illness:  Peter Burch is a 63 y.o. male with known mild coronary artery disease (40% proximal LAD calcified lesion by cath 2008), without symptoms of angina pectoris and with a low risk nuclear stress test in 2011. He is known to have an inferior wall defect on nuclear stress test that is a false positive abnormality (this actually led to the heart cath in 2008). He has hyperlipidemia but has been unable to tolerate numerous statins. He does take Zetia without side effects.   This is actually his yearly physical, but recently he has been having chest pressure complaints.  He had a couple of spells of "indigestion" 4 days ago and 5 days ago.  2 days ago he had persistent chest pressure along the left anterior axillary line and he went to the emergency room at [facility].  His ECG was normal and 2 consecutive high-sensitivity troponin assays were also normal.  Today his electrocardiogram is again normal, without repolarization abnormalities.  He no longer has the chest pressure today.  He has not noticed any relationship between his symptoms and physical activity.  Knee pain prevents a lot of physical exercise, but he has managed to stay out of obese range.  The patient specifically denies any dyspnea at rest or with exertion, orthopnea, paroxysmal nocturnal dyspnea, syncope, palpitations, focal neurological deficits, intermittent claudication, lower extremity edema, unexplained weight gain, cough, hemoptysis or wheezing.   Past Medical History:  Diagnosis Date  . CAD (coronary artery disease)   . Gout   . Hypercholesteremia   . Sinus bradycardia     Past Surgical History:  Procedure Laterality Date  . CARDIAC CATHETERIZATION  05/16/2006   40% LAD lesion  . COLONOSCOPY  N/A 01/19/2015   Procedure: COLONOSCOPY;  Surgeon: Rogene Houston, MD;  Location: [facility];  Service: Endoscopy;  Laterality: N/A;  1030  . NM MYOCAR PERF WALL MOTION  07/31/2009   mod to severe defect due to infarct/scar w/mild perinfarct ischemia  . US ECHOCARDIOGRAPHY  07/31/2009   impaired LV relaxation    Current Medications: Outpatient Medications Prior to Visit  Medication Sig Dispense Refill  . allopurinol (ZYLOPRIM) 300 MG tablet TAKE (1) TABLET BY MOUTH ONCE DAILY. (Patient taking differently: Take 300 mg by mouth daily. ) 30 tablet 0  . ALPRAZolam (XANAX) 0.5 MG tablet Take 0.25-0.5 tablets by mouth daily as needed for anxiety.     Marland Kitchen aspirin EC 81 MG tablet Take 81 mg by mouth daily.    . Cholecalciferol (VITAMIN D-3) 125 MCG (5000 UT) TABS Take 1 tablet by mouth daily.    Marland Kitchen esomeprazole (NEXIUM 24HR) 20 MG capsule Take 20 mg by mouth daily as needed (for GERD).    Marland Kitchen ezetimibe (ZETIA) 10 MG tablet TAKE ONE TABLET BY MOUTH ONCE DAILY. (Patient taking differently: Take 10 mg by mouth daily. ) 30 tablet 11  . fluticasone (FLONASE) 50 MCG/ACT nasal spray Place 1 spray into the nose as needed for allergies.     Marland Kitchen MITIGARE 0.6 MG CAPS Take 0.6 mg by mouth daily. (Patient taking differently: Take 0.6 mg by mouth daily as needed (for pain). ) 30 capsule 1   No facility-administered medications prior to visit.  Allergies:   Statins   Social History   Socioeconomic History  . Marital status: Married    Spouse name: Not on file  . Number of children: Not on file  . Years of education: Not on file  . Highest education level: Not on file  Occupational History  . Not on file  Social Needs  . Financial resource strain: Not on file  . Food insecurity    Worry: Not on file    Inability: Not on file  . Transportation needs    Medical: Not on file    Non-medical: Not on file  Tobacco Use  . Smoking status: Never Smoker  . Smokeless tobacco: Never Used  Substance and  Sexual Activity  . Alcohol use: Yes    Comment: 1 MONTHLY  . Drug use: No  . Sexual activity: Not on file  Lifestyle  . Physical activity    Days per week: Not on file    Minutes per session: Not on file  . Stress: Not on file  Relationships  . Social Herbalist on phone: Not on file    Gets together: Not on file    Attends religious service: Not on file    Active member of club or organization: Not on file    Attends meetings of clubs or organizations: Not on file    Relationship status: Not on file  Other Topics Concern  . Not on file  Social History Narrative  . Not on file     Family History:  The patient's family history includes Cancer in his father; Heart attack in his mother.   ROS:   Please see the history of present illness.    ROS All other systems are reviewed and are negative.   PHYSICAL EXAM:   VS:  BP 119/84   Pulse 81   Temp (!) 96.9 F (36.1 C)   Ht 6\' 3"  (1.905 m)   Wt 234 lb (106.1 kg)   SpO2 98%   BMI 29.25 kg/m      General: Alert, oriented x3, no distress, overweight, but appears fit Head: no evidence of trauma, PERRL, EOMI, no exophtalmos or lid lag, no myxedema, no xanthelasma; normal ears, nose and oropharynx Neck: normal jugular venous pulsations and no hepatojugular reflux; brisk carotid pulses without delay and no carotid bruits Chest: clear to auscultation, no signs of consolidation by percussion or palpation, normal fremitus, symmetrical and full respiratory excursions Cardiovascular: normal position and quality of the apical impulse, regular rhythm, normal first and second heart sounds, no murmurs, rubs or gallops Abdomen: no tenderness or distention, no masses by palpation, no abnormal pulsatility or arterial bruits, normal bowel sounds, no hepatosplenomegaly Extremities: no clubbing, cyanosis or edema; 2+ radial, ulnar and brachial pulses bilaterally; 2+ right femoral, posterior tibial and dorsalis pedis pulses; 2+ left  femoral, posterior tibial and dorsalis pedis pulses; no subclavian or femoral bruits Neurological: grossly nonfocal Psych: Normal mood and affect   Wt Readings from Last 3 Encounters:  12/10/18 234 lb (106.1 kg)  12/08/18 235 lb (106.6 kg)  06/24/18 236 lb 6.4 oz (107.2 kg)      Studies/Labs Reviewed:   EKG:  EKG is ordered today.  Normal sinus rhythm, normal tracing, QTC 429 ms  Recent Labs: 12/08/2018: ALT 26; BUN 14; Creatinine, Ser 1.30; Hemoglobin 14.7; Platelets 195; Potassium 3.9; Sodium 139   Lipid Panel    Component Value Date/Time   CHOL 211 (H) 11/12/2017 [id number]  TRIG 117 11/12/2017 0849   HDL 46 11/12/2017 0849   CHOLHDL 4.6 11/12/2017 0849   CHOLHDL 4.6 03/15/2014 0924   VLDL 24 03/15/2014 0924   LDLCALC 142 (H) 11/12/2017 0849      ASSESSMENT:    1. Mixed hyperlipidemia   2. Coronary artery disease involving native coronary artery of native heart with other form of angina pectoris ([place])   3. Prediabetes   4. Overweight      PLAN:  In order of problems listed above:  1. CAD: Symptoms are atypical but do demand additional evaluation.  We will schedule him for coronary CT angiography.   Note history of false positive nuclear stress test. 2. HLP: His lipid profile is typical for metabolic syndrome/insulin resistance.  Most recent LDL of 142 is unacceptably high.  He was intolerant of multiple statins.  Need to get updated labs, but anticipate we will use Repatha or Praluent. 3. PreDM: Improved glycemic control with weight loss.  Most recent hemoglobin A1c 5.6%. 4. Overweight: Continue efforts to lose some more weight.   Medication Adjustments/Labs and Tests Ordered: Current medicines are reviewed at length with the patient today.  Concerns regarding medicines are outlined above.  Medication changes, Labs and Tests ordered today are listed in the Patient Instructions below. Patient Instructions  Medication Instructions:  No changes *If you need a refill  on your cardiac medications before your next appointment, please call your pharmacy*  Lab Work: Your provider would like for you to return in one week before your cardiac ct to have the following labs drawn: fasting lipid and bmet. You do not need an appointment for the lab. Once in our office lobby there is a podium where you can sign in and ring the doorbell to alert [place] that you are here. The lab is open from 8:00 am to 4:30 pm; closed for lunch from 12:45pm-1:45pm.  If you have labs (blood work) drawn today and your tests are completely normal, you will receive your results only by: Marland Kitchen MyChart Message (if you have MyChart) OR . A paper copy in the mail If you have any lab test that is abnormal or we need to change your treatment, we will call you to review the results.  Testing/Procedures: Your physician has requested that you have cardiac CT. Cardiac computed tomography (CT) is a painless test that uses an x-ray machine to take clear, detailed pictures of your heart. For further information please visit HugeFiesta.tn. Please follow instruction sheet as given.  Follow-Up: At [facility], you and your health needs are our priority.  As part of our continuing mission to provide you with exceptional heart care, we have created designated Provider Care Teams.  These Care Teams include your primary Cardiologist (physician) and Advanced Practice Providers (APPs -  Physician Assistants and Nurse Practitioners) who all work together to provide you with the care you need, when you need it.  Your next appointment:   6 month(s)  The format for your next appointment:   In Person  Provider:   Sanda Klein, MD  Other Instructions Your cardiac CT will be scheduled at one of the below locations:   [facility] [address] [place], [place] [address] [phone]  [place] [address] [place], [place]  [address] [phone]  If scheduled at [facility], please arrive at the [facility] main entrance of [facility] 30-45 minutes prior to test start time.  Proceed to the [facility] Radiology Department (first floor) to check-in and test prep.  If scheduled at [facility], please arrive 15 mins early for check-in and test prep.  Please follow these instructions carefully (unless otherwise directed):   On the Night Before the Test: . Be sure to Drink plenty of water. . Do not consume any caffeinated/decaffeinated beverages or chocolate 12 hours prior to your test. . Do not take any antihistamines 12 hours prior to your test. . Drink plenty of water. Do not drink any water within one hour of the test. . Do not eat any food 4 hours prior to the test. . You may take your regular medications prior to the test.  . Take metoprolol (Lopressor) two hours prior to test       After the Test: . Drink plenty of water. . After receiving IV contrast, you may experience a mild flushed feeling. This is normal. . On occasion, you may experience a mild rash up to 24 hours after the test. This is not dangerous. If this occurs, you can take Benadryl 25 mg and increase your fluid intake. . If you experience trouble breathing, this can be serious. If it is severe call 911 IMMEDIATELY. If it is mild, please call our office. . If you take any of these medications: Glipizide/Metformin, Avandament, Glucavance, please do not take 48 hours after completing test unless otherwise instructed.   Once we have confirmed authorization from your insurance company, we will call you to set up a date and time for your test.   For non-scheduling related questions, please contact the cardiac imaging nurse navigator should you have any questions/concerns: Marchia Bond, RN Navigator Cardiac Imaging [facility] Heart and Vascular Services [phone] Office        Signed, Sanda Klein, MD  12/12/2018 12:45 PM    [address], [place], [place]  [address] Phone: [phone]; Fax: [phone]

## 2018-12-10 NOTE — Patient Instructions (Signed)
Medication Instructions:  No changes *If you need a refill on your cardiac medications before your next appointment, please call your pharmacy*  Lab Work: Your provider would like for you to return in one week before your cardiac ct to have the following labs drawn: fasting lipid and bmet. You do not need an appointment for the lab. Once in our office lobby there is a podium where you can sign in and ring the doorbell to alert [place] that you are here. The lab is open from 8:00 am to 4:30 pm; closed for lunch from 12:45pm-1:45pm.  If you have labs (blood work) drawn today and your tests are completely normal, you will receive your results only by: Marland Kitchen MyChart Message (if you have MyChart) OR . A paper copy in the mail If you have any lab test that is abnormal or we need to change your treatment, we will call you to review the results.  Testing/Procedures: Your physician has requested that you have cardiac CT. Cardiac computed tomography (CT) is a painless test that uses an x-ray machine to take clear, detailed pictures of your heart. For further information please visit HugeFiesta.tn. Please follow instruction sheet as given.  Follow-Up: At [facility], you and your health needs are our priority.  As part of our continuing mission to provide you with exceptional heart care, we have created designated Provider Care Teams.  These Care Teams include your primary Cardiologist (physician) and Advanced Practice Providers (APPs -  Physician Assistants and Nurse Practitioners) who all work together to provide you with the care you need, when you need it.  Your next appointment:   6 month(s)  The format for your next appointment:   In Person  Provider:   Sanda Klein, MD  Other Instructions Your cardiac CT will be scheduled at one of the below locations:   [facility] [address] [place], [place] [address] [phone]  [place] [address] [place], [place] [address] [phone]  If scheduled at [facility], please arrive at the [facility] main entrance of [facility] 30-45 minutes prior to test start time. Proceed to the [facility] Radiology Department (first floor) to check-in and test prep.  If scheduled at [facility], please arrive 15 mins early for check-in and test prep.  Please follow these instructions carefully (unless otherwise directed):   On the Night Before the Test: . Be sure to Drink plenty of water. . Do not consume any caffeinated/decaffeinated beverages or chocolate 12 hours prior to your test. . Do not take any antihistamines 12 hours prior to your test. . Drink plenty of water. Do not drink any water within one hour of the test. . Do not eat any food 4 hours prior to the test. . You may take your regular medications prior to the test.  . Take metoprolol (Lopressor) two hours prior to test       After the Test: . Drink plenty of water. . After receiving IV contrast, you may experience a mild flushed feeling. This is normal. . On occasion, you may experience a mild rash up to 24 hours after the test. This is not dangerous. If this occurs, you can take Benadryl 25 mg and increase your fluid intake. . If you experience trouble breathing, this can be serious. If it is severe call 911 IMMEDIATELY. If it is mild, please call our office. . If you take  any of these medications: Glipizide/Metformin, Avandament, Glucavance, please do not take 48 hours after completing test unless otherwise instructed.   Once we have confirmed authorization from your insurance company, we will call you to set up a date and time for your test.   For non-scheduling related questions, please contact the cardiac imaging nurse navigator should you have any questions/concerns: Marchia Bond, RN Navigator Cardiac Imaging [facility]  [facility] [phone] Office

## 2018-12-14 DIAGNOSIS — F419 Anxiety disorder, unspecified: Secondary | ICD-10-CM | POA: Diagnosis not present

## 2018-12-14 DIAGNOSIS — E6609 Other obesity due to excess calories: Secondary | ICD-10-CM | POA: Diagnosis not present

## 2018-12-14 DIAGNOSIS — Z6830 Body mass index (BMI) 30.0-30.9, adult: Secondary | ICD-10-CM | POA: Diagnosis not present

## 2018-12-21 NOTE — Progress Notes (Signed)
Virtual Visit via Video Note  I connected with Peter Burch on 12/22/18 at 10:30 AM EST by a video enabled telemedicine application and verified that I am speaking with the correct person using two identifiers.  Location: Patient: Home  Provider: Clinic This service was conducted via virtual visit.  Both audio and visual tools were used.  The patient was located at home. I was located in my office.  Consent was obtained prior to the virtual visit and is aware of possible charges through their insurance for this visit.  The patient is an established patient.  Dr. Estanislado Pandy, MD conducted the virtual visit and Hazel Sams, PA-C acted as scribe during the service.  Office staff helped with scheduling follow up visits after the service was conducted.    I discussed the limitations of evaluation and management by telemedicine and the availability of in person appointments. The patient expressed understanding and agreed to proceed.  CC: Medication monitoring  History of Present Illness: Patient is a 63 year old male with a past medical history of gout and osteoarthritis.  He has not had any recent gout flares.  He has no joint pain or joint swelling at this time.  He has morning stiffness for about 5 minutes.  He is taking allopurinol 300 mg 1 tablet daily.  He takes colchicine 0.6 mg 1 capsule as needed.  He has not taken colchicine in 6-8 months.  He states he presented to the ED on 12/08/18 with chest pain.  EKG and CXR were normal.  He followed up with his cardiologist on 12/10/18 and is scheduled for a coronary CT angiography.   Review of Systems  Constitutional: Negative for fever and malaise/fatigue.  Eyes: Negative for photophobia, pain, discharge and redness.  Respiratory: Negative for cough, shortness of breath and wheezing.   Cardiovascular: Negative for chest pain and palpitations.  Gastrointestinal: Negative for blood in stool, constipation and diarrhea.  Genitourinary: Negative for  dysuria.  Musculoskeletal: Negative for back pain, joint pain, myalgias and neck pain.  Skin: Negative for rash.  Neurological: Negative for dizziness and headaches.  Psychiatric/Behavioral: Negative for depression. The patient is not nervous/anxious and does not have insomnia.       Observations/Objective: Physical Exam  Constitutional: He is oriented to person, place, and time and well-developed, well-nourished, and in no distress.  HENT:  Head: Normocephalic and atraumatic.  Eyes: Conjunctivae are normal.  Pulmonary/Chest: Effort normal.  Neurological: He is alert and oriented to person, place, and time.  Psychiatric: Mood, memory, affect and judgment normal.   Patient reports morning stiffness for 5 minutes.   Patient denies nocturnal pain.  Difficulty dressing/grooming: Denies Difficulty climbing stairs: Denies Difficulty getting out of chair: Denies Difficulty using hands for taps, buttons, cutlery, and/or writing: Denies   Assessment and Plan: Visit Diagnoses: Idiopathic chronic gout of multiple sites without tophus - He has not had any recent gout flares.  He has no joint pain or joint inflammation at this time.  He has morning stiffness for about 5 minutes daily.  His is taking allopurinol 300 mg 1 tablet by mouth daily and colchicine 0.6 mg 1 capsule by mouth as needed.  He has not needed to take colchicine in 6-8 months.  His Uric acid was 8.5 on 06/24/18. Prior to checking uric acid he was only taking allopurinol 150 mg daily but has increased to the recommended dose.  Future order for uric acid level will be placed today.  He will follow up in  6 months.  Medication monitoring encounter - Allopurinol 300 mg 1 tablet po daily and Colchicine 0.6 mg 1 capsule by mouth daily as needed.  CBC and CMP were drawn on 12/08/18. GFR improved from 52 to 57 on 12/08/18.  Uric acid was 8.5 on 06/24/18.   Primary osteoarthritis of both hands-He is not having any increased hand pain or  inflammation. Joint protection and muscle strengthening were discussed.   Primary osteoarthritis of both knees - He experiences stiffness in both knee joints but no inflammation. He experiences some difficulty when rising from a seated position.  We discussed the importance of regular exercise and lower extremity muscle strengthening.   Coccygodynia - Resolved   History of coronary artery disease: He presented to the ED on 12/08/18 with chest pain. EKG revealed NSR and CXR was unremarkable.  He followed up with Dr. Sallyanne Kuster on 12/10/18. He is scheduled for a coronary CT angiography.   Other medical conditions are listed as follows:   History of renal stone   Dyslipidemia    Follow Up Instructions: He will follow up in 6 months.    I discussed the assessment and treatment plan with the patient. The patient was provided an opportunity to ask questions and all were answered. The patient agreed with the plan and demonstrated an understanding of the instructions.   The patient was advised to call back or seek an in-person evaluation if the symptoms worsen or if the condition fails to improve as anticipated.  I provided 15 minutes of non-face-to-face time during this encounter.   Bo Merino, MD    Scribed by-  Hazel Sams, PA-C

## 2018-12-22 ENCOUNTER — Encounter: Payer: Self-pay | Admitting: Rheumatology

## 2018-12-22 ENCOUNTER — Other Ambulatory Visit: Payer: Self-pay

## 2018-12-22 ENCOUNTER — Telehealth (INDEPENDENT_AMBULATORY_CARE_PROVIDER_SITE_OTHER): Payer: PPO | Admitting: Rheumatology

## 2018-12-22 DIAGNOSIS — M19041 Primary osteoarthritis, right hand: Secondary | ICD-10-CM

## 2018-12-22 DIAGNOSIS — Z8739 Personal history of other diseases of the musculoskeletal system and connective tissue: Secondary | ICD-10-CM | POA: Diagnosis not present

## 2018-12-22 DIAGNOSIS — E785 Hyperlipidemia, unspecified: Secondary | ICD-10-CM | POA: Diagnosis not present

## 2018-12-22 DIAGNOSIS — Z8639 Personal history of other endocrine, nutritional and metabolic disease: Secondary | ICD-10-CM

## 2018-12-22 DIAGNOSIS — M19042 Primary osteoarthritis, left hand: Secondary | ICD-10-CM | POA: Diagnosis not present

## 2018-12-22 DIAGNOSIS — Z5181 Encounter for therapeutic drug level monitoring: Secondary | ICD-10-CM

## 2018-12-22 DIAGNOSIS — M1A09X0 Idiopathic chronic gout, multiple sites, without tophus (tophi): Secondary | ICD-10-CM | POA: Diagnosis not present

## 2018-12-22 DIAGNOSIS — Z8679 Personal history of other diseases of the circulatory system: Secondary | ICD-10-CM | POA: Diagnosis not present

## 2018-12-22 DIAGNOSIS — M170 Bilateral primary osteoarthritis of knee: Secondary | ICD-10-CM

## 2018-12-22 DIAGNOSIS — Z87442 Personal history of urinary calculi: Secondary | ICD-10-CM | POA: Diagnosis not present

## 2018-12-22 DIAGNOSIS — M533 Sacrococcygeal disorders, not elsewhere classified: Secondary | ICD-10-CM

## 2018-12-23 ENCOUNTER — Telehealth: Payer: PPO | Admitting: Rheumatology

## 2019-01-04 ENCOUNTER — Other Ambulatory Visit: Payer: Self-pay | Admitting: Rheumatology

## 2019-01-04 NOTE — Telephone Encounter (Signed)
Last Visit: 12/22/2018 telemedicine  Next Visit: 06/22/2019 Labs: 12/08/2018 GFR improved from 52 to 57 on 12/08/18.  Uric acid was 8.5 on 06/24/18.   Okay to refill per Dr. Estanislado Pandy.

## 2019-01-19 ENCOUNTER — Encounter (HOSPITAL_COMMUNITY): Payer: Self-pay

## 2019-01-20 ENCOUNTER — Other Ambulatory Visit: Payer: Self-pay

## 2019-01-20 ENCOUNTER — Other Ambulatory Visit (HOSPITAL_COMMUNITY)
Admission: RE | Admit: 2019-01-20 | Discharge: 2019-01-20 | Disposition: A | Discharge status: Home / Self Care | Payer: PPO | Source: Ambulatory Visit | Attending: Cardiovascular Disease | Admitting: Cardiovascular Disease

## 2019-01-20 ENCOUNTER — Telehealth (HOSPITAL_COMMUNITY): Payer: Self-pay | Admitting: Emergency Medicine

## 2019-01-20 DIAGNOSIS — E782 Mixed hyperlipidemia: Secondary | ICD-10-CM | POA: Insufficient documentation

## 2019-01-20 DIAGNOSIS — E785 Hyperlipidemia, unspecified: Secondary | ICD-10-CM | POA: Diagnosis not present

## 2019-01-20 LAB — LIPID PANEL
Cholesterol: 197 mg/dL (ref 0–200)
HDL: 41 mg/dL (ref >40)
LDL Cholesterol: 120 mg/dL — ABNORMAL HIGH (ref 0–99)
Total CHOL/HDL Ratio: 4.8 RATIO
Triglycerides: 178 mg/dL — ABNORMAL HIGH (ref <150)
VLDL: 36 mg/dL (ref 0–40)

## 2019-01-20 NOTE — Telephone Encounter (Signed)
Text to patient confirming that he can have labs drawn at [facility] today. Pt verbalized understanding.  Denies further questions or concerns regarding patient instructions for CCTA tomorrow.  Marchia Bond RN Navigator Cardiac Imaging [facility] Heart and Vascular [phone] office [phone] cell

## 2019-01-21 ENCOUNTER — Ambulatory Visit
Admission: RE | Admit: 2019-01-21 | Discharge: 2019-01-21 | Disposition: A | Discharge status: Home / Self Care | Payer: PPO | Source: Ambulatory Visit | Attending: Cardiovascular Disease | Admitting: Cardiovascular Disease

## 2019-01-21 ENCOUNTER — Other Ambulatory Visit: Payer: Self-pay

## 2019-01-21 DIAGNOSIS — I700 Atherosclerosis of aorta: Secondary | ICD-10-CM | POA: Insufficient documentation

## 2019-01-21 DIAGNOSIS — I25118 Atherosclerotic heart disease of native coronary artery with other forms of angina pectoris: Secondary | ICD-10-CM

## 2019-01-21 LAB — POCT I-STAT CREATININE: Creatinine, Ser: 1.3 mg/dL — ABNORMAL HIGH (ref 0.61–1.24)

## 2019-01-21 MED ORDER — NITROGLYCERIN 0.4 MG SL SUBL
0.8000 mg | SUBLINGUAL_TABLET | Freq: Once | SUBLINGUAL | Status: AC
  Administered 2019-01-21: 0.8 mg via SUBLINGUAL

## 2019-01-21 MED ORDER — IOHEXOL 350 MG/ML SOLN
100.0000 mL | Freq: Once | INTRAVENOUS | Status: AC | PRN
  Administered 2019-01-21: 100 mL via INTRAVENOUS

## 2019-01-21 MED ORDER — METOPROLOL TARTRATE 5 MG/5ML IV SOLN
5.0000 mg | INTRAVENOUS | Status: DC | PRN
  Administered 2019-01-21: 5 mg via INTRAVENOUS

## 2019-01-21 NOTE — Progress Notes (Signed)
Patient tolerated CT without incident. Drank water after. Ambulatory steady gait to exit.

## 2019-01-22 DIAGNOSIS — I25118 Atherosclerotic heart disease of native coronary artery with other forms of angina pectoris: Secondary | ICD-10-CM | POA: Diagnosis not present

## 2019-01-26 ENCOUNTER — Telehealth: Payer: Self-pay

## 2019-01-26 MED ORDER — REPATHA SURECLICK 140 MG/ML ~~LOC~~ SOAJ: 140.0000 mg | SUBCUTANEOUS | 11 refills | Status: DC

## 2019-01-26 NOTE — Telephone Encounter (Signed)
CALLED THE PT AND SET THEM UP W/[facility] [facility]. THE PT THANKED ME AND VOICED UNDERSTANDING

## 2019-01-26 NOTE — Telephone Encounter (Signed)
Called and spoke w/pt regarding the approval for the repatha. rx sent, pt voiced understanding, and instructed to apply for [employer] if unaffordable.

## 2019-01-29 DIAGNOSIS — E6609 Other obesity due to excess calories: Secondary | ICD-10-CM | POA: Diagnosis not present

## 2019-01-29 DIAGNOSIS — Z6830 Body mass index (BMI) 30.0-30.9, adult: Secondary | ICD-10-CM | POA: Diagnosis not present

## 2019-01-29 DIAGNOSIS — E7849 Other hyperlipidemia: Secondary | ICD-10-CM | POA: Diagnosis not present

## 2019-01-29 DIAGNOSIS — Z0001 Encounter for general adult medical examination with abnormal findings: Secondary | ICD-10-CM | POA: Diagnosis not present

## 2019-02-18 DIAGNOSIS — B078 Other viral warts: Secondary | ICD-10-CM | POA: Diagnosis not present

## 2019-04-07 DIAGNOSIS — I2510 Atherosclerotic heart disease of native coronary artery without angina pectoris: Secondary | ICD-10-CM | POA: Diagnosis not present

## 2019-04-07 DIAGNOSIS — F419 Anxiety disorder, unspecified: Secondary | ICD-10-CM | POA: Diagnosis not present

## 2019-04-07 DIAGNOSIS — G2581 Restless legs syndrome: Secondary | ICD-10-CM | POA: Diagnosis not present

## 2019-04-07 DIAGNOSIS — E785 Hyperlipidemia, unspecified: Secondary | ICD-10-CM | POA: Diagnosis not present

## 2019-04-19 ENCOUNTER — Other Ambulatory Visit: Payer: Self-pay | Admitting: Rheumatology

## 2019-04-19 NOTE — Telephone Encounter (Signed)
Last Visit: 12/22/2018 telemedicine  Next Visit: 06/22/2019 Labs: 12/08/2018 GFR improved from 52 to 57 on 12/08/18. Uric acid was 8.5 on 06/24/18.   Okay to refill per Dr. Estanislado Pandy

## 2019-05-07 DIAGNOSIS — I10 Essential (primary) hypertension: Secondary | ICD-10-CM | POA: Diagnosis not present

## 2019-05-07 DIAGNOSIS — I25118 Atherosclerotic heart disease of native coronary artery with other forms of angina pectoris: Secondary | ICD-10-CM | POA: Diagnosis not present

## 2019-05-07 DIAGNOSIS — M109 Gout, unspecified: Secondary | ICD-10-CM | POA: Diagnosis not present

## 2019-05-07 DIAGNOSIS — G2581 Restless legs syndrome: Secondary | ICD-10-CM | POA: Diagnosis not present

## 2019-05-20 ENCOUNTER — Other Ambulatory Visit: Payer: Self-pay | Admitting: Rheumatology

## 2019-05-20 NOTE — Telephone Encounter (Signed)
Okay to refill allopurinol at the current dose.

## 2019-05-20 NOTE — Telephone Encounter (Signed)
Last Visit: 12/22/2019 Next Visit: 06/22/2019 Labs: 12/08/2019 Creat 1.30 GFR 57 Calcium 8.8 Eosinphils Absolute 0.6  Current Dose per office note 12/22/2019:  allopurinol 300 mg 1 tablet by mouth daily   Okay to refill Allopurinol?

## 2019-05-26 DIAGNOSIS — D1801 Hemangioma of skin and subcutaneous tissue: Secondary | ICD-10-CM | POA: Diagnosis not present

## 2019-06-08 NOTE — Progress Notes (Signed)
Office Visit Note  Patient: Peter Burch             Date of Birth: 02/06/1955           MRN: [id number]             PCP: Sharilyn Sites, MD Referring: Sharilyn Sites, MD Visit Date: 06/22/2019 Occupation: @GUAROCC @  Subjective:  Medication monitoring   History of Present Illness: Peter Burch is a 64 y.o. male with history of gout and osteoarthritis.  He is taking allopurinol 300 mg 1 tablet by mouth daily.  He takes colchicine 0.6 mg 1 capsule daily as needed during gout flares.  He has not needed to take colchicine recently.  He denies any joint pain or joint swelling at this time.  He denies any new concerns.   Activities of Daily Living:  Patient reports morning stiffness for  Less than 1  minute.   Patient Denies nocturnal pain.  Difficulty dressing/grooming: Denies Difficulty climbing stairs: Denies Difficulty getting out of chair: Denies Difficulty using hands for taps, buttons, cutlery, and/or writing: Denies  Review of Systems  Constitutional: Negative for fatigue and night sweats.  HENT: Negative for mouth sores, mouth dryness and nose dryness.   Eyes: Negative for redness, itching and dryness.  Respiratory: Negative for cough, hemoptysis, shortness of breath and difficulty breathing.   Cardiovascular: Negative for chest pain, palpitations, hypertension, irregular heartbeat and swelling in legs/feet.  Gastrointestinal: Negative for blood in stool, constipation and diarrhea.  Endocrine: Negative for increased urination.  Genitourinary: Negative for difficulty urinating and painful urination.  Musculoskeletal: Positive for morning stiffness. Negative for arthralgias, joint pain, joint swelling, myalgias, muscle weakness, muscle tenderness and myalgias.  Skin: Negative for color change, rash, hair loss, nodules/bumps, redness, skin tightness, ulcers and sensitivity to sunlight.  Allergic/Immunologic: Negative for susceptible to infections.  Neurological: Negative for  dizziness, fainting, numbness, headaches, memory loss, night sweats and weakness.  Hematological: Negative for bruising/bleeding tendency and swollen glands.  Psychiatric/Behavioral: Positive for sleep disturbance. Negative for depressed mood and confusion. The patient is not nervous/anxious.     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/05/2016   Idiopathic chronic gout of multiple sites without tophus 01/15/2016   Mixed hyperlipidemia 09/01/2012   Atherosclerosis of coronary artery of native heart without angina pectoris 09/01/2012    Past Medical History:  Diagnosis Date   CAD (coronary artery disease)    Gout    Hypercholesteremia    Sinus bradycardia     Family History  Problem Relation Age of Onset   Heart attack Mother    Cancer Father        lung   Past Surgical History:  Procedure Laterality Date   CARDIAC CATHETERIZATION  05/16/2006   40% LAD lesion   COLONOSCOPY N/A 01/19/2015   Procedure: COLONOSCOPY;  Surgeon: Rogene Houston, MD;  [facility];  Service: Endoscopy;  Laterality: N/A;  [place]  07/31/2009   mod to severe defect due to infarct/scar w/mild perinfarct ischemia   US ECHOCARDIOGRAPHY  07/31/2009   impaired LV relaxation   Social History   Social History Narrative   Not on file    There is no immunization history on file for this patient.   Objective:  Vital Signs: BP 116/78 (BP Location: Left Arm, Patient Position: Sitting, Cuff Size: Normal)    Pulse 71    Resp 17    Ht 6\' 3"  (1.905 m)    Wt 240 lb 3.2 oz (109 kg)    BMI 30.02 kg/m    Physical Exam Vitals and nursing note reviewed.  Constitutional:      Appearance: He is well-developed.  HENT:     Head: Normocephalic and atraumatic.  Eyes:      Conjunctiva/sclera: Conjunctivae normal.     Pupils: Pupils are equal, round, and reactive to light.  Pulmonary:     Effort: Pulmonary effort is normal.  Abdominal:     General: Bowel sounds are normal.     Palpations: Abdomen is soft.  Musculoskeletal:     Cervical back: Normal range of motion and neck supple.  Skin:    General: Skin is warm and dry.     Capillary Refill: Capillary refill takes less than 2 seconds.  Neurological:     Mental Status: He is alert and oriented to person, place, and time.  Psychiatric:        Behavior: Behavior normal.      Musculoskeletal Exam: C-spine, thoracic spine, and lumbar spine good ROM.  Shoulder joints, elbow joints, wrist joints, MCPs, PIPs, and DIPs good ROM with no synovitis. Bilateral dupuytren's contracture 3rd flexor tendon. PIP and DIP thickening consistent with osteoarthritis of both hands. Complete fist formation bilaterally.  Hip joints, knee joints, and ankle joints good ROM with no discomfort.  No warmth or effusion of knee joints.  No tenderness or swelling of ankle joints.   CDAI Exam: CDAI Score: -- Patient Global: --; Provider Global: -- Swollen: --; Tender: -- Joint Exam 06/22/2019   No joint exam has been documented for this visit   There is currently no information documented on the homunculus. Go to the Rheumatology activity and complete the homunculus joint exam.  Investigation: No additional findings.  Imaging: No results found.  Recent Labs: Lab Results  Component Value Date   WBC 9.3 12/08/2018   HGB 14.7 12/08/2018   PLT 195 12/08/2018   NA 139 12/08/2018   K 3.9 12/08/2018   CL 105 12/08/2018   CO2 25 12/08/2018   GLUCOSE 97 12/08/2018   BUN 14 12/08/2018   CREATININE 1.30 (H) 01/21/2019   BILITOT 0.6 12/08/2018   ALKPHOS 70 12/08/2018   AST 28 12/08/2018   ALT 26 12/08/2018   PROT 6.6 12/08/2018   ALBUMIN 4.2 12/08/2018   CALCIUM 8.8 (L) 12/08/2018   GFRAA >60 12/08/2018    Speciality  Comments: No specialty comments available.  Procedures:  No procedures performed Allergies: Statins   Assessment / Plan:     Visit Diagnoses: Idiopathic chronic gout of multiple sites without tophus -He has not had any recent gout flares.  He is clinically doing well on allopurinol 300 mg 1 tablet by mouth daily and colchicine 0.6 mg 1 capsule by mouth as needed during flares.  He is tolerating allopurinol without any side effects.  He has not needed to take colchicine recently.  He has no joint pain or inflammation at this time.  His uric acid was 8.5 on 06/24/18, which was while he was taking allopurinol 150 mg daily.  We will recheck uric acid today since he has been taking allopurinol 300 mg for about 1 year now.  He does not need any refills of allopurinol or colchicine at this time.  He  was advised to notify us if he develops signs or symptoms of a gout flare.  He will follow up in 6 months.  Plan: Uric acid, CBC with Differential/Platelet, COMPLETE METABOLIC PANEL WITH GFR  Association of heart disease with gout was discussed. Need to monitor blood pressure, cholesterol, and to exercise 30-60 minutes on daily basis was discussed. Avoid red meat,shellfish, and alcohol .  Medication monitoring encounter - Allopurinol 300 mg 1 tablet po daily and Colchicine 0.6 mg 1 capsule by mouth daily as needed. uric acid: 06/24/2018 8.5 - Plan: Uric acid, CBC with Differential/Platelet, COMPLETE METABOLIC PANEL WITH GFR  Primary osteoarthritis of both hands: He has PIP and DIP thickening consistent with osteoarthritis of both hands.  No tenderness or inflammation noted.  Joint protection and muscle strengthening were discussed.   Primary osteoarthritis of both knees: He is not having any discomfort in his knee joints at this time.  He has good ROM of both knees with no discomfort.  No warmth or effusion noted.  He was encouraged to start exercising on a daily basis.   Coccygodynia - Resolved   Other medical  conditions are listed as follows:   History of renal stone  History of coronary artery disease  Dyslipidemia  Orders: Orders Placed This Encounter  Procedures   Uric acid   CBC with Differential/Platelet   COMPLETE METABOLIC PANEL WITH GFR   No orders of the defined types were placed in this encounter.    Follow-Up Instructions: Return in about 6 months (around 12/22/2019) for Gout, Osteoarthritis.   Hazel Sams, PA-C  I examined and evaluated the patient with Hazel Sams PA.  Patient had no synovitis on examination.  His gout seems to be very well controlled.  Continues to have some discomfort from underlying osteoarthritis.  Joint protection was discussed.  The plan of care was discussed as noted above.  Bo Merino, MD  Note - This record has been created using Editor, commissioning.  Chart creation errors have been sought, but may not always  have been located. Such creation errors do not reflect on  the standard of medical care.

## 2019-06-16 ENCOUNTER — Other Ambulatory Visit: Payer: Self-pay | Admitting: Rheumatology

## 2019-06-16 ENCOUNTER — Encounter: Payer: Self-pay | Admitting: Cardiology

## 2019-06-16 ENCOUNTER — Telehealth (INDEPENDENT_AMBULATORY_CARE_PROVIDER_SITE_OTHER): Payer: PPO | Admitting: Cardiology

## 2019-06-16 VITALS — Ht 75.0 in

## 2019-06-16 DIAGNOSIS — Z8679 Personal history of other diseases of the circulatory system: Secondary | ICD-10-CM

## 2019-06-16 DIAGNOSIS — M1A09X0 Idiopathic chronic gout, multiple sites, without tophus (tophi): Secondary | ICD-10-CM

## 2019-06-16 DIAGNOSIS — E782 Mixed hyperlipidemia: Secondary | ICD-10-CM

## 2019-06-16 NOTE — Addendum Note (Signed)
Addended by: Therisa Doyne on: 06/16/2019 09:39 AM   Modules accepted: Orders

## 2019-06-16 NOTE — Patient Instructions (Signed)
Medication Instructions:  Your physician recommends that you continue on your current medications as directed. Please refer to the Current Medication list given to you today.  *If you need a refill on your cardiac medications before your next appointment, please call your pharmacy*   Lab Work: Your physician recommends that you return for a FASTING lipid profile at [facility] in [place].  If you have labs (blood work) drawn today and your tests are completely normal, you will receive your results only by:  [place] (if you have MyChart) OR  A paper copy in the mail If you have any lab test that is abnormal or we need to change your treatment, we will call you to review the results.  Follow-Up: At [facility], you and your health needs are our priority.  As part of our continuing mission to provide you with exceptional heart care, we have created designated Provider Care Teams.  These Care Teams include your primary Cardiologist (physician) and Advanced Practice Providers (APPs -  Physician Assistants and Nurse Practitioners) who all work together to provide you with the care you need, when you need it.  We recommend signing up for the patient portal called "MyChart".  Sign up information is provided on this After Visit Summary.  MyChart is used to connect with patients for Virtual Visits (Telemedicine).  Patients are able to view lab/test results, encounter notes, upcoming appointments, etc.  Non-urgent messages can be sent to your provider as well.   To learn more about what you can do with MyChart, go to [email or web address].    Your next appointment:   6 month(s)  The format for your next appointment:   In Person  Provider:   You may see Sanda Klein, MD or one of the following Advanced Practice Providers on your designated Care Team:    Almyra Deforest, PA-C  Fabian Sharp, [place] or   Roby Lofts, [place]    Other Instructions Please call our office 2 months in  advance to schedule your follow-up appointment with Dr. Sallyanne Kuster.

## 2019-06-16 NOTE — Progress Notes (Signed)
Thanks

## 2019-06-16 NOTE — Telephone Encounter (Signed)
Last Visit: 12/22/2019 Next Visit: 06/22/2019 Labs: 12/08/2019 Creat 1.30 GFR 57 Calcium 8.8 Eosinphils Absolute 0.6  Current Dose per office note 12/22/2019: allopurinol 300 mg 1 tablet by mouth daily   Okay to refill per Dr. Estanislado Pandy

## 2019-06-16 NOTE — Progress Notes (Signed)
Virtual Visit via Telephone Note   This visit type was conducted due to national recommendations for restrictions regarding the COVID-19 Pandemic (e.g. social distancing) in an effort to limit this patient's exposure and mitigate transmission in our community.  Due to his co-morbid illnesses, this patient is at least at moderate risk for complications without adequate follow up.  This format is felt to be most appropriate for this patient at this time.  The patient did not have access to video technology/had technical difficulties with video requiring transitioning to audio format only (telephone).  All issues noted in this document were discussed and addressed.  No physical exam could be performed with this format.  Please refer to the patient's chart for his  consent to telehealth for [facility].   The patient was identified using 2 identifiers.  Date:  06/16/2019   ID:  Peter Burch, DOB October 14, 1955, MRN [id number]  Patient Location: Home Provider Location: Home  PCP:  Sharilyn Sites, MD  Cardiologist:  Sanda Klein, MD  Electrophysiologist:  None   Evaluation Performed:  Follow-Up Visit  Chief Complaint:  noone  History of Present Illness:    Peter Burch is a 64 y.o. male from [place] with a history of coronary disease.  He had a catheterization in 2008 after a Myoview suggested inferior ischemia.  Catheterization showed a 40% LAD.  He has a history of dyslipidemia and has been intolerant to statins in the past.  His last office visit was in December 2020.  After this visit a coronary CT was obtained which showed moderate calcification in the proximal and mid LAD, the circumflex and RCA were clear.  His calcium score put him in the 95th percentile.  Based on this Repatha was recommended.  The patient was contacted today for follow-up.  He tells me he has been on the [place] since January and is tolerating it well.  He denies any unusual chest pain.  He has had problems with  his weight, he started walking 1 to 2 miles a day a couple weeks ago.  The patient does not have symptoms concerning for COVID-19 infection (fever, chills, cough, or new shortness of breath).    Past Medical History:  Diagnosis Date  . CAD (coronary artery disease)   . Gout   . Hypercholesteremia   . Sinus bradycardia    Past Surgical History:  Procedure Laterality Date  . CARDIAC CATHETERIZATION  05/16/2006   40% LAD lesion  . COLONOSCOPY N/A 01/19/2015   Procedure: COLONOSCOPY;  Surgeon: Rogene Houston, MD;  [facility];  Service: Endoscopy;  Laterality: N/A;  1030  . NM MYOCAR PERF WALL MOTION  07/31/2009   mod to severe defect due to infarct/scar w/mild perinfarct ischemia  . US ECHOCARDIOGRAPHY  07/31/2009   impaired LV relaxation     Current Meds  Medication Sig  . allopurinol (ZYLOPRIM) 300 MG tablet TAKE (1) TABLET BY MOUTH ONCE DAILY.  Marland Kitchen ALPRAZolam (XANAX) 0.5 MG tablet Take 0.25-0.5 tablets by mouth daily as needed for anxiety.   Marland Kitchen aspirin EC 81 MG tablet Take 81 mg by mouth daily.  . Cholecalciferol (VITAMIN D-3) 125 MCG (5000 UT) TABS Take 1 tablet by mouth daily.  Marland Kitchen esomeprazole (NEXIUM 24HR) 20 MG capsule Take 20 mg by mouth daily as needed (for GERD).  . Evolocumab (REPATHA SURECLICK) 185 MG/ML SOAJ Inject 140 mg into the skin every 14 (fourteen) days.  Marland Kitchen ezetimibe (ZETIA) 10 MG tablet TAKE ONE TABLET  BY MOUTH ONCE DAILY. (Patient taking differently: Take 10 mg by mouth daily. )  . fluticasone (FLONASE) 50 MCG/ACT nasal spray Place 1 spray into the nose as needed for allergies.   . metoprolol tartrate (LOPRESSOR) 100 MG tablet Take two hours before of the test  . MITIGARE 0.6 MG CAPS Take 0.6 mg by mouth daily. (Patient taking differently: Take 0.6 mg by mouth daily as needed (for pain). )     Allergies:   Statins   Social History   Tobacco Use  . Smoking status: Never Smoker  . Smokeless tobacco: Never Used  Substance Use Topics  . Alcohol use:  Yes    Comment: 1 MONTHLY  . Drug use: No     Family Hx: The patient's family history includes Cancer in his father; Heart attack in his mother.  ROS:   Please see the history of present illness.    All other systems reviewed and are negative.   Prior CV studies:   The following studies were reviewed today:  Coronary CT Jan 2021- IMPRESSION: 1. Coronary calcium score of 1351. This was 95th percentile for age and sex matched control.  2. Normal coronary origin with right dominance.  3.  No obstructive Calcification in the RCA and LCx arteries  4. Heavily calcified proximal and mid LAD causing at least moderate stenosis. Significant stenosis could not be excluded due to calcium/blooming artifact.  5. Atleast CAD-RADS 3. Moderate stenosis in the LAD. Consider symptom-guided anti-ischemic pharmacotherapy as well as risk factor modification per guideline directed care. Additional analysis with CT FFR will be submitted.  Labs/Other Tests and Data Reviewed:    EKG:  An ECG dated 12/10/2018 was personally reviewed today and demonstrated:  NSR-HR 76  Recent Labs: 12/08/2018: ALT 26; BUN 14; Hemoglobin 14.7; Platelets 195; Potassium 3.9; Sodium 139 01/21/2019: Creatinine, Ser 1.30   Recent Lipid Panel Lab Results  Component Value Date/Time   CHOL 197 01/20/2019 11:28 AM   CHOL 211 (H) 11/12/2017 08:49 AM   TRIG 178 (H) 01/20/2019 11:28 AM   HDL 41 01/20/2019 11:28 AM   HDL 46 11/12/2017 08:49 AM   CHOLHDL 4.8 01/20/2019 11:28 AM   LDLCALC 120 (H) 01/20/2019 11:28 AM   LDLCALC 142 (H) 11/12/2017 08:49 AM    Wt Readings from Last 3 Encounters:  12/10/18 234 lb (106.1 kg)  12/08/18 235 lb (106.6 kg)  06/24/18 236 lb 6.4 oz (107.2 kg)     Objective:    Vital Signs:  Ht 6\' 3"  (1.905 m)   BMI 29.25 kg/m    VITAL SIGNS:  reviewed  ASSESSMENT & PLAN:    CAD- 40% LAD stenosis with mild scattered calcifications by cardiac catheterization in 2008 after false  positive Myoview.  Normal left ventricular systolic function. Coronary CT done Jan 2021- moderate proximal and mid LAD Ca++.  Ca++ score 95th percentile. Aggressive lipid Rx recommended.  HLD- Intolerant to multiple statins including Crestor, Vytorin, Livalo. Repatha prescribed Jan 2021  Plan: Check fasting lipids- f/u Dr Sallyanne Kuster in 6 months.   COVID-19 Education: The signs and symptoms of COVID-19 were discussed with the patient and how to seek care for testing (follow up with PCP or arrange E-visit).  The importance of social distancing was discussed today.  Time:   Today, I have spent 10 minutes with the patient with telehealth technology discussing the above problems.     Medication Adjustments/Labs and Tests Ordered: Current medicines are reviewed at length with the patient today.  Concerns regarding medicines are outlined above.   Tests Ordered: No orders of the defined types were placed in this encounter.   Medication Changes: No orders of the defined types were placed in this encounter.   Follow Up:  In Person Dr Sallyanne Kuster in 6 months  Signed, Kerin Ransom, Hershal Coria  06/16/2019 8:22 AM    [place]

## 2019-06-17 DIAGNOSIS — E782 Mixed hyperlipidemia: Secondary | ICD-10-CM | POA: Diagnosis not present

## 2019-06-18 LAB — LIPID PANEL
Chol/HDL Ratio: 2.5 ratio (ref 0.0–5.0)
Cholesterol, Total: 121 mg/dL (ref 100–199)
HDL: 48 mg/dL (ref >39)
LDL Chol Calc (NIH): 49 mg/dL (ref 0–99)
Triglycerides: 141 mg/dL (ref 0–149)
VLDL Cholesterol Cal: 24 mg/dL (ref 5–40)

## 2019-06-22 ENCOUNTER — Other Ambulatory Visit: Payer: Self-pay

## 2019-06-22 ENCOUNTER — Encounter: Payer: Self-pay | Admitting: Physician Assistant

## 2019-06-22 ENCOUNTER — Ambulatory Visit: Payer: PPO | Admitting: Rheumatology

## 2019-06-22 VITALS — BP 116/78 | HR 71 | Resp 17 | Ht 75.0 in | Wt 240.2 lb

## 2019-06-22 DIAGNOSIS — M533 Sacrococcygeal disorders, not elsewhere classified: Secondary | ICD-10-CM | POA: Diagnosis not present

## 2019-06-22 DIAGNOSIS — M170 Bilateral primary osteoarthritis of knee: Secondary | ICD-10-CM | POA: Diagnosis not present

## 2019-06-22 DIAGNOSIS — Z87442 Personal history of urinary calculi: Secondary | ICD-10-CM | POA: Diagnosis not present

## 2019-06-22 DIAGNOSIS — M19042 Primary osteoarthritis, left hand: Secondary | ICD-10-CM | POA: Diagnosis not present

## 2019-06-22 DIAGNOSIS — Z5181 Encounter for therapeutic drug level monitoring: Secondary | ICD-10-CM

## 2019-06-22 DIAGNOSIS — E785 Hyperlipidemia, unspecified: Secondary | ICD-10-CM

## 2019-06-22 DIAGNOSIS — M19041 Primary osteoarthritis, right hand: Secondary | ICD-10-CM

## 2019-06-22 DIAGNOSIS — Z8679 Personal history of other diseases of the circulatory system: Secondary | ICD-10-CM

## 2019-06-22 DIAGNOSIS — M1A09X0 Idiopathic chronic gout, multiple sites, without tophus (tophi): Secondary | ICD-10-CM | POA: Diagnosis not present

## 2019-06-23 LAB — CBC WITH DIFFERENTIAL/PLATELET
Absolute Monocytes: 666 cells/uL (ref 200–950)
Basophils Absolute: 48 cells/uL (ref 0–200)
Basophils Relative: 0.7 %
Eosinophils Absolute: 666 cells/uL — ABNORMAL HIGH (ref 15–500)
Eosinophils Relative: 9.8 %
HCT: 43.7 % (ref 38.5–50.0)
Hemoglobin: 14.2 g/dL (ref 13.2–17.1)
Lymphs Abs: 1006 cells/uL (ref 850–3900)
MCH: 28.6 pg (ref 27.0–33.0)
MCHC: 32.5 g/dL (ref 32.0–36.0)
MCV: 87.9 fL (ref 80.0–100.0)
MPV: 11.3 fL (ref 7.5–12.5)
Monocytes Relative: 9.8 %
Neutro Abs: 4413 cells/uL (ref 1500–7800)
Neutrophils Relative %: 64.9 %
Platelets: 198 10*3/uL (ref 140–400)
RBC: 4.97 10*6/uL (ref 4.20–5.80)
RDW: 13.4 % (ref 11.0–15.0)
Total Lymphocyte: 14.8 %
WBC: 6.8 10*3/uL (ref 3.8–10.8)

## 2019-06-23 LAB — COMPLETE METABOLIC PANEL WITH GFR
AG Ratio: 2.3 (calc) (ref 1.0–2.5)
ALT: 20 U/L (ref 9–46)
AST: 16 U/L (ref 10–35)
Albumin: 4.3 g/dL (ref 3.6–5.1)
Alkaline phosphatase (APISO): 77 U/L (ref 35–144)
BUN: 17 mg/dL (ref 7–25)
CO2: 25 mmol/L (ref 20–32)
Calcium: 9.5 mg/dL (ref 8.6–10.3)
Chloride: 106 mmol/L (ref 98–110)
Creat: 1.23 mg/dL (ref 0.70–1.25)
GFR, Est African American: 71 mL/min/{1.73_m2} (ref >=60)
GFR, Est Non African American: 61 mL/min/{1.73_m2} (ref >=60)
Globulin: 1.9 g/dL (calc) (ref 1.9–3.7)
Glucose, Bld: 116 mg/dL — ABNORMAL HIGH (ref 65–99)
Potassium: 4.3 mmol/L (ref 3.5–5.3)
Sodium: 141 mmol/L (ref 135–146)
Total Bilirubin: 0.6 mg/dL (ref 0.2–1.2)
Total Protein: 6.2 g/dL (ref 6.1–8.1)

## 2019-06-23 LAB — URIC ACID: Uric Acid, Serum: 5.4 mg/dL (ref 4.0–8.0)

## 2019-06-23 NOTE — Progress Notes (Signed)
Labs are stable. Glucose is mildly elevated as the sample was not fasting.

## 2019-07-14 ENCOUNTER — Other Ambulatory Visit: Payer: Self-pay | Admitting: Rheumatology

## 2019-07-14 NOTE — Telephone Encounter (Signed)
Last Visit: 06/22/2019  Next Visit: 12/21/2019 Labs: 06/22/2019 Labs are stable. Glucose is mildly elevated as the sample was not fasting.  Current Dose per office note on 06/22/2019: Allopurinol 300 mg 1 tablet po daily   Okay to refill per Dr. Estanislado Pandy

## 2019-08-06 DIAGNOSIS — M109 Gout, unspecified: Secondary | ICD-10-CM | POA: Diagnosis not present

## 2019-08-06 DIAGNOSIS — I10 Essential (primary) hypertension: Secondary | ICD-10-CM | POA: Diagnosis not present

## 2019-08-06 DIAGNOSIS — G2581 Restless legs syndrome: Secondary | ICD-10-CM | POA: Diagnosis not present

## 2019-08-06 DIAGNOSIS — I25118 Atherosclerotic heart disease of native coronary artery with other forms of angina pectoris: Secondary | ICD-10-CM | POA: Diagnosis not present

## 2019-08-24 DIAGNOSIS — F419 Anxiety disorder, unspecified: Secondary | ICD-10-CM | POA: Diagnosis not present

## 2019-08-24 DIAGNOSIS — L299 Pruritus, unspecified: Secondary | ICD-10-CM | POA: Diagnosis not present

## 2019-08-24 DIAGNOSIS — Z6830 Body mass index (BMI) 30.0-30.9, adult: Secondary | ICD-10-CM | POA: Diagnosis not present

## 2019-08-24 DIAGNOSIS — L559 Sunburn, unspecified: Secondary | ICD-10-CM | POA: Diagnosis not present

## 2019-08-24 DIAGNOSIS — E6609 Other obesity due to excess calories: Secondary | ICD-10-CM | POA: Diagnosis not present

## 2019-10-07 DIAGNOSIS — F419 Anxiety disorder, unspecified: Secondary | ICD-10-CM | POA: Diagnosis not present

## 2019-10-07 DIAGNOSIS — I2510 Atherosclerotic heart disease of native coronary artery without angina pectoris: Secondary | ICD-10-CM | POA: Diagnosis not present

## 2019-10-07 DIAGNOSIS — E785 Hyperlipidemia, unspecified: Secondary | ICD-10-CM | POA: Diagnosis not present

## 2019-10-07 DIAGNOSIS — G2581 Restless legs syndrome: Secondary | ICD-10-CM | POA: Diagnosis not present

## 2019-10-12 DIAGNOSIS — Z23 Encounter for immunization: Secondary | ICD-10-CM | POA: Diagnosis not present

## 2019-10-12 DIAGNOSIS — Z6830 Body mass index (BMI) 30.0-30.9, adult: Secondary | ICD-10-CM | POA: Diagnosis not present

## 2019-10-12 DIAGNOSIS — L298 Other pruritus: Secondary | ICD-10-CM | POA: Diagnosis not present

## 2019-10-12 DIAGNOSIS — R21 Rash and other nonspecific skin eruption: Secondary | ICD-10-CM | POA: Diagnosis not present

## 2019-11-06 DIAGNOSIS — E7849 Other hyperlipidemia: Secondary | ICD-10-CM | POA: Diagnosis not present

## 2019-11-06 DIAGNOSIS — F419 Anxiety disorder, unspecified: Secondary | ICD-10-CM | POA: Diagnosis not present

## 2019-11-06 DIAGNOSIS — G2581 Restless legs syndrome: Secondary | ICD-10-CM | POA: Diagnosis not present

## 2019-11-06 DIAGNOSIS — I2510 Atherosclerotic heart disease of native coronary artery without angina pectoris: Secondary | ICD-10-CM | POA: Diagnosis not present

## 2019-12-08 NOTE — Progress Notes (Deleted)
Office Visit Note  Patient: Peter Burch             Date of Birth: 1955-06-22           MRN: [id number]             PCP: Sharilyn Sites, MD Referring: Sharilyn Sites, MD Visit Date: 12/21/2019 Occupation: @GUAROCC @  Subjective:  No chief complaint on file.   History of Present Illness: Peter Burch is a 64 y.o. male ***   Activities of Daily Living:  Patient reports morning stiffness for *** {minute/hour:19697}.   Patient {ACTIONS;DENIES/REPORTS:21021675::"Denies"} nocturnal pain.  Difficulty dressing/grooming: {ACTIONS;DENIES/REPORTS:21021675::"Denies"} Difficulty climbing stairs: {ACTIONS;DENIES/REPORTS:21021675::"Denies"} Difficulty getting out of chair: {ACTIONS;DENIES/REPORTS:21021675::"Denies"} Difficulty using hands for taps, buttons, cutlery, and/or writing: {ACTIONS;DENIES/REPORTS:21021675::"Denies"}  No Rheumatology ROS completed.   PMFS History:  Patient Active Problem List   Diagnosis Date Noted  . Chronic pain of right knee 02/07/2016  . History of coronary artery disease 02/05/2016  . Primary osteoarthritis of both hands 02/05/2016  . Renal calcinosis 02/05/2016  . Idiopathic chronic gout of multiple sites without tophus 01/15/2016  . Mixed hyperlipidemia 09/01/2012  . Atherosclerosis of coronary artery of native heart without angina pectoris 09/01/2012    Past Medical History:  Diagnosis Date  . CAD (coronary artery disease)   . Gout   . Hypercholesteremia   . Sinus bradycardia     Family History  Problem Relation Age of Onset  . Heart attack Mother   . Cancer Father        lung   Past Surgical History:  Procedure Laterality Date  . CARDIAC CATHETERIZATION  05/16/2006   40% LAD lesion  . COLONOSCOPY N/A 01/19/2015   Procedure: COLONOSCOPY;  Surgeon: Rogene Houston, MD;  [facility];  Service: Endoscopy;  Laterality: N/A;  1030  . NM MYOCAR PERF WALL MOTION  07/31/2009   mod to severe defect due to infarct/scar w/mild perinfarct  ischemia  . US ECHOCARDIOGRAPHY  07/31/2009   impaired LV relaxation   Social History   Social History Narrative  . Not on file    There is no immunization history on file for this patient.   Objective: Vital Signs: There were no vitals taken for this visit.   Physical Exam   Musculoskeletal Exam: ***  CDAI Exam: CDAI Score: -- Patient Global: --; Provider Global: -- Swollen: --; Tender: -- Joint Exam 12/21/2019   No joint exam has been documented for this visit   There is currently no information documented on the homunculus. Go to the Rheumatology activity and complete the homunculus joint exam.  Investigation: No additional findings.  Imaging: No results found.  Recent Labs: Lab Results  Component Value Date   WBC 6.8 06/22/2019   HGB 14.2 06/22/2019   PLT 198 06/22/2019   NA 141 06/22/2019   K 4.3 06/22/2019   CL 106 06/22/2019   CO2 25 06/22/2019   GLUCOSE 116 (H) 06/22/2019   BUN 17 06/22/2019   CREATININE 1.23 06/22/2019   BILITOT 0.6 06/22/2019   ALKPHOS 70 12/08/2018   AST 16 06/22/2019   ALT 20 06/22/2019   PROT 6.2 06/22/2019   ALBUMIN 4.2 12/08/2018   CALCIUM 9.5 06/22/2019   GFRAA 71 06/22/2019    Speciality Comments: No specialty comments available.  Procedures:  No procedures performed Allergies: Statins   Assessment / Plan:     Visit Diagnoses: No diagnosis found.  Orders: No orders of the defined types were placed in this encounter.  No orders of the defined types were placed in this encounter.   Face-to-face time spent with patient was *** minutes. Greater than 50% of time was spent in counseling and coordination of care.  Follow-Up Instructions: No follow-ups on file.   Ayleen Mckinstry C Blaike Newburn, CMA  Note - This record has been created using Dragon software.  Chart creation errors have been sought, but may not always  have been located. Such creation errors do not reflect on  the standard of medical care. 

## 2019-12-21 ENCOUNTER — Ambulatory Visit: Payer: PPO | Admitting: Physician Assistant

## 2019-12-21 DIAGNOSIS — Z8679 Personal history of other diseases of the circulatory system: Secondary | ICD-10-CM

## 2019-12-21 DIAGNOSIS — M19041 Primary osteoarthritis, right hand: Secondary | ICD-10-CM

## 2019-12-21 DIAGNOSIS — E785 Hyperlipidemia, unspecified: Secondary | ICD-10-CM

## 2019-12-21 DIAGNOSIS — M170 Bilateral primary osteoarthritis of knee: Secondary | ICD-10-CM

## 2019-12-21 DIAGNOSIS — M533 Sacrococcygeal disorders, not elsewhere classified: Secondary | ICD-10-CM

## 2019-12-21 DIAGNOSIS — M1A09X0 Idiopathic chronic gout, multiple sites, without tophus (tophi): Secondary | ICD-10-CM

## 2019-12-21 DIAGNOSIS — Z87442 Personal history of urinary calculi: Secondary | ICD-10-CM

## 2019-12-21 DIAGNOSIS — Z5181 Encounter for therapeutic drug level monitoring: Secondary | ICD-10-CM

## 2019-12-22 ENCOUNTER — Telehealth: Payer: Self-pay | Admitting: Cardiovascular Disease

## 2019-12-22 NOTE — Telephone Encounter (Signed)
Attempted to call pt back. Rang for over 1 minute with no option for VM.

## 2019-12-22 NOTE — Telephone Encounter (Signed)
Per patient schedule message, patient is inquiring about assistance with purchasing Repatha:  "Yes this works fine,  I'm available if he has a cancellation.  My only concern is my Repatha runs out January and your office got me some help with the co-pay.  On this date will the office put me back in contact with his employee?"  Patient has an appointment scheduled for 01/27/19 with Dr. Sallyanne Kuster.

## 2019-12-27 NOTE — Progress Notes (Deleted)
Office Visit Note  Patient: Peter Burch             Date of Birth: [birth year]           MRN: [id number]             PCP: Sharilyn Sites, MD Referring: Sharilyn Sites, MD Visit Date: 01/10/2020 Occupation: @GUAROCC @  Subjective:    History of Present Illness: BLANE WORTHINGTON is a 64 y.o. male with history of gout and osteoarthritis.  He is taking allopurinol 300 mg 1 tablet by mouth daily and colchicine 0.6 mg 1 capsule by mouth daily as needed during flares.   Activities of Daily Living:  Patient reports morning stiffness for   {minute/hour:19697}.   Patient {ACTIONS;DENIES/REPORTS:21021675::"Denies"} nocturnal pain.  Difficulty dressing/grooming: {ACTIONS;DENIES/REPORTS:21021675::"Denies"} Difficulty climbing stairs: {ACTIONS;DENIES/REPORTS:21021675::"Denies"} Difficulty getting out of chair: {ACTIONS;DENIES/REPORTS:21021675::"Denies"} Difficulty using hands for taps, buttons, cutlery, and/or writing: {ACTIONS;DENIES/REPORTS:21021675::"Denies"}  No Rheumatology ROS completed.   PMFS History:  Patient Active Problem List   Diagnosis Date Noted  . Chronic pain of right knee 02/07/2016  . History of coronary artery disease 02/05/2016  . Primary osteoarthritis of both hands 02/05/2016  . Renal calcinosis 02/05/2016  . Idiopathic chronic gout of multiple sites without tophus 01/15/2016  . Mixed hyperlipidemia 09/01/2012  . Atherosclerosis of coronary artery of native heart without angina pectoris 09/01/2012    Past Medical History:  Diagnosis Date  . CAD (coronary artery disease)   . Gout   . Hypercholesteremia   . Sinus bradycardia     Family History  Problem Relation Age of Onset  . Heart attack Mother   . Cancer Father        lung   Past Surgical History:  Procedure Laterality Date  . CARDIAC CATHETERIZATION  05/16/2006   40% LAD lesion  . COLONOSCOPY N/A 01/19/2015   Procedure: COLONOSCOPY;  Surgeon: Rogene Houston, MD;  [facility];  Service:  Endoscopy;  Laterality: N/A;  1030  . NM MYOCAR PERF WALL MOTION  07/31/2009   mod to severe defect due to infarct/scar w/mild perinfarct ischemia  . US ECHOCARDIOGRAPHY  07/31/2009   impaired LV relaxation   Social History   Social History Narrative  . Not on file    There is no immunization history on file for this patient.   Objective: Vital Signs: There were no vitals taken for this visit.   Physical Exam   Musculoskeletal Exam: ***  CDAI Exam: CDAI Score: -- Patient Global: --; Provider Global: -- Swollen: --; Tender: -- Joint Exam 01/10/2020   No joint exam has been documented for this visit   There is currently no information documented on the homunculus. Go to the Rheumatology activity and complete the homunculus joint exam.  Investigation: No additional findings.  Imaging: No results found.  Recent Labs: Lab Results  Component Value Date   WBC 6.8 06/22/2019   HGB 14.2 06/22/2019   PLT 198 06/22/2019   NA 141 06/22/2019   K 4.3 06/22/2019   CL 106 06/22/2019   CO2 25 06/22/2019   GLUCOSE 116 (H) 06/22/2019   BUN 17 06/22/2019   CREATININE 1.23 06/22/2019   BILITOT 0.6 06/22/2019   ALKPHOS 70 12/08/2018   AST 16 06/22/2019   ALT 20 06/22/2019   PROT 6.2 06/22/2019   ALBUMIN 4.2 12/08/2018   CALCIUM 9.5 06/22/2019   GFRAA 71 06/22/2019    Speciality Comments: No specialty comments available.  Procedures:  No procedures performed Allergies: Statins  Assessment / Plan:     Visit Diagnoses: Idiopathic chronic gout of multiple sites without tophus  Medication monitoring encounter  Primary osteoarthritis of both hands  Primary osteoarthritis of both knees  Coccygodynia  History of renal stone  History of coronary artery disease  Dyslipidemia  Orders: No orders of the defined types were placed in this encounter.  No orders of the defined types were placed in this encounter.   Face-to-face time spent with patient was *** minutes.  Greater than 50% of time was spent in counseling and coordination of care.  Follow-Up Instructions: No follow-ups on file.   Ofilia Neas, PA-C  Note - This record has been created using Dragon software.  Chart creation errors have been sought, but may not always  have been located. Such creation errors do not reflect on  the standard of medical care.

## 2019-12-28 NOTE — Telephone Encounter (Signed)
Unable to reach patient.   Encounter closed.

## 2019-12-28 NOTE — Telephone Encounter (Signed)
2nd attempt to call pt. Left voicemail.

## 2020-01-06 ENCOUNTER — Other Ambulatory Visit: Payer: Self-pay | Admitting: Cardiovascular Disease

## 2020-01-06 DIAGNOSIS — E782 Mixed hyperlipidemia: Secondary | ICD-10-CM

## 2020-01-10 ENCOUNTER — Ambulatory Visit: Payer: PPO | Admitting: Physician Assistant

## 2020-01-10 ENCOUNTER — Encounter: Payer: Self-pay | Admitting: Rheumatology

## 2020-01-10 DIAGNOSIS — E782 Mixed hyperlipidemia: Secondary | ICD-10-CM

## 2020-01-10 NOTE — Telephone Encounter (Signed)
Appointment cancelled and rescheduled.

## 2020-01-10 NOTE — Progress Notes (Signed)
Office Visit Note  Patient: Peter Burch             Date of Birth: [birth year]           MRN: [id number]             PCP: Sharilyn Sites, MD Referring: Sharilyn Sites, MD Visit Date: 01/19/2020 Occupation: @GUAROCC @  Subjective:  Discuss restarting allopurinol   History of Present Illness: Peter Burch is a 65 y.o. male with history of gout and osteoarthritis. Patient denies any recent gout flares. He states that over the past 1 month he has not been taking allopurinol due to forgetting to take it over the holidays. He has not noticed any increased joint pain or joint swelling off of allopurinol. He states that he has not had any alcohol since September 2021. He states he is also not needed to take colchicine since May 2021. He denies any increased joint pain or joint swelling at this time. He has not had any discomfort in his knees recently.   Activities of Daily Living:  Patient reports morning stiffness for   none.   Patient Denies nocturnal pain.  Difficulty dressing/grooming: Denies Difficulty climbing stairs: Denies Difficulty getting out of chair: Reports Difficulty using hands for taps, buttons, cutlery, and/or writing: Denies  Review of Systems  Constitutional: Negative for fatigue.  HENT: Negative for mouth dryness.   Eyes: Negative for redness.  Respiratory: Negative for shortness of breath.   Cardiovascular: Negative for swelling in legs/feet.  Gastrointestinal: Negative for constipation.  Endocrine: Negative for increased urination.  Genitourinary: Negative for painful urination.  Musculoskeletal: Negative for joint swelling.  Skin: Negative for rash.  Allergic/Immunologic: Negative for susceptible to infections.  Neurological: Negative for weakness.  Hematological: Negative for bruising/bleeding tendency.  Psychiatric/Behavioral: Negative for sleep disturbance.    PMFS History:  Patient Active Problem List   Diagnosis Date Noted  . Chronic pain of right  knee 02/07/2016  . History of coronary artery disease 02/05/2016  . Primary osteoarthritis of both hands 02/05/2016  . Renal calcinosis 02/05/2016  . Idiopathic chronic gout of multiple sites without tophus 01/15/2016  . Mixed hyperlipidemia 09/01/2012  . Atherosclerosis of coronary artery of native heart without angina pectoris 09/01/2012    Past Medical History:  Diagnosis Date  . CAD (coronary artery disease)   . Gout   . Hypercholesteremia   . Sinus bradycardia     Family History  Problem Relation Age of Onset  . Heart attack Mother   . Cancer Father        lung   Past Surgical History:  Procedure Laterality Date  . CARDIAC CATHETERIZATION  05/16/2006   40% LAD lesion  . COLONOSCOPY N/A 01/19/2015   Procedure: COLONOSCOPY;  Surgeon: Rogene Houston, MD;  Location: [facility];  Service: Endoscopy;  Laterality: N/A;  1030  . NM MYOCAR PERF WALL MOTION  07/31/2009   mod to severe defect due to infarct/scar w/mild perinfarct ischemia  . US ECHOCARDIOGRAPHY  07/31/2009   impaired LV relaxation   Social History   Social History Narrative  . Not on file   Immunization History  Administered Date(s) Administered  . Moderna Sars-Covid-2 Vaccination 01/12/2019, 02/09/2019, 11/11/2019     Objective: Vital Signs: BP 121/71 (BP Location: Left Arm, Patient Position: Sitting, Cuff Size: Normal)   Pulse (!) 102   Resp 16   Ht 6\' 3"  (1.905 m)   Wt 237 lb (107.5 kg)   BMI 29.62 kg/m  Physical Exam Vitals and nursing note reviewed.  Constitutional:      Appearance: He is well-developed and well-nourished.  HENT:     Head: Normocephalic and atraumatic.  Eyes:     Extraocular Movements: EOM normal.     Conjunctiva/sclera: Conjunctivae normal.     Pupils: Pupils are equal, round, and reactive to light.  Pulmonary:     Effort: Pulmonary effort is normal.  Abdominal:     Palpations: Abdomen is soft.  Musculoskeletal:     Cervical back: Normal range of motion and neck  supple.  Skin:    General: Skin is warm and dry.     Capillary Refill: Capillary refill takes less than 2 seconds.  Neurological:     Mental Status: He is alert and oriented to person, place, and time.  Psychiatric:        Mood and Affect: Mood and affect normal.        Behavior: Behavior normal.      Musculoskeletal Exam: C-spine, thoracic spine, and lumbar spine good ROM with no discomfort.  No midline spinal tenderness or SI joint tenderness. Shoulder joints, elbow joints, wrist joints, MCPs, PIPs, DIPs have good range of motion with no synovitis. He was able to make a complete fist bilaterally. He has PIP and DIP thickening consistent with osteoarthritis of both hands. Knee joints have good range of motion with no warmth or effusion. Ankle joints have good range of motion with no tenderness or inflammation.  CDAI Exam: CDAI Score: -- Patient Global: --; Provider Global: -- Swollen: --; Tender: -- Joint Exam 01/19/2020   No joint exam has been documented for this visit   There is currently no information documented on the homunculus. Go to the Rheumatology activity and complete the homunculus joint exam.  Investigation: No additional findings.  Imaging: No results found.  Recent Labs: Lab Results  Component Value Date   WBC 6.8 06/22/2019   HGB 14.2 06/22/2019   PLT 198 06/22/2019   NA 141 06/22/2019   K 4.3 06/22/2019   CL 106 06/22/2019   CO2 25 06/22/2019   GLUCOSE 116 (H) 06/22/2019   BUN 17 06/22/2019   CREATININE 1.23 06/22/2019   BILITOT 0.6 06/22/2019   ALKPHOS 70 12/08/2018   AST 16 06/22/2019   ALT 20 06/22/2019   PROT 6.2 06/22/2019   ALBUMIN 4.2 12/08/2018   CALCIUM 9.5 06/22/2019   GFRAA 71 06/22/2019    Speciality Comments: No specialty comments available.  Procedures:  No procedures performed Allergies: Statins   Assessment / Plan:     Visit Diagnoses: Idiopathic chronic gout of multiple sites without tophus -He has not had any signs or  symptoms of a gout flare. He is prescribed allopurinol 300 mg 1 tablet by mouth daily and colchicine 0.6 mg 1 capsule by mouth daily as needed. According to the patient he has not taken allopurinol in about 1 month due to forgetting to take the prescription but he plans to resume. A refill of allopurinol was sent to the pharmacy today. He has not needed to take colchicine since May 2021 while on vacation. He is not having any joint pain or swelling at this time. No synovitis or joint effusions were noted on examination today. His uric acid was 5.4 on 06/22/2019. He is due to update lab work and plans to have his lab work drawn tomorrow. Order for uric acid level was placed today. He will also be having CMP and lipid panel drawn while he  is fasting. He was advised to notify us if he develops signs or symptoms of a gout flare. He will follow-up in the office in 5 to 6 months.- Plan: Uric acid  Medication monitoring encounter - Allopurinol 300 mg 1 tablet by mouth daily and Colchicine 0.6 mg 1 capsule by mouth daily as needed.  Uric acid was 5.4 on 06/22/19.  Due to update lab work.  Orders for uric acid, CBC, and CMP are in place. - Plan: Uric acid  Primary osteoarthritis of both hands: He has PIP and DIP thickening consistent with osteoarthritis of both hands. No joint tenderness or inflammation was noted. He is able to make a complete fist bilaterally. We discussed the importance of joint protection and muscle strengthening. He was encouraged to perform hand exercises on a regular basis.   Primary osteoarthritis of both knees:  He is not having any discomfort in his knee joints at this time.  He has good ROM of both knees with no warmth or effusion. Discussed the importance of lower extremity muscle strengthening.  Coccygodynia - He has occasional discomfort when sitting on hard surfaces.  History of renal stone: x1 with no recent recurrence.  Dyslipidemia: He is on Repatha. He has a follow-up appointment  with his cardiologist next week. Order for lipid panel is in place. He plans on having lab work drawn tomorrow morning while he is fasting.  History of coronary artery disease: Followed by cardiologist.  Orders: Orders Placed This Encounter  Procedures  . Uric acid   Meds ordered this encounter  Medications  . allopurinol (ZYLOPRIM) 300 MG tablet    Sig: TAKE (1) TABLET BY MOUTH ONCE DAILY.    Dispense:  90 tablet    Refill:  0     Follow-Up Instructions: Return in about 6 months (around 07/18/2020) for Gout.   Gearldine Bienenstock, PA-C  Note - This record has been created using Dragon software.  Chart creation errors have been sought, but may not always  have been located. Such creation errors do not reflect on  the standard of medical care.

## 2020-01-19 ENCOUNTER — Other Ambulatory Visit: Payer: Self-pay

## 2020-01-19 ENCOUNTER — Encounter: Payer: Self-pay | Admitting: Physician Assistant

## 2020-01-19 ENCOUNTER — Telehealth: Payer: Self-pay

## 2020-01-19 ENCOUNTER — Ambulatory Visit: Payer: PPO | Admitting: Physician Assistant

## 2020-01-19 VITALS — BP 121/71 | HR 102 | Resp 16 | Ht 75.0 in | Wt 237.0 lb

## 2020-01-19 DIAGNOSIS — M170 Bilateral primary osteoarthritis of knee: Secondary | ICD-10-CM | POA: Diagnosis not present

## 2020-01-19 DIAGNOSIS — M533 Sacrococcygeal disorders, not elsewhere classified: Secondary | ICD-10-CM | POA: Diagnosis not present

## 2020-01-19 DIAGNOSIS — Z87442 Personal history of urinary calculi: Secondary | ICD-10-CM | POA: Diagnosis not present

## 2020-01-19 DIAGNOSIS — Z5181 Encounter for therapeutic drug level monitoring: Secondary | ICD-10-CM | POA: Diagnosis not present

## 2020-01-19 DIAGNOSIS — M1A09X0 Idiopathic chronic gout, multiple sites, without tophus (tophi): Secondary | ICD-10-CM | POA: Diagnosis not present

## 2020-01-19 DIAGNOSIS — M19041 Primary osteoarthritis, right hand: Secondary | ICD-10-CM

## 2020-01-19 DIAGNOSIS — E785 Hyperlipidemia, unspecified: Secondary | ICD-10-CM

## 2020-01-19 DIAGNOSIS — M19042 Primary osteoarthritis, left hand: Secondary | ICD-10-CM | POA: Diagnosis not present

## 2020-01-19 DIAGNOSIS — Z8639 Personal history of other endocrine, nutritional and metabolic disease: Secondary | ICD-10-CM

## 2020-01-19 DIAGNOSIS — Z8679 Personal history of other diseases of the circulatory system: Secondary | ICD-10-CM

## 2020-01-19 MED ORDER — ALLOPURINOL 300 MG PO TABS: ORAL_TABLET | ORAL | 0 refills | Status: DC

## 2020-01-19 NOTE — Telephone Encounter (Signed)
Lab orders released for [facility], per Hazel Sams, PA-C. Patient will be going tomorrow for lab draw.

## 2020-01-20 DIAGNOSIS — E785 Hyperlipidemia, unspecified: Secondary | ICD-10-CM | POA: Diagnosis not present

## 2020-01-20 DIAGNOSIS — E782 Mixed hyperlipidemia: Secondary | ICD-10-CM | POA: Diagnosis not present

## 2020-01-20 DIAGNOSIS — M1A09X0 Idiopathic chronic gout, multiple sites, without tophus (tophi): Secondary | ICD-10-CM | POA: Diagnosis not present

## 2020-01-20 DIAGNOSIS — Z5181 Encounter for therapeutic drug level monitoring: Secondary | ICD-10-CM | POA: Diagnosis not present

## 2020-01-21 LAB — URIC ACID: Uric Acid: 8.1 mg/dL (ref 3.8–8.4)

## 2020-01-21 LAB — COMPREHENSIVE METABOLIC PANEL
ALT: 19 IU/L (ref 0–44)
AST: 17 IU/L (ref 0–40)
Albumin/Globulin Ratio: 2.4 — ABNORMAL HIGH (ref 1.2–2.2)
Albumin: 4.6 g/dL (ref 3.8–4.8)
Alkaline Phosphatase: 95 IU/L (ref 44–121)
BUN/Creatinine Ratio: 10 (ref 10–24)
BUN: 12 mg/dL (ref 8–27)
Bilirubin Total: 0.4 mg/dL (ref 0.0–1.2)
CO2: 25 mmol/L (ref 20–29)
Calcium: 9.4 mg/dL (ref 8.6–10.2)
Chloride: 104 mmol/L (ref 96–106)
Creatinine, Ser: 1.25 mg/dL (ref 0.76–1.27)
GFR calc Af Amer: 69 mL/min/{1.73_m2} (ref >59)
GFR calc non Af Amer: 60 mL/min/{1.73_m2} (ref >59)
Globulin, Total: 1.9 g/dL (ref 1.5–4.5)
Glucose: 98 mg/dL (ref 65–99)
Potassium: 4.8 mmol/L (ref 3.5–5.2)
Sodium: 142 mmol/L (ref 134–144)
Total Protein: 6.5 g/dL (ref 6.0–8.5)

## 2020-01-21 LAB — LIPID PANEL
Chol/HDL Ratio: 3.5 ratio (ref 0.0–5.0)
Cholesterol, Total: 159 mg/dL (ref 100–199)
HDL: 46 mg/dL (ref >39)
LDL Chol Calc (NIH): 93 mg/dL (ref 0–99)
Triglycerides: 113 mg/dL (ref 0–149)
VLDL Cholesterol Cal: 20 mg/dL (ref 5–40)

## 2020-01-21 NOTE — Progress Notes (Signed)
Uric acid is elevated-8.1.  Ideally his uric acid should be less than 6. He has not been taking allopurinol for the past 1 month.  Please advise the patient to restart taking allopurinol as prescribed. He should also avoid red meat, shellfish, beer, and food with high fructose corn syrup.

## 2020-01-27 ENCOUNTER — Encounter: Payer: Self-pay | Admitting: Cardiovascular Disease

## 2020-01-27 ENCOUNTER — Other Ambulatory Visit: Payer: Self-pay

## 2020-01-27 ENCOUNTER — Ambulatory Visit: Payer: PPO | Admitting: Cardiovascular Disease

## 2020-01-27 VITALS — BP 110/84 | HR 68 | Ht 75.0 in | Wt 233.0 lb

## 2020-01-27 DIAGNOSIS — I2510 Atherosclerotic heart disease of native coronary artery without angina pectoris: Secondary | ICD-10-CM

## 2020-01-27 DIAGNOSIS — E782 Mixed hyperlipidemia: Secondary | ICD-10-CM

## 2020-01-27 DIAGNOSIS — R7303 Prediabetes: Secondary | ICD-10-CM | POA: Diagnosis not present

## 2020-01-27 DIAGNOSIS — E663 Overweight: Secondary | ICD-10-CM | POA: Diagnosis not present

## 2020-01-27 MED ORDER — EZETIMIBE 10 MG PO TABS: 10.0000 mg | ORAL_TABLET | Freq: Every day | ORAL | 3 refills | Status: DC

## 2020-01-27 NOTE — Progress Notes (Signed)
Cardiology Office Note    Date:  01/27/2020   ID:  Peter Burch, DOB 10/20/1955, MRN [id number]  PCP:  Sharilyn Sites, MD  Cardiologist:   Sanda Klein, MD   Chief complaint: CAD   History of Present Illness:  Peter Burch is a 65 y.o. male with known mild coronary artery disease (40% proximal LAD calcified lesion by cath 2008), without symptoms of angina pectoris and with a low risk nuclear stress test in 2011. He is known to have an inferior wall defect on nuclear stress test that is a false positive abnormality (this actually led to the heart cath in 2008). He has hyperlipidemia but has been unable to tolerate numerous statins. He did take Zetia without side effects, but this was ineffective so he is currently receiving Repatha.  He had some atypical chest pressure complaints about a year ago and underwent a coronary CT angiogram in January 2021.  The calcium score was high at 1351 (95th percentile), there was a moderate proximal LAD stenosis estimated at about 50%, there were no hemodynamically significant lesions by FFR.    Chest pressure has no longer been an issue.  Throughout the months of October and November he was walking a hilly course of 2 miles on a daily basis, without cardiac complaints.  He felt that his energy and stamina was building up, but he had to stop in December when he developed a severe "chest cold" with incessant sinus drainage and coughing.  Tested twice negative for COVID.  Symptoms persisted for as much as a month but have finally resolved.  His endurance has diminished, but he is planning to start walking again.    Discouraged by weight gain during the holidays and period of inactivity, but has managed to lose 6 pounds in the last couple of weeks again.  His most recent LDL cholesterol just a few days ago was 93.  Other lipid parameters were within normal range.  The patient specifically denies any chest pain at rest exertion, dyspnea at rest or with  exertion, orthopnea, paroxysmal nocturnal dyspnea, syncope, palpitations, focal neurological deficits, intermittent claudication, lower extremity edema, unexplained weight gain, cough, hemoptysis or wheezing.   Past Medical History:  Diagnosis Date  . CAD (coronary artery disease)   . Gout   . Hypercholesteremia   . Sinus bradycardia     Past Surgical History:  Procedure Laterality Date  . CARDIAC CATHETERIZATION  05/16/2006   40% LAD lesion  . COLONOSCOPY N/A 01/19/2015   Procedure: COLONOSCOPY;  Surgeon: Rogene Houston, MD;  [facility]: [facility];  Service: Endoscopy;  Laterality: N/A;  1030  . NM MYOCAR PERF WALL MOTION  07/31/2009   mod to severe defect due to infarct/scar w/mild perinfarct ischemia  . US ECHOCARDIOGRAPHY  07/31/2009   impaired LV relaxation    Current Medications: Outpatient Medications Prior to Visit  Medication Sig Dispense Refill  . allopurinol (ZYLOPRIM) 300 MG tablet TAKE (1) TABLET BY MOUTH ONCE DAILY. 90 tablet 0  . ALPRAZolam (XANAX) 0.5 MG tablet Take 0.25-0.5 tablets by mouth daily as needed for anxiety.     Marland Kitchen esomeprazole (NEXIUM) 20 MG capsule Take 20 mg by mouth daily as needed (for GERD).    . fluticasone (FLONASE) 50 MCG/ACT nasal spray Place 1 spray into the nose as needed for allergies.     Marland Kitchen MITIGARE 0.6 MG CAPS Take 0.6 mg by mouth daily. (Patient taking differently: Take 0.6 mg by mouth daily as needed (for  pain).) 30 capsule 1  . REPATHA SURECLICK 268 MG/ML SOAJ INJECT 140MG  INTO THE SKIN EVERY 14 DAYS. 2 mL 5   No facility-administered medications prior to visit.     Allergies:   Statins   Social History   Socioeconomic History  . Marital status: Married    Spouse name: Not on file  . Number of children: Not on file  . Years of education: Not on file  . Highest education level: Not on file  Occupational History  . Not on file  Tobacco Use  . Smoking status: Never Smoker  . Smokeless tobacco: Never Used  Vaping Use  . Vaping  Use: Never used  Substance and Sexual Activity  . Alcohol use: Yes    Comment: 1 MONTHLY  . Drug use: No  . Sexual activity: Not on file  Other Topics Concern  . Not on file  Social History Narrative  . Not on file   Social Determinants of Health   Financial Resource Strain: Not on file  Food Insecurity: Not on file  Transportation Needs: Not on file  Physical Activity: Not on file  Stress: Not on file  Social Connections: Not on file     Family History:  The patient's family history includes Cancer in his father; Heart attack in his mother.   ROS:   Please see the history of present illness.    ROS All other systems are reviewed and are negative.   PHYSICAL EXAM:   VS:  BP 110/84 (BP Location: Left Arm, Patient Position: Sitting, Cuff Size: Large)   Pulse 68   Ht 6\' 3"  (1.905 m)   Wt 233 lb (105.7 kg)   BMI 29.12 kg/m      General: Alert, oriented x3, no distress, overweight Head: no evidence of trauma, PERRL, EOMI, no exophtalmos or lid lag, no myxedema, no xanthelasma; normal ears, nose and oropharynx Neck: normal jugular venous pulsations and no hepatojugular reflux; brisk carotid pulses without delay and no carotid bruits Chest: clear to auscultation, no signs of consolidation by percussion or palpation, normal fremitus, symmetrical and full respiratory excursions Cardiovascular: normal position and quality of the apical impulse, regular rhythm, normal first and second heart sounds, no murmurs, rubs or gallops Abdomen: no tenderness or distention, no masses by palpation, no abnormal pulsatility or arterial bruits, normal bowel sounds, no hepatosplenomegaly Extremities: no clubbing, cyanosis or edema; 2+ radial, ulnar and brachial pulses bilaterally; 2+ right femoral, posterior tibial and dorsalis pedis pulses; 2+ left femoral, posterior tibial and dorsalis pedis pulses; no subclavian or femoral bruits Neurological: grossly nonfocal Psych: Normal mood and affect  Wt  Readings from Last 3 Encounters:  01/27/20 233 lb (105.7 kg)  01/19/20 237 lb (107.5 kg)  06/22/19 240 lb 3.2 oz (109 kg)      Studies/Labs Reviewed:   EKG:  EKG is ordered today.  It shows normal sinus rhythm, completely normal tracing  Recent Labs: 06/22/2019: Hemoglobin 14.2; Platelets 198 01/20/2020: ALT 19; BUN 12; Creatinine, Ser 1.25; Potassium 4.8; Sodium 142   Lipid Panel    Component Value Date/Time   CHOL 159 01/20/2020 0929   TRIG 113 01/20/2020 0929   HDL 46 01/20/2020 0929   CHOLHDL 3.5 01/20/2020 0929   CHOLHDL 4.8 01/20/2019 1128   VLDL 36 01/20/2019 1128   LDLCALC 93 01/20/2020 0929      ASSESSMENT:    1. Atherosclerosis of native coronary artery of native heart without angina pectoris   2. Mixed hyperlipidemia  3. Prediabetes   4. Overweight      PLAN:  In order of problems listed above:  1. CAD: Asymptomatic.  History of false positive nuclear stress test in the past.  Coronary CTA shows high plaque burden and a proximal LAD moderate lesion, but no flow-limiting stenoses.  Encouraged him to go back to regular physical exercise.  Focus on risk factor modification.. 2. HLP: LDL was around 160 off therapy and 142 on Zetia monotherapy.  Now 93 on Repatha, even though it was as low as 49 a year ago.  She tolerated Zetia without side effects and will restart this.  Recheck lipids in 3 months.  Encouraged him to go back to regular physical exercise. 3. PreDM: Improved glycemic control with weight loss.  Hemoglobin A1c a few days ago 5.6%. 4. Overweight: Had a setback with his respiratory problems last month, but is losing weight again.  Congratulated him on his perseverance.   Medication Adjustments/Labs and Tests Ordered: Current medicines are reviewed at length with the patient today.  Concerns regarding medicines are outlined above.  Medication changes, Labs and Tests ordered today are listed in the Patient Instructions below. Patient Instructions   Medication Instructions:  START Ezetimibe (Zetia) 10 mg once daily  *If you need a refill on your cardiac medications before your next appointment, please call your pharmacy*   Lab Work: Your provider would like for you to return in 3 months to have the following labs drawn: Fasting Lipid. You do not need an appointment for the lab. Once in our office lobby there is a podium where you can sign in and ring the doorbell to alert [place] that you are here. The lab is open from 8:00 am to 4:30 pm; closed for lunch from 12:45pm-1:45pm.  If you have labs (blood work) drawn today and your tests are completely normal, you will receive your results only by: Marland Kitchen MyChart Message (if you have MyChart) OR . A paper copy in the mail If you have any lab test that is abnormal or we need to change your treatment, we will call you to review the results.   Testing/Procedures: None ordered   Follow-Up: At [facility], you and your health needs are our priority.  As part of our continuing mission to provide you with exceptional heart care, we have created designated Provider Care Teams.  These Care Teams include your primary Cardiologist (physician) and Advanced Practice Providers (APPs -  Physician Assistants and Nurse Practitioners) who all work together to provide you with the care you need, when you need it.  We recommend signing up for the patient portal called "MyChart".  Sign up information is provided on this After Visit Summary.  MyChart is used to connect with patients for Virtual Visits (Telemedicine).  Patients are able to view lab/test results, encounter notes, upcoming appointments, etc.  Non-urgent messages can be sent to your provider as well.   To learn more about what you can do with MyChart, go to [email or web address].    Your next appointment:   12 month(s)  The format for your next appointment:   In Person  Provider:   You may see Sanda Klein, MD or one of the following Advanced  Practice Providers on your designated Care Team:    Almyra Deforest, PA-C  Fabian Sharp, [place] or   Roby Lofts, PA-C       Signed, Sanda Klein, MD  01/27/2020 9:06 AM    [facility] [id number]  [address], [place], [place]  [address] Phone: [phone]; Fax: [phone]

## 2020-01-27 NOTE — Patient Instructions (Signed)
Medication Instructions:  START Ezetimibe (Zetia) 10 mg once daily  *If you need a refill on your cardiac medications before your next appointment, please call your pharmacy*   Lab Work: Your provider would like for you to return in 3 months to have the following labs drawn: Fasting Lipid. You do not need an appointment for the lab. Once in our office lobby there is a podium where you can sign in and ring the doorbell to alert us that you are here. The lab is open from 8:00 am to 4:30 pm; closed for lunch from 12:45pm-1:45pm.  If you have labs (blood work) drawn today and your tests are completely normal, you will receive your results only by: . MyChart Message (if you have MyChart) OR . A paper copy in the mail If you have any lab test that is abnormal or we need to change your treatment, we will call you to review the results.   Testing/Procedures: None ordered   Follow-Up: At [facility], you and your health needs are our priority.  As part of our continuing mission to provide you with exceptional heart care, we have created designated Provider Care Teams.  These Care Teams include your primary Cardiologist (physician) and Advanced Practice Providers (APPs -  Physician Assistants and Nurse Practitioners) who all work together to provide you with the care you need, when you need it.  We recommend signing up for the patient portal called "MyChart".  Sign up information is provided on this After Visit Summary.  MyChart is used to connect with patients for Virtual Visits (Telemedicine).  Patients are able to view lab/test results, encounter notes, upcoming appointments, etc.  Non-urgent messages can be sent to your provider as well.   To learn more about what you can do with MyChart, go to [email or web address].    Your next appointment:   12 month(s)  The format for your next appointment:   In Person  Provider:   You may see Mihai Croitoru, MD or one of the following Advanced  Practice Providers on your designated Care Team:    Hao Meng, PA-C  Angela Duke, PA-C or   Krista Kroeger, PA-C    

## 2020-01-31 DIAGNOSIS — Z1389 Encounter for screening for other disorder: Secondary | ICD-10-CM | POA: Diagnosis not present

## 2020-01-31 DIAGNOSIS — Z1331 Encounter for screening for depression: Secondary | ICD-10-CM | POA: Diagnosis not present

## 2020-01-31 DIAGNOSIS — F419 Anxiety disorder, unspecified: Secondary | ICD-10-CM | POA: Diagnosis not present

## 2020-01-31 DIAGNOSIS — Z6830 Body mass index (BMI) 30.0-30.9, adult: Secondary | ICD-10-CM | POA: Diagnosis not present

## 2020-01-31 DIAGNOSIS — Z0001 Encounter for general adult medical examination with abnormal findings: Secondary | ICD-10-CM | POA: Diagnosis not present

## 2020-01-31 DIAGNOSIS — I25118 Atherosclerotic heart disease of native coronary artery with other forms of angina pectoris: Secondary | ICD-10-CM | POA: Diagnosis not present

## 2020-01-31 DIAGNOSIS — R7309 Other abnormal glucose: Secondary | ICD-10-CM | POA: Diagnosis not present

## 2020-02-05 DIAGNOSIS — G2581 Restless legs syndrome: Secondary | ICD-10-CM | POA: Diagnosis not present

## 2020-02-05 DIAGNOSIS — I2510 Atherosclerotic heart disease of native coronary artery without angina pectoris: Secondary | ICD-10-CM | POA: Diagnosis not present

## 2020-02-05 DIAGNOSIS — F419 Anxiety disorder, unspecified: Secondary | ICD-10-CM | POA: Diagnosis not present

## 2020-02-05 DIAGNOSIS — E785 Hyperlipidemia, unspecified: Secondary | ICD-10-CM | POA: Diagnosis not present

## 2020-04-28 DIAGNOSIS — E782 Mixed hyperlipidemia: Secondary | ICD-10-CM | POA: Diagnosis not present

## 2020-04-29 LAB — LIPID PANEL
Chol/HDL Ratio: 2.1 ratio (ref 0.0–5.0)
Cholesterol, Total: 101 mg/dL (ref 100–199)
HDL: 47 mg/dL (ref >39)
LDL Chol Calc (NIH): 26 mg/dL (ref 0–99)
Triglycerides: 171 mg/dL — ABNORMAL HIGH (ref 0–149)
VLDL Cholesterol Cal: 28 mg/dL (ref 5–40)

## 2020-05-27 ENCOUNTER — Other Ambulatory Visit: Payer: Self-pay | Admitting: Cardiovascular Disease

## 2020-05-27 ENCOUNTER — Other Ambulatory Visit: Payer: Self-pay | Admitting: Physician Assistant

## 2020-05-27 DIAGNOSIS — E782 Mixed hyperlipidemia: Secondary | ICD-10-CM

## 2020-05-29 NOTE — Telephone Encounter (Signed)
Next Visit: 07/19/2020  Last Visit: 01/19/2020  Last Fill: 01/19/2020   DX: Idiopathic chronic gout of multiple sites without tophus   Current Dose per office note on 01/19/2020: Allopurinol 300 mg 1 tablet by mouth daily   Labs: 01/20/2020 Uric acid is elevated-8.1.   Okay to refill allopurinol?

## 2020-07-05 NOTE — Progress Notes (Signed)
Office Visit Note  Patient: Peter Burch             Date of Birth: Mar 12, 1955           MRN: [id number]             PCP: Sharilyn Sites, MD Referring: Sharilyn Sites, MD Visit Date: 07/19/2020 Occupation: @GUAROCC @  Subjective:  Medication management.   History of Present Illness: Peter Burch is a 65 y.o. male with a history of gout and osteoarthritis.  He states he has not had a gout flare in a long time.  He continues to have some stiffness in his hands especially his bilateral middle finger.  He has been taking allopurinol on a regular basis.  Activities of Daily Living:  Patient reports morning stiffness for 5 minutes.   Patient Denies nocturnal pain.  Difficulty dressing/grooming: Denies Difficulty climbing stairs: Denies Difficulty getting out of chair: Reports Difficulty using hands for taps, buttons, cutlery, and/or writing: Denies  Review of Systems  Constitutional:  Negative for fatigue.  HENT:  Negative for mouth sores, mouth dryness and nose dryness.   Eyes:  Negative for pain, itching and dryness.  Respiratory:  Negative for shortness of breath and difficulty breathing.   Cardiovascular:  Negative for chest pain and palpitations.  Gastrointestinal:  Negative for blood in stool, constipation and diarrhea.  Endocrine: Negative for increased urination.  Genitourinary:  Negative for difficulty urinating.  Musculoskeletal:  Positive for morning stiffness. Negative for joint pain, joint pain, joint swelling, myalgias, muscle tenderness and myalgias.  Skin:  Positive for rash. Negative for color change.  Allergic/Immunologic: Negative for susceptible to infections.  Neurological:  Negative for dizziness, numbness, headaches, memory loss and weakness.  Hematological:  Negative for bruising/bleeding tendency.  Psychiatric/Behavioral:  Negative for confusion.    PMFS History:  Patient Active Problem List   Diagnosis Date Noted   Chronic pain of right knee  02/07/2016   History of coronary artery disease 02/05/2016   Primary osteoarthritis of both hands 02/05/2016   Renal calcinosis 02/05/2016   Idiopathic chronic gout of multiple sites without tophus 01/15/2016   Mixed hyperlipidemia 09/01/2012   Atherosclerosis of coronary artery of native heart without angina pectoris 09/01/2012    Past Medical History:  Diagnosis Date   CAD (coronary artery disease)    Gout    Hypercholesteremia    Sinus bradycardia     Family History  Problem Relation Age of Onset   Heart attack Mother    Cancer Father        lung   Past Surgical History:  Procedure Laterality Date   CARDIAC CATHETERIZATION  05/16/2006   40% LAD lesion   COLONOSCOPY N/A 01/19/2015   Procedure: COLONOSCOPY;  Surgeon: Rogene Houston, MD;  [facility];  Service: Endoscopy;  Laterality: N/A;  [place]  07/31/2009   mod to severe defect due to infarct/scar w/mild perinfarct ischemia   US ECHOCARDIOGRAPHY  07/31/2009   impaired LV relaxation   Social History   Social History Narrative   Not on file   Immunization History  Administered Date(s) Administered   Moderna Sars-Covid-2 Vaccination 01/12/2019, 02/09/2019, 11/11/2019     Objective: Vital Signs: BP 121/78 (BP Location: Right Arm, Patient Position: Sitting, Cuff Size: Normal)   Pulse 76   Ht 6\' 3"  (1.905 m)   Wt 241 lb 12.8 oz (109.7 kg)   BMI 30.22 kg/m    Physical Exam  Vitals and nursing note reviewed.  Constitutional:      Appearance: He is well-developed.  HENT:     Head: Normocephalic and atraumatic.  Eyes:     Conjunctiva/sclera: Conjunctivae normal.     Pupils: Pupils are equal, round, and reactive to light.  Cardiovascular:     Rate and Rhythm: Normal rate and regular rhythm.     Heart sounds: Normal heart sounds.  Pulmonary:     Effort: Pulmonary effort is normal.     Breath sounds: Normal breath sounds.  Abdominal:     General: Bowel sounds are normal.      Palpations: Abdomen is soft.  Musculoskeletal:     Cervical back: Normal range of motion and neck supple.  Skin:    General: Skin is warm and dry.     Capillary Refill: Capillary refill takes less than 2 seconds.  Neurological:     Mental Status: He is alert and oriented to person, place, and time.  Psychiatric:        Behavior: Behavior normal.     Musculoskeletal Exam: C-spine was in good range of motion.  Shoulder joints, elbow joints, wrist joints were in good range of motion.  He had bilateral Dupuytren's contracture.  DIP and PIP thickening was noted in bilateral hands.  Hip joints and knee joints in good range of motion.  There is no tenderness over ankles or MTPs.  CDAI Exam: CDAI Score: -- Patient Global: --; Provider Global: -- Swollen: --; Tender: -- Joint Exam 07/19/2020   No joint exam has been documented for this visit   There is currently no information documented on the homunculus. Go to the Rheumatology activity and complete the homunculus joint exam.  Investigation: No additional findings.  Imaging: No results found.  Recent Labs: Lab Results  Component Value Date   WBC 6.8 06/22/2019   HGB 14.2 06/22/2019   PLT 198 06/22/2019   NA 142 01/20/2020   K 4.8 01/20/2020   CL 104 01/20/2020   CO2 25 01/20/2020   GLUCOSE 98 01/20/2020   BUN 12 01/20/2020   CREATININE 1.25 01/20/2020   BILITOT 0.4 01/20/2020   ALKPHOS 95 01/20/2020   AST 17 01/20/2020   ALT 19 01/20/2020   PROT 6.5 01/20/2020   ALBUMIN 4.6 01/20/2020   CALCIUM 9.4 01/20/2020   GFRAA 69 01/20/2020    Speciality Comments: No specialty comments available.  Procedures:  No procedures performed Allergies: Statins   Assessment / Plan:     Visit Diagnoses: Idiopathic chronic gout of multiple sites without tophus - uric acid: 01/20/2020 8.1.  He has not had a gout flare.  Although his uric acid was not in the desirable range.  He states he has gained some weight.  We will check levels  today.  Medication monitoring encounter - Allopurinol 300 mg 1 tablet by mouth daily and Colchicine 0.6 mg 1 capsule by mouth daily as needed.  He has not used colchicine in many years.- Plan: CBC with Differential/Platelet, COMPLETE METABOLIC PANEL WITH GFR, Uric acid  Primary osteoarthritis of both hands-he continues to have some discomfort in his hands over PIP and DIP joints.  Joint protection was discussed.  Dupuytren's contracture of both hands-he had bilateral middle finger Dupuytren contractures.  Primary osteoarthritis of both knees-he denies any discomfort in his knee joints today.  History of renal stone - x1 with no recent recurrence.  History of coronary artery disease - Followed by cardiologist.  Dyslipidemia-dietary modifications were discussed.  Orders:  Orders Placed This Encounter  Procedures   CBC with Differential/Platelet   COMPLETE METABOLIC PANEL WITH GFR   Uric acid   No orders of the defined types were placed in this encounter.   Follow-Up Instructions: Return in about 6 months (around 01/19/2021) for Gout, Osteoarthritis.   Bo Merino, MD  Note - This record has been created using Editor, commissioning.  Chart creation errors have been sought, but may not always  have been located. Such creation errors do not reflect on  the standard of medical care.

## 2020-07-19 ENCOUNTER — Encounter: Payer: Self-pay | Admitting: Rheumatology

## 2020-07-19 ENCOUNTER — Other Ambulatory Visit: Payer: Self-pay

## 2020-07-19 ENCOUNTER — Ambulatory Visit: Payer: PPO | Admitting: Rheumatology

## 2020-07-19 VITALS — BP 121/78 | HR 76 | Ht 75.0 in | Wt 241.8 lb

## 2020-07-19 DIAGNOSIS — M19042 Primary osteoarthritis, left hand: Secondary | ICD-10-CM

## 2020-07-19 DIAGNOSIS — Z8679 Personal history of other diseases of the circulatory system: Secondary | ICD-10-CM

## 2020-07-19 DIAGNOSIS — M19041 Primary osteoarthritis, right hand: Secondary | ICD-10-CM | POA: Diagnosis not present

## 2020-07-19 DIAGNOSIS — M1A09X0 Idiopathic chronic gout, multiple sites, without tophus (tophi): Secondary | ICD-10-CM

## 2020-07-19 DIAGNOSIS — E785 Hyperlipidemia, unspecified: Secondary | ICD-10-CM | POA: Diagnosis not present

## 2020-07-19 DIAGNOSIS — M170 Bilateral primary osteoarthritis of knee: Secondary | ICD-10-CM | POA: Diagnosis not present

## 2020-07-19 DIAGNOSIS — Z87442 Personal history of urinary calculi: Secondary | ICD-10-CM

## 2020-07-19 DIAGNOSIS — Z5181 Encounter for therapeutic drug level monitoring: Secondary | ICD-10-CM

## 2020-07-19 DIAGNOSIS — M720 Palmar fascial fibromatosis [Dupuytren]: Secondary | ICD-10-CM | POA: Diagnosis not present

## 2020-07-20 LAB — CBC WITH DIFFERENTIAL/PLATELET
Absolute Monocytes: 892 cells/uL (ref 200–950)
Basophils Absolute: 59 cells/uL (ref 0–200)
Basophils Relative: 0.6 %
Eosinophils Absolute: 696 cells/uL — ABNORMAL HIGH (ref 15–500)
Eosinophils Relative: 7.1 %
HCT: 41 % (ref 38.5–50.0)
Hemoglobin: 12.8 g/dL — ABNORMAL LOW (ref 13.2–17.1)
Lymphs Abs: 1588 cells/uL (ref 850–3900)
MCH: 25.6 pg — ABNORMAL LOW (ref 27.0–33.0)
MCHC: 31.2 g/dL — ABNORMAL LOW (ref 32.0–36.0)
MCV: 82 fL (ref 80.0–100.0)
MPV: 11.4 fL (ref 7.5–12.5)
Monocytes Relative: 9.1 %
Neutro Abs: 6566 cells/uL (ref 1500–7800)
Neutrophils Relative %: 67 %
Platelets: 215 10*3/uL (ref 140–400)
RBC: 5 10*6/uL (ref 4.20–5.80)
RDW: 14.8 % (ref 11.0–15.0)
Total Lymphocyte: 16.2 %
WBC: 9.8 10*3/uL (ref 3.8–10.8)

## 2020-07-20 LAB — URIC ACID: Uric Acid, Serum: 5.2 mg/dL (ref 4.0–8.0)

## 2020-07-20 LAB — COMPLETE METABOLIC PANEL WITH GFR
AG Ratio: 2.3 (calc) (ref 1.0–2.5)
ALT: 19 U/L (ref 9–46)
AST: 20 U/L (ref 10–35)
Albumin: 4.3 g/dL (ref 3.6–5.1)
Alkaline phosphatase (APISO): 76 U/L (ref 35–144)
BUN/Creatinine Ratio: 9 (calc) (ref 6–22)
BUN: 13 mg/dL (ref 7–25)
CO2: 28 mmol/L (ref 20–32)
Calcium: 9.2 mg/dL (ref 8.6–10.3)
Chloride: 106 mmol/L (ref 98–110)
Creat: 1.44 mg/dL — ABNORMAL HIGH (ref 0.70–1.35)
Globulin: 1.9 g/dL (calc) (ref 1.9–3.7)
Glucose, Bld: 91 mg/dL (ref 65–99)
Potassium: 4.5 mmol/L (ref 3.5–5.3)
Sodium: 141 mmol/L (ref 135–146)
Total Bilirubin: 0.6 mg/dL (ref 0.2–1.2)
Total Protein: 6.2 g/dL (ref 6.1–8.1)
eGFR: 54 mL/min/{1.73_m2} — ABNORMAL LOW (ref >=60)

## 2020-07-20 NOTE — Progress Notes (Signed)
Anemia noted.  GFR is low.  Uric acid is in desirable range.  Please advise patient to discuss lab results with Dr. Hilma Favors and forward labs to Dr. Hilma Favors.

## 2020-07-27 DIAGNOSIS — Z23 Encounter for immunization: Secondary | ICD-10-CM | POA: Diagnosis not present

## 2020-09-26 ENCOUNTER — Other Ambulatory Visit: Payer: Self-pay | Admitting: Rheumatology

## 2020-09-26 IMAGING — CT CT HEART MORP W/ CTA COR W/ SCORE W/ CA W/CM &/OR W/O CM
2 of 11 series · 7 of 20 positions shown, 8 images · non-contrast
Comparison: None.
COMPARISON: None.

Addendum:
EXAM:
OVER-READ INTERPRETATION  CT CHEST

The following report is an over-read performed by radiologist Dr.
M Khalqi Toys Ajah [REDACTED] on 01/21/2019. This over-read
does not include interpretation of cardiac or coronary anatomy or
pathology. The coronary CTA interpretation by the cardiologist is
attached.
CLINICAL DATA: Chest pain.  Hx of CAD
Cardiac/Coronary  CTA
TECHNIQUE: The patient was scanned on a Siemens Somatoform go.Top scanner.

[Series 23: multiphase % cta coronary 0.60 · axial · 0.48mm/px · z∈[+1802,+1872]mm · 3 of 3262 slices shown]
[im 816/3262  vessel]
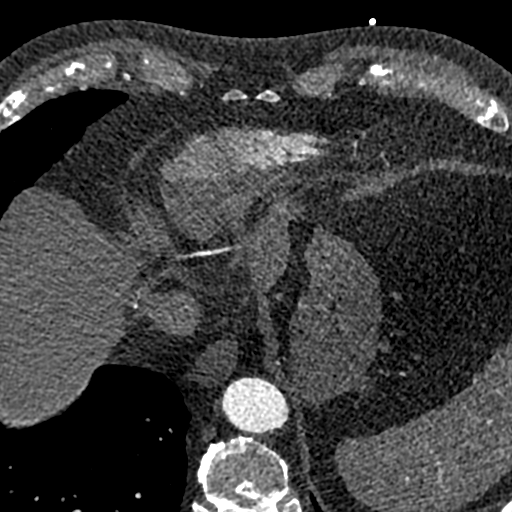
[im 1631/3262  vessel]
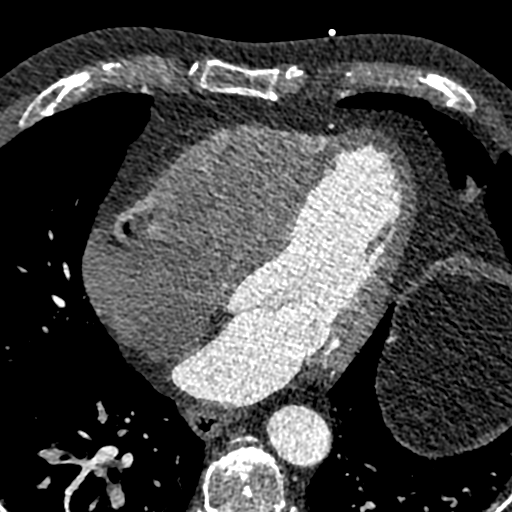
[im 2446/3262  vessel]
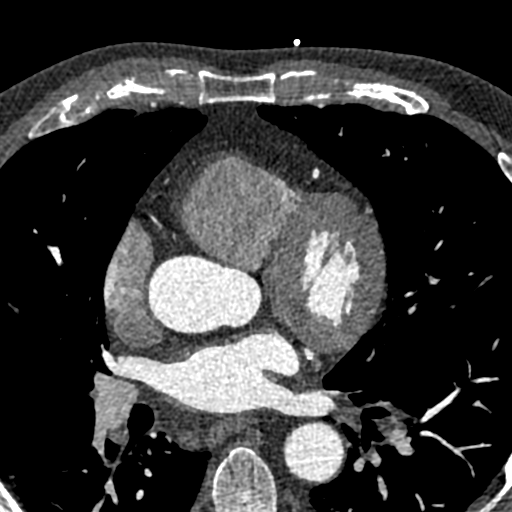

[Series 40: ms multiphase cta coronary 0.60 · axial · 0.48mm/px · z∈[+1795,+1879]mm · 4 of 3262 slices shown, 5 images]
[im 653/3262  vessel]
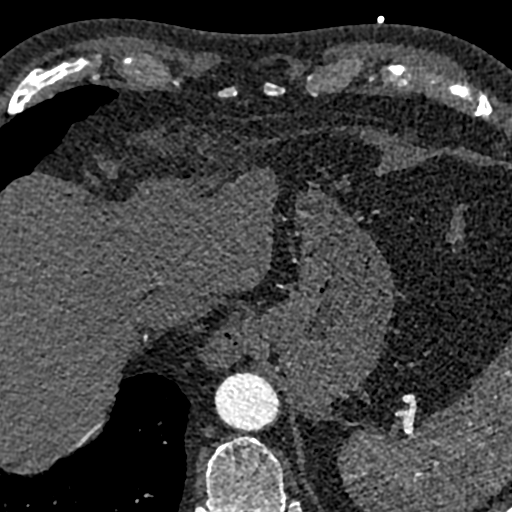
[im 653/3262  lung]
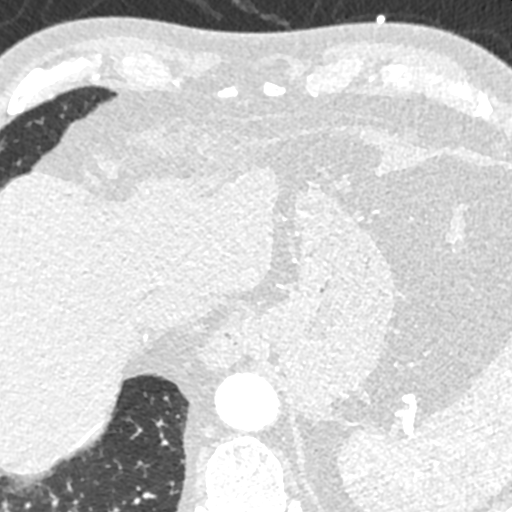
[im 1305/3262  vessel]
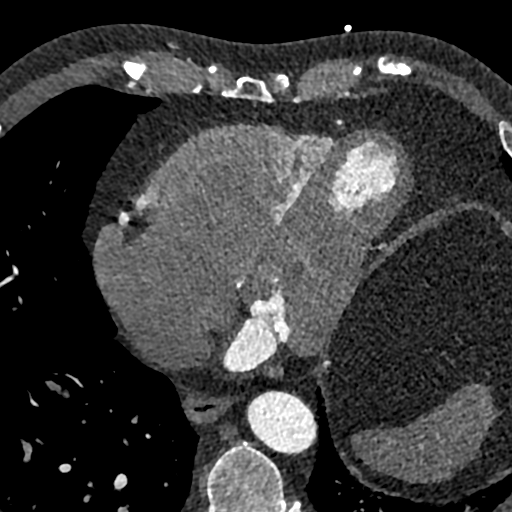
[im 1957/3262  vessel]
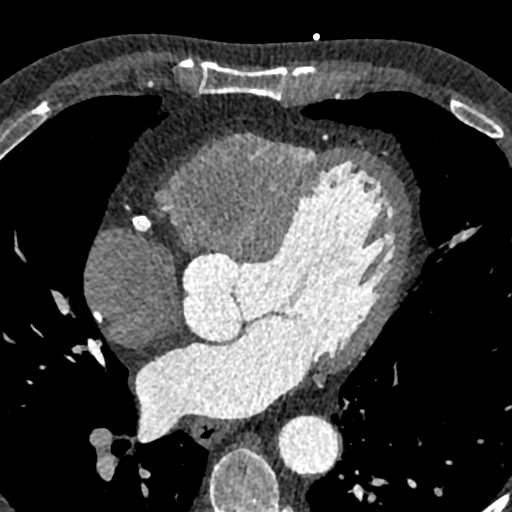
[im 2609/3262  vessel]
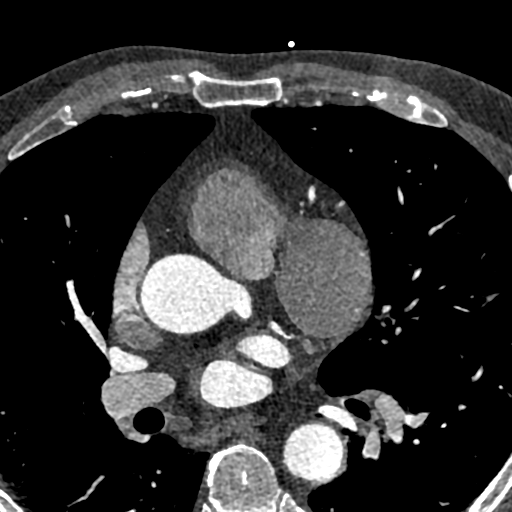

[7 of 20 positions shown; findings below may reference images not displayed]

FINDINGS: Vascular: Mild cardiomegaly. Scattered calcifications in the
descending thoracic aorta. Visualized aorta normal caliber.

Mediastinum/Nodes: No adenopathy in the lower mediastinum or hila.

Lungs/Pleura: Linear areas of scarring. No confluent opacities or
effusions.

Upper Abdomen: Imaging into the upper abdomen shows no acute
findings.

Musculoskeletal: Chest wall soft tissues are unremarkable. No acute
bony abnormality.
IMPRESSION: Scattered descending aortic calcifications.

Borderline heart size.

Areas of scarring in the visualized lungs.

No acute extra cardiac abnormality.
FINDINGS: A retrospective scan was triggered in the descending thoracic aorta.
Axial non-contrast 3 mm slices were carried out through the heart.
The data set was analyzed on a dedicated work station and scored
using the Agatson method. Gantry rotation speed was 330 msecs and
collimation was .6 mm. 100mg of metoprolol and 0.8 mg of sl NTG was
given. The 3D data set was reconstructed in 5% intervals of the
60-95 % of the R-R cycle. Diastolic phases were analyzed on a
dedicated work station using MPR, MIP and VRT modes. The patient
received 100 cc of contrast.

Aorta: Normal size. Descending aorta calcifications noted. No
dissection.

Aortic Valve:  Trileaflet.  No calcifications.

Coronary Arteries:  Normal coronary origin.  Right dominance.

RCA is a large dominant artery that gives rise to PDA and PLA. There
is calcified plaque in the proximal to mid RCA causing mild (25-49%)
stenosis in the mid segment.

Left main is a large artery that gives rise to LAD and LCX arteries.
There is calcified plaque causing minimal (0-24%) obstruction in the
mid LM

LAD is a large vessel that has heavily calcified plaque in the
proximal to mid vessel causing at least moderate stenosis (>50%) in
the mid segment. Severe stenosis could not be excluded due to
significant calcifications and blooming artifact.

LCX is a non-dominant artery that gives rise to one OM1 branch.
There is mild calcified plaque at the takeoff of the OM1 branch
causing minimal (<25%) stenosis in the mid LCx.

Other findings:

Normal pulmonary vein drainage into the left atrium.

Normal left atrial appendage without a thrombus.

Normal size of the pulmonary artery.
IMPRESSION: 1. Coronary calcium score of 1741. This was 95th percentile for age
and sex matched control.

2. Normal coronary origin with right dominance.

3.  No obstructive Calcification in the RCA and LCx arteries

4. Heavily calcified proximal and mid LAD causing at least moderate
stenosis. Significant stenosis could not be excluded due to
calcium/blooming artifact.

5. Atleast CAD-RADS 3. Moderate stenosis in the LAD. Consider
symptom-guided anti-ischemic pharmacotherapy as well as risk factor
modification per guideline directed care. Additional analysis with
CT FFR will be submitted.

*** End of Addendum ***
EXAM:
OVER-READ INTERPRETATION  CT CHEST

The following report is an over-read performed by radiologist Dr.
M Khalqi Toys Ajah [REDACTED] on 01/21/2019. This over-read
does not include interpretation of cardiac or coronary anatomy or
pathology. The coronary CTA interpretation by the cardiologist is
attached.
FINDINGS: Vascular: Mild cardiomegaly. Scattered calcifications in the
descending thoracic aorta. Visualized aorta normal caliber.

Mediastinum/Nodes: No adenopathy in the lower mediastinum or hila.

Lungs/Pleura: Linear areas of scarring. No confluent opacities or
effusions.

Upper Abdomen: Imaging into the upper abdomen shows no acute
findings.

Musculoskeletal: Chest wall soft tissues are unremarkable. No acute
bony abnormality.
IMPRESSION: Scattered descending aortic calcifications.

Borderline heart size.

Areas of scarring in the visualized lungs.

No acute extra cardiac abnormality.

## 2020-09-26 NOTE — Telephone Encounter (Signed)
Next Visit: 12/20/2020  Last Visit: 07/19/2020  Labs: 07/19/2020 Anemia noted.  GFR is low.  Uric acid is in desirable range  Current Dose per office note 07/19/2020: Allopurinol 300 mg 1 tablet by mouth daily  VG:VSYVGCYOYO chronic gout of multiple sites without tophus   Last Fill: 05/29/2020  Okay to refill Allopurinol?

## 2020-10-11 DIAGNOSIS — E782 Mixed hyperlipidemia: Secondary | ICD-10-CM | POA: Diagnosis not present

## 2020-10-11 DIAGNOSIS — G2581 Restless legs syndrome: Secondary | ICD-10-CM | POA: Diagnosis not present

## 2020-10-11 DIAGNOSIS — R109 Unspecified abdominal pain: Secondary | ICD-10-CM | POA: Diagnosis not present

## 2020-10-11 DIAGNOSIS — R7309 Other abnormal glucose: Secondary | ICD-10-CM | POA: Diagnosis not present

## 2020-10-11 DIAGNOSIS — I2510 Atherosclerotic heart disease of native coronary artery without angina pectoris: Secondary | ICD-10-CM | POA: Diagnosis not present

## 2020-10-11 DIAGNOSIS — F411 Generalized anxiety disorder: Secondary | ICD-10-CM | POA: Diagnosis not present

## 2020-10-11 DIAGNOSIS — I25118 Atherosclerotic heart disease of native coronary artery with other forms of angina pectoris: Secondary | ICD-10-CM | POA: Diagnosis not present

## 2020-10-11 DIAGNOSIS — E6609 Other obesity due to excess calories: Secondary | ICD-10-CM | POA: Diagnosis not present

## 2020-10-11 DIAGNOSIS — F419 Anxiety disorder, unspecified: Secondary | ICD-10-CM | POA: Diagnosis not present

## 2020-10-11 DIAGNOSIS — M109 Gout, unspecified: Secondary | ICD-10-CM | POA: Diagnosis not present

## 2020-10-11 DIAGNOSIS — R5383 Other fatigue: Secondary | ICD-10-CM | POA: Diagnosis not present

## 2020-10-24 DIAGNOSIS — G4730 Sleep apnea, unspecified: Secondary | ICD-10-CM | POA: Diagnosis not present

## 2020-11-06 DIAGNOSIS — G4733 Obstructive sleep apnea (adult) (pediatric): Secondary | ICD-10-CM | POA: Diagnosis not present

## 2020-12-06 NOTE — Progress Notes (Signed)
Office Visit Note  Patient: Peter Burch             Date of Birth: 03-19-1955           MRN: [id number]             PCP: Sharilyn Sites, MD Referring: Sharilyn Sites, MD Visit Date: 12/20/2020 Occupation: @GUAROCC @  Subjective:  Medication monitoring  History of Present Illness: Peter Burch is a 65 y.o. male with history of gout and osteoarthritis.  Patient is taking allopurinol 300 mg 1 tablet by mouth daily and colchicine 0.6 mg daily as needed during gout flares.  He is tolerating allopurinol without any side effects and has not missed any doses recently.  He denies any signs or symptoms of a gout flare.  He states that his last gout flare was 3 to 4 years ago.  He has not needed to take colchicine in over 2 years.  She denies any increased joint pain or joint swelling at this time.  His Dupuytren contractures in his hands are unchanged.    Activities of Daily Living:  Patient reports morning stiffness for 0 minutes.   Patient Denies nocturnal pain.  Difficulty dressing/grooming: Denies Difficulty climbing stairs: Denies Difficulty getting out of chair: Denies Difficulty using hands for taps, buttons, cutlery, and/or writing: Denies  Review of Systems  Constitutional:  Negative for fatigue.  HENT:  Negative for mouth sores, mouth dryness and nose dryness.   Eyes:  Negative for pain, itching and dryness.  Respiratory:  Negative for shortness of breath and difficulty breathing.   Cardiovascular:  Negative for chest pain and palpitations.  Gastrointestinal:  Negative for blood in stool, constipation and diarrhea.  Endocrine: Negative for increased urination.  Genitourinary:  Negative for difficulty urinating.  Musculoskeletal:  Negative for joint pain, joint pain, joint swelling, myalgias, morning stiffness, muscle tenderness and myalgias.  Skin:  Negative for color change, rash and redness.  Allergic/Immunologic: Negative for susceptible to infections.  Neurological:   Negative for dizziness, numbness, headaches, memory loss and weakness.  Hematological:  Negative for bruising/bleeding tendency.  Psychiatric/Behavioral:  Negative for confusion.    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/05/2016   Idiopathic chronic gout of multiple sites without tophus 01/15/2016   Mixed hyperlipidemia 09/01/2012   Atherosclerosis of coronary artery of native heart without angina pectoris 09/01/2012    Past Medical History:  Diagnosis Date   CAD (coronary artery disease)    Gout    Hypercholesteremia    Sinus bradycardia     Family History  Problem Relation Age of Onset   Heart attack Mother    Cancer Father        lung   Past Surgical History:  Procedure Laterality Date   CARDIAC CATHETERIZATION  05/16/2006   40% LAD lesion   COLONOSCOPY N/A 01/19/2015   Procedure: COLONOSCOPY;  Surgeon: Rogene Houston, MD;  Location[facility];  Service: Endoscopy;  Laterality: N/A;  [place]  07/31/2009   mod to severe defect due to infarct/scar w/mild perinfarct ischemia   US ECHOCARDIOGRAPHY  07/31/2009   impaired LV relaxation   Social History   Social History Narrative   Not on file   Immunization History  Administered Date(s) Administered  Moderna Sars-Covid-2 Vaccination 01/12/2019, 02/09/2019, 11/11/2019     Objective: Vital Signs: BP 110/79 (BP Location: Right Arm, Patient Position: Sitting, Cuff Size: Large)   Pulse 75   Ht 6\' 3"  (1.905 m)   Wt 238 lb (108 kg)   BMI 29.75 kg/m    Physical Exam Vitals and nursing note reviewed.  Constitutional:      Appearance: He is well-developed.  HENT:     Head: Normocephalic and atraumatic.  Eyes:     Conjunctiva/sclera: Conjunctivae normal.     Pupils: Pupils are equal, round, and reactive to  light.  Pulmonary:     Effort: Pulmonary effort is normal.  Abdominal:     Palpations: Abdomen is soft.  Musculoskeletal:     Cervical back: Normal range of motion and neck supple.  Skin:    General: Skin is warm and dry.     Capillary Refill: Capillary refill takes less than 2 seconds.  Neurological:     Mental Status: He is alert and oriented to person, place, and time.  Psychiatric:        Behavior: Behavior normal.     Musculoskeletal Exam: C-spine, thoracic spine, lumbar spine have good range of motion with no discomfort.  Shoulder joints, elbow joints, wrist joints, MCPs, PIPs, DIPs have good range of motion with no synovitis.  Dupuytren's contracture bilateral third fingers.  Complete fist formation bilaterally.  Hip joints have good range of motion with no groin pain.  Knee joints have good range of motion with no warmth or effusion.  Ankle joints have good range of motion with no tenderness or joint swelling.  CDAI Exam: CDAI Score: -- Patient Global: --; Provider Global: -- Swollen: --; Tender: -- Joint Exam 12/20/2020   No joint exam has been documented for this visit   There is currently no information documented on the homunculus. Go to the Rheumatology activity and complete the homunculus joint exam.  Investigation: No additional findings.  Imaging: No results found.  Recent Labs: Lab Results  Component Value Date   WBC 9.8 07/19/2020   HGB 12.8 (L) 07/19/2020   PLT 215 07/19/2020   NA 141 07/19/2020   K 4.5 07/19/2020   CL 106 07/19/2020   CO2 28 07/19/2020   GLUCOSE 91 07/19/2020   BUN 13 07/19/2020   CREATININE 1.44 (H) 07/19/2020   BILITOT 0.6 07/19/2020   ALKPHOS 95 01/20/2020   AST 20 07/19/2020   ALT 19 07/19/2020   PROT 6.2 07/19/2020   ALBUMIN 4.6 01/20/2020   CALCIUM 9.2 07/19/2020   GFRAA 69 01/20/2020    Speciality Comments: No specialty comments available.  Procedures:  No procedures performed Allergies: Statins   Assessment /  Plan:     Visit Diagnoses: Idiopathic chronic gout of multiple sites without tophus - He has not had any signs or symptoms of a gout flare.  He is clinically doing well taking allopurinol 300 mg 1 tablet by mouth daily and colchicine 0.6 mg 1 capsule daily as needed during flares.  According to the patient his last flare was 3 to 4 years ago and he has not taken colchicine in over 2 years.  He has been tolerating allopurinol without any side effects.  His uric acid was 5.2 on 07/19/2020.  Discussed that ideally his uric acid level should remain less than 6 so we will continue on the current dose of allopurinol as prescribed.  Uric acid, CBC, and CMP will be updated today.  He was advised to  notify us if he develops signs or symptoms of a gout flare.  Discussed the importance of avoiding a purine rich diet, beer, and high fructose corn syrup.  He voiced understanding.  He will follow-up in the office in 6 months.- Plan: Uric acid  Medication monitoring encounter - Allopurinol 300 mg 1 tablet by mouth daily and Colchicine 0.6 mg 1 capsule by mouth daily as needed. CBC and CMP were updated on 07/19/2020.  He is due to update lab work today.  Orders for CBC and CMP were released.  Uric acid was 5.2 which is within the desirable range on 07/19/2020.  Uric acid level will be rechecked today.- Plan: CBC with Differential/Platelet, COMPLETE METABOLIC PANEL WITH GFR, Uric acid  Primary osteoarthritis of both hands: He has PIP and DIP thickening consistent with osteoarthritis of both hands.  No tenderness or inflammation was noted.  Complete fist formation bilaterally.  Discussed the importance of joint protection and muscle strengthening.  Dupuytren's contracture of both hands - Bilateral middle finger Dupuytren contractures.  Unchanged.  No tenderness to palpation.  Primary osteoarthritis of both knees: He has good range of motion of both knee joints.  No warmth or effusion was noted.  Discussed the importance of  lower extremity muscle strengthening.  Other medical conditions are listed as follows:  History of renal stone -  x1 with no recent recurrence.  History of coronary artery disease - Followed by cardiologist.  Dyslipidemia  Orders: Orders Placed This Encounter  Procedures   CBC with Differential/Platelet   COMPLETE METABOLIC PANEL WITH GFR   Uric acid    No orders of the defined types were placed in this encounter.    Follow-Up Instructions: Return in about 6 months (around 06/20/2021) for Gout, Osteoarthritis.   Ofilia Neas, PA-C  Note - This record has been created using Dragon software.  Chart creation errors have been sought, but may not always  have been located. Such creation errors do not reflect on  the standard of medical care.

## 2020-12-20 ENCOUNTER — Other Ambulatory Visit: Payer: Self-pay

## 2020-12-20 ENCOUNTER — Encounter: Payer: Self-pay | Admitting: Physician Assistant

## 2020-12-20 ENCOUNTER — Ambulatory Visit: Payer: PPO | Admitting: Physician Assistant

## 2020-12-20 VITALS — BP 110/79 | HR 75 | Ht 75.0 in | Wt 238.0 lb

## 2020-12-20 DIAGNOSIS — Z5181 Encounter for therapeutic drug level monitoring: Secondary | ICD-10-CM | POA: Diagnosis not present

## 2020-12-20 DIAGNOSIS — M720 Palmar fascial fibromatosis [Dupuytren]: Secondary | ICD-10-CM | POA: Diagnosis not present

## 2020-12-20 DIAGNOSIS — M19042 Primary osteoarthritis, left hand: Secondary | ICD-10-CM | POA: Diagnosis not present

## 2020-12-20 DIAGNOSIS — M19041 Primary osteoarthritis, right hand: Secondary | ICD-10-CM | POA: Diagnosis not present

## 2020-12-20 DIAGNOSIS — Z87442 Personal history of urinary calculi: Secondary | ICD-10-CM

## 2020-12-20 DIAGNOSIS — M1A09X0 Idiopathic chronic gout, multiple sites, without tophus (tophi): Secondary | ICD-10-CM

## 2020-12-20 DIAGNOSIS — E785 Hyperlipidemia, unspecified: Secondary | ICD-10-CM

## 2020-12-20 DIAGNOSIS — Z8679 Personal history of other diseases of the circulatory system: Secondary | ICD-10-CM | POA: Diagnosis not present

## 2020-12-20 DIAGNOSIS — M170 Bilateral primary osteoarthritis of knee: Secondary | ICD-10-CM

## 2020-12-21 LAB — COMPLETE METABOLIC PANEL WITH GFR
AG Ratio: 2.2 (calc) (ref 1.0–2.5)
ALT: 15 U/L (ref 9–46)
AST: 14 U/L (ref 10–35)
Albumin: 4.4 g/dL (ref 3.6–5.1)
Alkaline phosphatase (APISO): 72 U/L (ref 35–144)
BUN: 14 mg/dL (ref 7–25)
CO2: 29 mmol/L (ref 20–32)
Calcium: 9.8 mg/dL (ref 8.6–10.3)
Chloride: 105 mmol/L (ref 98–110)
Creat: 1.32 mg/dL (ref 0.70–1.35)
Globulin: 2 g/dL (calc) (ref 1.9–3.7)
Glucose, Bld: 82 mg/dL (ref 65–99)
Potassium: 4.6 mmol/L (ref 3.5–5.3)
Sodium: 141 mmol/L (ref 135–146)
Total Bilirubin: 0.6 mg/dL (ref 0.2–1.2)
Total Protein: 6.4 g/dL (ref 6.1–8.1)
eGFR: 59 mL/min/{1.73_m2} — ABNORMAL LOW (ref >=60)

## 2020-12-21 LAB — CBC WITH DIFFERENTIAL/PLATELET
Absolute Monocytes: 768 cells/uL (ref 200–950)
Basophils Absolute: 53 cells/uL (ref 0–200)
Basophils Relative: 0.7 %
Eosinophils Absolute: 638 cells/uL — ABNORMAL HIGH (ref 15–500)
Eosinophils Relative: 8.4 %
HCT: 41.3 % (ref 38.5–50.0)
Hemoglobin: 13 g/dL — ABNORMAL LOW (ref 13.2–17.1)
Lymphs Abs: 1566 cells/uL (ref 850–3900)
MCH: 25.9 pg — ABNORMAL LOW (ref 27.0–33.0)
MCHC: 31.5 g/dL — ABNORMAL LOW (ref 32.0–36.0)
MCV: 82.4 fL (ref 80.0–100.0)
MPV: 11.9 fL (ref 7.5–12.5)
Monocytes Relative: 10.1 %
Neutro Abs: 4575 cells/uL (ref 1500–7800)
Neutrophils Relative %: 60.2 %
Platelets: 204 10*3/uL (ref 140–400)
RBC: 5.01 10*6/uL (ref 4.20–5.80)
RDW: 14.8 % (ref 11.0–15.0)
Total Lymphocyte: 20.6 %
WBC: 7.6 10*3/uL (ref 3.8–10.8)

## 2020-12-21 LAB — URIC ACID: Uric Acid, Serum: 6.6 mg/dL (ref 4.0–8.0)

## 2020-12-21 NOTE — Progress Notes (Signed)
GFR is borderline low-59.  >60 is WNL.  Creatinine is WNL. Rest of CMP WNL.   Hemoglobin is borderline low but has improved.  Absolute eosinophils are slightly elevated but have improved.   Uric acid is 6.6.  Please advise the patient to remain compliant taking allopurinol as prescribed and to avoid a purine rich diet.   Please forward lab results to PCP as requested.

## 2020-12-25 DIAGNOSIS — G4733 Obstructive sleep apnea (adult) (pediatric): Secondary | ICD-10-CM | POA: Diagnosis not present

## 2021-01-09 DIAGNOSIS — Z1331 Encounter for screening for depression: Secondary | ICD-10-CM | POA: Diagnosis not present

## 2021-01-09 DIAGNOSIS — Z6829 Body mass index (BMI) 29.0-29.9, adult: Secondary | ICD-10-CM | POA: Diagnosis not present

## 2021-01-09 DIAGNOSIS — E7849 Other hyperlipidemia: Secondary | ICD-10-CM | POA: Diagnosis not present

## 2021-01-09 DIAGNOSIS — Z0000 Encounter for general adult medical examination without abnormal findings: Secondary | ICD-10-CM | POA: Diagnosis not present

## 2021-01-09 DIAGNOSIS — R7309 Other abnormal glucose: Secondary | ICD-10-CM | POA: Diagnosis not present

## 2021-01-09 DIAGNOSIS — F419 Anxiety disorder, unspecified: Secondary | ICD-10-CM | POA: Diagnosis not present

## 2021-01-09 DIAGNOSIS — R972 Elevated prostate specific antigen [PSA]: Secondary | ICD-10-CM | POA: Diagnosis not present

## 2021-01-09 DIAGNOSIS — G4733 Obstructive sleep apnea (adult) (pediatric): Secondary | ICD-10-CM | POA: Diagnosis not present

## 2021-01-09 DIAGNOSIS — E6609 Other obesity due to excess calories: Secondary | ICD-10-CM | POA: Diagnosis not present

## 2021-01-09 DIAGNOSIS — R5383 Other fatigue: Secondary | ICD-10-CM | POA: Diagnosis not present

## 2021-01-09 DIAGNOSIS — I25118 Atherosclerotic heart disease of native coronary artery with other forms of angina pectoris: Secondary | ICD-10-CM | POA: Diagnosis not present

## 2021-01-09 DIAGNOSIS — G2581 Restless legs syndrome: Secondary | ICD-10-CM | POA: Diagnosis not present

## 2021-01-09 DIAGNOSIS — E782 Mixed hyperlipidemia: Secondary | ICD-10-CM | POA: Diagnosis not present

## 2021-01-09 DIAGNOSIS — R109 Unspecified abdominal pain: Secondary | ICD-10-CM | POA: Diagnosis not present

## 2021-01-09 DIAGNOSIS — I2510 Atherosclerotic heart disease of native coronary artery without angina pectoris: Secondary | ICD-10-CM | POA: Diagnosis not present

## 2021-01-11 DIAGNOSIS — K5900 Constipation, unspecified: Secondary | ICD-10-CM | POA: Diagnosis not present

## 2021-01-11 DIAGNOSIS — Z86010 Personal history of colonic polyps: Secondary | ICD-10-CM | POA: Diagnosis not present

## 2021-01-11 DIAGNOSIS — R140 Abdominal distension (gaseous): Secondary | ICD-10-CM | POA: Diagnosis not present

## 2021-01-19 DIAGNOSIS — D210 Benign neoplasm of connective and other soft tissue of head, face and neck: Secondary | ICD-10-CM | POA: Diagnosis not present

## 2021-01-19 DIAGNOSIS — D120 Benign neoplasm of cecum: Secondary | ICD-10-CM | POA: Diagnosis not present

## 2021-01-19 DIAGNOSIS — K648 Other hemorrhoids: Secondary | ICD-10-CM | POA: Diagnosis not present

## 2021-01-19 DIAGNOSIS — Z86010 Personal history of colonic polyps: Secondary | ICD-10-CM | POA: Diagnosis not present

## 2021-01-19 DIAGNOSIS — D125 Benign neoplasm of sigmoid colon: Secondary | ICD-10-CM | POA: Diagnosis not present

## 2021-01-19 DIAGNOSIS — K5730 Diverticulosis of large intestine without perforation or abscess without bleeding: Secondary | ICD-10-CM | POA: Diagnosis not present

## 2021-01-24 DIAGNOSIS — D125 Benign neoplasm of sigmoid colon: Secondary | ICD-10-CM | POA: Diagnosis not present

## 2021-01-24 DIAGNOSIS — D120 Benign neoplasm of cecum: Secondary | ICD-10-CM | POA: Diagnosis not present

## 2021-01-25 DIAGNOSIS — G4733 Obstructive sleep apnea (adult) (pediatric): Secondary | ICD-10-CM | POA: Diagnosis not present

## 2021-02-25 DIAGNOSIS — G4733 Obstructive sleep apnea (adult) (pediatric): Secondary | ICD-10-CM | POA: Diagnosis not present

## 2021-03-07 ENCOUNTER — Ambulatory Visit: Payer: PPO | Admitting: Cardiovascular Disease

## 2021-03-07 ENCOUNTER — Other Ambulatory Visit: Payer: Self-pay

## 2021-03-07 ENCOUNTER — Encounter: Payer: Self-pay | Admitting: Cardiovascular Disease

## 2021-03-07 VITALS — BP 124/76 | HR 63 | Ht 75.0 in | Wt 238.8 lb

## 2021-03-07 DIAGNOSIS — Z8739 Personal history of other diseases of the musculoskeletal system and connective tissue: Secondary | ICD-10-CM

## 2021-03-07 DIAGNOSIS — K5900 Constipation, unspecified: Secondary | ICD-10-CM

## 2021-03-07 DIAGNOSIS — R7303 Prediabetes: Secondary | ICD-10-CM

## 2021-03-07 DIAGNOSIS — R002 Palpitations: Secondary | ICD-10-CM | POA: Diagnosis not present

## 2021-03-07 DIAGNOSIS — G4733 Obstructive sleep apnea (adult) (pediatric): Secondary | ICD-10-CM | POA: Diagnosis not present

## 2021-03-07 DIAGNOSIS — E782 Mixed hyperlipidemia: Secondary | ICD-10-CM

## 2021-03-07 DIAGNOSIS — I2510 Atherosclerotic heart disease of native coronary artery without angina pectoris: Secondary | ICD-10-CM

## 2021-03-07 NOTE — Patient Instructions (Signed)
Medication Instructions:  ?The current medical regimen is effective;  continue present plan and medications. ? ?*If you need a refill on your cardiac medications before your next appointment, please call your pharmacy* ? ? ?Follow-Up: ?At [facility], you and your health needs are our priority.  As part of our continuing mission to provide you with exceptional heart care, we have created designated Provider Care Teams.  These Care Teams include your primary Cardiologist (physician) and Advanced Practice Providers (APPs -  Physician Assistants and Nurse Practitioners) who all work together to provide you with the care you need, when you need it. ? ?We recommend signing up for the patient portal called "MyChart".  Sign up information is provided on this After Visit Summary.  MyChart is used to connect with patients for Virtual Visits (Telemedicine).  Patients are able to view lab/test results, encounter notes, upcoming appointments, etc.  Non-urgent messages can be sent to your provider as well.   ?To learn more about what you can do with MyChart, go to [email or web address].   ? ?Your next appointment:   ?12 month(s) ? ?The format for your next appointment:   ?In Person ? ?Provider:   ?Sanda Klein, MD   ? ? ?Other Instructions ?K[employer] Mobile device or Apple Watch (with EKG) ? ?

## 2021-03-07 NOTE — Progress Notes (Signed)
Cardiology Office Note    Date:  03/07/2021   ID:  Peter Burch, DOB July 02, 1955, MRN [id number]  PCP:  Sharilyn Sites, MD  Cardiologist:   Sanda Klein, MD   Chief complaint: palpitations   History of Present Illness:  Peter Burch is a 66 y.o. male with known mild coronary artery disease (40% proximal LAD calcified lesion by cath 2008), without symptoms of angina pectoris and with a low risk nuclear stress test in 2011. He is known to have an inferior wall defect on nuclear stress test that is a false positive abnormality (this actually led to the heart cath in 2008).  Coronary CT in January 2021 showed no evidence of meaningful blockages, but his calcium score was 1351 (95th percentile) and he had a moderate proximal LAD stenosis estimated at about 50%, not significant by FFR.  He has hyperlipidemia but has been unable to tolerate numerous statins. He did take Zetia without side effects, but this was ineffective so he is currently receiving Repatha.  Couple of weeks ago he stood up very suddenly and experienced brief lightheadedness as well as some palpitations.  The symptoms scared him a little bit, but they have not recurred.  He has not experienced full-blown syncope.  He denies other focal neurological complaints.  He does not have intermittent claudication or lower extremity edema.  He has been feeling poorly over the last year and he thinks is mostly due to severe constipation.  He is drinking plenty of fluids and is taking fiber supplements and a stool softener.  He recently had a colonoscopy and has a GI follow-up on March 70.  He was also diagnosed with sleep apnea and prescribed CPAP, which he started wearing on December 18.  He is still struggling to adapt to it.  He often takes it off in the middle of the night.  He has not yet noticed any benefit from treatment.  He has not been exercising regularly.  He will sometimes go for 2 mile walks, but this happens sporadically and  sometimes he will not go walking for a couple of weeks.  He has not had any recent gout attacks.  Recent lipid profile performed on January 3 showed a total cholesterol 138, HDL 45, LDL 64 and borderline elevated triglycerides at 172.  His recent hemoglobin A1c was also mildly elevated at 5.9%.  In December 2022 creatinine was 1.32, potassium 4.6, normal liver function tests and in October 2022 his TSH was 2.100.   Past Medical History:  Diagnosis Date   CAD (coronary artery disease)    Gout    Hypercholesteremia    Sinus bradycardia     Past Surgical History:  Procedure Laterality Date   CARDIAC CATHETERIZATION  05/16/2006   40% LAD lesion   COLONOSCOPY N/A 01/19/2015   Procedure: COLONOSCOPY;  Surgeon: Rogene Houston, MD;  [facility];  Service: Endoscopy;  Laterality: N/A;  [place]  07/31/2009   mod to severe defect due to infarct/scar w/mild perinfarct ischemia   US ECHOCARDIOGRAPHY  07/31/2009   impaired LV relaxation    Current Medications: Outpatient Medications Prior to Visit  Medication Sig Dispense Refill   allopurinol (ZYLOPRIM) 300 MG tablet TAKE (1) TABLET BY MOUTH ONCE DAILY. 90 tablet 0   ALPRAZolam (XANAX) 0.5 MG tablet Take 0.25-0.5 tablets by mouth daily as needed for anxiety.      esomeprazole (NEXIUM) 20 MG capsule Take 20 mg by  mouth daily as needed (for GERD).     ezetimibe (ZETIA) 10 MG tablet Take 1 tablet (10 mg total) by mouth daily. 90 tablet 3   fluticasone (FLONASE) 50 MCG/ACT nasal spray Place 1 spray into the nose as needed for allergies.      MITIGARE 0.6 MG CAPS Take 0.6 mg by mouth daily. (Patient taking differently: Take 0.6 mg by mouth daily as needed (for pain).) 30 capsule 1   REPATHA SURECLICK 299 MG/ML SOAJ INJECT 140MG  INTO THE SKIN EVERY 14 DAYS. 2 mL 11   No facility-administered medications prior to visit.     Allergies:   Statins   Social History   Socioeconomic History   Marital status: Married     Spouse name: Not on file   Number of children: Not on file   Years of education: Not on file   Highest education level: Not on file  Occupational History   Not on file  Tobacco Use   Smoking status: Never   Smokeless tobacco: Never  Vaping Use   Vaping Use: Never used  Substance and Sexual Activity   Alcohol use: Not Currently   Drug use: No   Sexual activity: Not on file  Other Topics Concern   Not on file  Social History Narrative   Not on file   Social Determinants of Health   Financial Resource Strain: Not on file  Food Insecurity: Not on file  Transportation Needs: Not on file  Physical Activity: Not on file  Stress: Not on file  Social Connections: Not on file     Family History:  The patient's family history includes Cancer in his father; Heart attack in his mother.   ROS:   Please see the history of present illness.    ROS All other systems are reviewed and are negative.   PHYSICAL EXAM:   VS:  BP 124/76    Pulse 63    Ht 6\' 3"  (1.905 m)    Wt 238 lb 12.8 oz (108.3 kg)    SpO2 99%    BMI 29.85 kg/m       General: Alert, oriented x3, no distress, overweight, but appears fit Head: no evidence of trauma, PERRL, EOMI, no exophtalmos or lid lag, no myxedema, no xanthelasma; normal ears, nose and oropharynx Neck: normal jugular venous pulsations and no hepatojugular reflux; brisk carotid pulses without delay and no carotid bruits Chest: clear to auscultation, no signs of consolidation by percussion or palpation, normal fremitus, symmetrical and full respiratory excursions Cardiovascular: normal position and quality of the apical impulse, regular rhythm, normal first and second heart sounds, no murmurs, rubs or gallops Abdomen: no tenderness or distention, no masses by palpation, no abnormal pulsatility or arterial bruits, normal bowel sounds, no hepatosplenomegaly Extremities: no clubbing, cyanosis or edema; 2+ radial, ulnar and brachial pulses bilaterally; 2+  right femoral, posterior tibial and dorsalis pedis pulses; 2+ left femoral, posterior tibial and dorsalis pedis pulses; no subclavian or femoral bruits Neurological: grossly nonfocal Psych: Normal mood and affect   Wt Readings from Last 3 Encounters:  03/07/21 238 lb 12.8 oz (108.3 kg)  12/20/20 238 lb (108 kg)  07/19/20 241 lb 12.8 oz (109.7 kg)      Studies/Labs Reviewed:   EKG:  EKG is ordered today.  Personally reviewed and shows normal sinus rhythm and is a completely normal tracing.  As we were waiting to acquire the twelve-lead EKG I did see a single PVC on the screen.  Recent  Labs: 12/20/2020: ALT 15; BUN 14; Creat 1.32; Hemoglobin 13.0; Platelets 204; Potassium 4.6; Sodium 141   Lipid Panel    Component Value Date/Time   CHOL 101 04/28/2020 1035   TRIG 171 (H) 04/28/2020 1035   HDL 47 04/28/2020 1035   CHOLHDL 2.1 04/28/2020 1035   CHOLHDL 4.8 01/20/2019 1128   VLDL 36 01/20/2019 1128   LDLCALC 26 04/28/2020 1035      ASSESSMENT:    1. Atherosclerosis of native coronary artery of native heart without angina pectoris   2. Mixed hyperlipidemia   3. Prediabetes   4. Palpitations   5. OSA (obstructive sleep apnea)   6. Constipation, unspecified constipation type   7. History of gout      PLAN:  In order of problems listed above:  CAD: He has a very high plaque burden, but no significant flow-limiting stenoses.  He does not have angina pectoris.  To continue aggressive lipid-lowering therapy and encouraged more physical activity.   History of false positive nuclear stress test in the past.  Coronary CTA shows high plaque burden and a proximal LAD moderate lesion, but no flow-limiting stenoses angiographically or by CT FFR.   HLP: Excellent LDL cholesterol level on Repatha, which he will continue. PreDM: Continues to have periodic elevation in hemoglobin A1c in the prediabetes range, and this seems to correlate with his periods of more sedentary  lifestyle. Palpitations: Had a brief episode of palpitations associated with lightheadedness, but this also happened when he abruptly stood up.  No symptoms in the ensuing 2 weeks.  I am not sure and monitor would be fruitful.  Encouraged him to consider purchasing a commercially available device such as a smart watch or [employer] mobile.  He should call me back if he develops more episodes of near syncope or more frequent palpitations.  I do not think treatment with medications is justified at this point. OSA: Recently prescribed CPAP, still adjusting to therapy. Constipation: He is not taking any medicines that would be expected to cause this.  Encouraged more physical activity, walking.  Follow-up with GI. Hyperuricemia/history of gout: No recent events.   Medication Adjustments/Labs and Tests Ordered: Current medicines are reviewed at length with the patient today.  Concerns regarding medicines are outlined above.  Medication changes, Labs and Tests ordered today are listed in the Patient Instructions below. Patient Instructions  Medication Instructions:  The current medical regimen is effective;  continue present plan and medications.  *If you need a refill on your cardiac medications before your next appointment, please call your pharmacy*   Follow-Up: At [facility], you and your health needs are our priority.  As part of our continuing mission to provide you with exceptional heart care, we have created designated Provider Care Teams.  These Care Teams include your primary Cardiologist (physician) and Advanced Practice Providers (APPs -  Physician Assistants and Nurse Practitioners) who all work together to provide you with the care you need, when you need it.  We recommend signing up for the patient portal called "MyChart".  Sign up information is provided on this After Visit Summary.  MyChart is used to connect with patients for Virtual Visits (Telemedicine).  Patients are able to view  lab/test results, encounter notes, upcoming appointments, etc.  Non-urgent messages can be sent to your provider as well.   To learn more about what you can do with MyChart, go to [email or web address].    Your next appointment:   12 month(s)  The format for  your next appointment:   In Person  Provider:   Sanda Klein, MD     Other Instructions [facility] device or Apple Watch (with EKG)    Signed, Sanda Klein, MD  03/07/2021 8:53 AM    [address], [place], [place]  [address] Phone: [phone]; Fax: [phone]

## 2021-03-13 ENCOUNTER — Other Ambulatory Visit: Payer: Self-pay | Admitting: Cardiovascular Disease

## 2021-03-13 ENCOUNTER — Other Ambulatory Visit: Payer: Self-pay | Admitting: Rheumatology

## 2021-03-13 DIAGNOSIS — K5904 Chronic idiopathic constipation: Secondary | ICD-10-CM | POA: Diagnosis not present

## 2021-03-13 NOTE — Telephone Encounter (Signed)
Next Visit: 06/20/2021 ? ?Last Visit: 12/20/2020 ? ?Last Fill: 09/26/2020 ? ?DX: Idiopathic chronic gout of multiple sites without tophus ? ?Current Dose per office note 12/20/2020: Allopurinol 300 mg 1 tablet by mouth daily  ? ?Labs: 12/20/2020 GFR is borderline low-59.  >60 is WNL.  Creatinine is WNL. Rest of CMP WNL. Hemoglobin is borderline low but has improved.  Absolute eosinophils are slightly elevated but have improved.  Uric acid is 6.6. ? ?Okay to refill Allopurinol?  ?

## 2021-03-29 DIAGNOSIS — G4733 Obstructive sleep apnea (adult) (pediatric): Secondary | ICD-10-CM | POA: Diagnosis not present

## 2021-04-05 DIAGNOSIS — G4733 Obstructive sleep apnea (adult) (pediatric): Secondary | ICD-10-CM | POA: Diagnosis not present

## 2021-04-05 DIAGNOSIS — E663 Overweight: Secondary | ICD-10-CM | POA: Diagnosis not present

## 2021-04-05 DIAGNOSIS — Z6830 Body mass index (BMI) 30.0-30.9, adult: Secondary | ICD-10-CM | POA: Diagnosis not present

## 2021-04-25 DIAGNOSIS — G4733 Obstructive sleep apnea (adult) (pediatric): Secondary | ICD-10-CM | POA: Diagnosis not present

## 2021-05-25 DIAGNOSIS — G4733 Obstructive sleep apnea (adult) (pediatric): Secondary | ICD-10-CM | POA: Diagnosis not present

## 2021-06-06 NOTE — Progress Notes (Signed)
Office Visit Note  Patient: Peter Burch             Date of Birth: 05/19/1955           MRN: [id number]             PCP: Sharilyn Sites, MD Referring: Sharilyn Sites, MD Visit Date: 06/20/2021 Occupation: '@GUAROCC'$ @  Subjective:  Medication monitoring   History of Present Illness: Peter Burch is a 66 y.o. male with history of gout and osteoarthritis. He is taking allopurinol 300 mg 1 tablet by mouth daily and colchicine 0.6 mg as needed during flares.  Patient reports that about 1 month ago he was on vacation at the beach and had some shrimp and beer and started to experience a flare in his left wrist.  He states he took colchicine for 3 days which resolved his symptoms.  He states that this was his first flare in about 4 years.  He has not had any recurrence since then.  He states that while on vacation he was walking more frequently and started to have some increased discomfort in his right knee joint.  He states that the pain was worse with walking and was a 2 out of 10 on the pain scale.  He denies any mechanical symptoms or joint swelling at that time.  He states he wore a brace for several days which improved his symptoms.  He has not had any recurrence.  Patient reports that he was diagnosed with chronic constipation and was started on Linzess.  He continues to have some irregularities in his bowel movements but has noticed improvement.     Activities of Daily Living:  Patient reports morning stiffness for 0  none .   Patient Denies nocturnal pain.  Difficulty dressing/grooming: Denies Difficulty climbing stairs: Denies Difficulty getting out of chair: Denies Difficulty using hands for taps, buttons, cutlery, and/or writing: Denies  Review of Systems  Constitutional:  Positive for fatigue.  HENT:  Negative for mouth dryness.   Eyes:  Negative for dryness.  Respiratory:  Negative for shortness of breath.   Cardiovascular:  Negative for swelling in legs/feet.   Gastrointestinal:  Positive for constipation.  Endocrine: Negative for excessive thirst.  Genitourinary:  Negative for difficulty urinating.  Musculoskeletal:  Positive for joint pain and joint pain.  Skin:  Negative for rash.  Allergic/Immunologic: Negative for susceptible to infections.  Neurological:  Negative for numbness.  Hematological:  Negative for bruising/bleeding tendency.  Psychiatric/Behavioral:  Negative for sleep disturbance.     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/05/2016   Idiopathic chronic gout of multiple sites without tophus 01/15/2016   Mixed hyperlipidemia 09/01/2012   Atherosclerosis of coronary artery of native heart without angina pectoris 09/01/2012    Past Medical History:  Diagnosis Date   CAD (coronary artery disease)    Constipation    Gout    Hypercholesteremia    Sinus bradycardia     Family History  Problem Relation Age of Onset   Heart attack Mother    Cancer Father        lung   Past Surgical History:  Procedure Laterality Date   CARDIAC CATHETERIZATION  05/16/2006   40% LAD lesion   COLONOSCOPY N/A 01/19/2015   Procedure: COLONOSCOPY;  Surgeon: Bernadene Person  Gloriann Loan, MD;  [facility];  Service: Endoscopy;  Laterality: N/A;  [place]  07/31/2009   mod to severe defect due to infarct/scar w/mild perinfarct ischemia   US ECHOCARDIOGRAPHY  07/31/2009   impaired LV relaxation   Social History   Social History Narrative   Not on file   Immunization History  Administered Date(s) Administered   [employer] Vaccination 01/12/2019, 02/09/2019, 11/11/2019     Objective: Vital Signs: BP 122/82 (BP Location: Left Arm, Patient Position: Sitting, Cuff Size: Small)   Pulse 87   Resp 13   Ht '6\' 3"'$  (1.905 m)   Wt  244 lb 9.6 oz (110.9 kg)   BMI 30.57 kg/m    Physical Exam Vitals and nursing note reviewed.  Constitutional:      Appearance: He is well-developed.  HENT:     Head: Normocephalic and atraumatic.  Eyes:     Conjunctiva/sclera: Conjunctivae normal.     Pupils: Pupils are equal, round, and reactive to light.  Cardiovascular:     Rate and Rhythm: Normal rate and regular rhythm.     Heart sounds: Normal heart sounds.  Pulmonary:     Effort: Pulmonary effort is normal.     Breath sounds: Normal breath sounds.  Abdominal:     General: Bowel sounds are normal.     Palpations: Abdomen is soft.  Musculoskeletal:     Cervical back: Normal range of motion and neck supple.  Skin:    General: Skin is warm and dry.     Capillary Refill: Capillary refill takes less than 2 seconds.  Neurological:     Mental Status: He is alert and oriented to person, place, and time.  Psychiatric:        Behavior: Behavior normal.      Musculoskeletal Exam: C-spine, thoracic spine, and lumbar spine good ROM.  shoulder joints, elbow joints, wrist joints, MCPs, PIPs, DIPs have good range of motion with no synovitis.  PIP and DIP thickening consistent with osteoarthritis of both hands.  Complete fist formation bilaterally. Hip joints have good range of motion with no groin pain.  Knee joints have good range of motion with no warmth or effusion.  Ankle joints have good range of motion with no tenderness or joint swelling.  CDAI Exam: CDAI Score: -- Patient Global: --; Provider Global: -- Swollen: --; Tender: -- Joint Exam 06/20/2021   No joint exam has been documented for this visit   There is currently no information documented on the homunculus. Go to the Rheumatology activity and complete the homunculus joint exam.  Investigation: No additional findings.  Imaging: No results found.  Recent Labs: Lab Results  Component Value Date   WBC 7.6 12/20/2020   HGB 13.0 (L) 12/20/2020   PLT 204  12/20/2020   NA 141 12/20/2020   K 4.6 12/20/2020   CL 105 12/20/2020   CO2 29 12/20/2020   GLUCOSE 82 12/20/2020   BUN 14 12/20/2020   CREATININE 1.32 12/20/2020   BILITOT 0.6 12/20/2020   ALKPHOS 95 01/20/2020   AST 14 12/20/2020   ALT 15 12/20/2020   PROT 6.4 12/20/2020   ALBUMIN 4.6 01/20/2020   CALCIUM 9.8 12/20/2020   GFRAA 69 01/20/2020    Speciality Comments: No specialty comments available.  Procedures:  No procedures performed Allergies: Statins    Assessment / Plan:     Visit Diagnoses: Idiopathic chronic gout of multiple sites without tophus -He had  a gout flare in his left wrist about 1 month ago after having shrimp and beer while on vacation at the beach.  He took colchicine for 3 days and his symptoms resolved.  He has been compliant taking allopurinol 300 mg 1 tablet by mouth daily as prescribed.  He continues to tolerate allopurinol without any side effects.  This was his first gout flare in over 4 years per patient.  His uric acid was 6.6 on 12/20/20.  Uric acid, CBC and CMP will be updated today.  He will remain on the current dose of allopurinol.  He was advised to notify us when he needs additional refills.  He was advised to notify us if he develops signs or symptoms of a gout flare.  He was given an informational handout about dietary modifications for patients with gout.  He will follow-up in the office in 6 months or sooner if needed. Plan: Uric acid  Medication monitoring encounter - Allopurinol 300 mg 1 tablet by mouth daily and Colchicine 0.6 mg 1 capsule by mouth daily as needed.  Uric acid, CBC, and CMP will be updated today.- Plan: CBC with Differential/Platelet, COMPLETE METABOLIC PANEL WITH GFR, Uric acid  Primary osteoarthritis of both hands: He has PIP and DIP thickening consistent with osteoarthritis of both hands.  He experiences intermittent pain in his right third and fourth MCP joints but has no synovitis on examination today.  He was able to  make a complete fist bilaterally.  Discussed the importance of joint protection and muscle strengthening.  Dupuytren's contracture of both hands: Unchanged  Primary osteoarthritis of both knees: He has good range of motion of both knee joints on examination today.  No warmth or effusion was noted.  He has occasional pain in his right knee which is exacerbated by walking prolonged distances.  He has not been experiencing any mechanical symptoms or nocturnal pain.  He has not noticed any joint swelling.  He declined updated x-rays or cortisone injection at this time.  He was advised to notify us if he develops increased joint pain or joint swelling.  Other medical conditions are listed as follows:  History of renal stone  History of coronary artery disease  Dyslipidemia  History of hyperlipidemia  Orders: Orders Placed This Encounter  Procedures   CBC with Differential/Platelet   COMPLETE METABOLIC PANEL WITH GFR   Uric acid   No orders of the defined types were placed in this encounter.     Follow-Up Instructions: Return in about 6 months (around 12/20/2021) for Gout, Osteoarthritis.   Ofilia Neas, PA-C  Note - This record has been created using Dragon software.  Chart creation errors have been sought, but may not always  have been located. Such creation errors do not reflect on  the standard of medical care.

## 2021-06-07 ENCOUNTER — Other Ambulatory Visit: Payer: Self-pay | Admitting: Cardiovascular Disease

## 2021-06-07 DIAGNOSIS — E782 Mixed hyperlipidemia: Secondary | ICD-10-CM

## 2021-06-20 ENCOUNTER — Encounter: Payer: Self-pay | Admitting: Physician Assistant

## 2021-06-20 ENCOUNTER — Ambulatory Visit: Payer: PPO | Admitting: Physician Assistant

## 2021-06-20 VITALS — BP 122/82 | HR 87 | Resp 13 | Ht 75.0 in | Wt 244.6 lb

## 2021-06-20 DIAGNOSIS — M720 Palmar fascial fibromatosis [Dupuytren]: Secondary | ICD-10-CM | POA: Diagnosis not present

## 2021-06-20 DIAGNOSIS — M170 Bilateral primary osteoarthritis of knee: Secondary | ICD-10-CM | POA: Diagnosis not present

## 2021-06-20 DIAGNOSIS — Z87442 Personal history of urinary calculi: Secondary | ICD-10-CM | POA: Diagnosis not present

## 2021-06-20 DIAGNOSIS — M19042 Primary osteoarthritis, left hand: Secondary | ICD-10-CM | POA: Diagnosis not present

## 2021-06-20 DIAGNOSIS — E785 Hyperlipidemia, unspecified: Secondary | ICD-10-CM | POA: Diagnosis not present

## 2021-06-20 DIAGNOSIS — Z5181 Encounter for therapeutic drug level monitoring: Secondary | ICD-10-CM | POA: Diagnosis not present

## 2021-06-20 DIAGNOSIS — Z8639 Personal history of other endocrine, nutritional and metabolic disease: Secondary | ICD-10-CM

## 2021-06-20 DIAGNOSIS — Z8679 Personal history of other diseases of the circulatory system: Secondary | ICD-10-CM | POA: Diagnosis not present

## 2021-06-20 DIAGNOSIS — M1A09X0 Idiopathic chronic gout, multiple sites, without tophus (tophi): Secondary | ICD-10-CM

## 2021-06-20 DIAGNOSIS — M19041 Primary osteoarthritis, right hand: Secondary | ICD-10-CM | POA: Diagnosis not present

## 2021-06-20 NOTE — Patient Instructions (Signed)

## 2021-06-21 LAB — CBC WITH DIFFERENTIAL/PLATELET
Absolute Monocytes: 896 cells/uL (ref 200–950)
Basophils Absolute: 82 cells/uL (ref 0–200)
Basophils Relative: 0.8 %
Eosinophils Absolute: 721 cells/uL — ABNORMAL HIGH (ref 15–500)
Eosinophils Relative: 7 %
HCT: 42.7 % (ref 38.5–50.0)
Hemoglobin: 13.3 g/dL (ref 13.2–17.1)
Lymphs Abs: 1617 cells/uL (ref 850–3900)
MCH: 26.3 pg — ABNORMAL LOW (ref 27.0–33.0)
MCHC: 31.1 g/dL — ABNORMAL LOW (ref 32.0–36.0)
MCV: 84.6 fL (ref 80.0–100.0)
MPV: 11.8 fL (ref 7.5–12.5)
Monocytes Relative: 8.7 %
Neutro Abs: 6983 cells/uL (ref 1500–7800)
Neutrophils Relative %: 67.8 %
Platelets: 201 10*3/uL (ref 140–400)
RBC: 5.05 10*6/uL (ref 4.20–5.80)
RDW: 14.6 % (ref 11.0–15.0)
Total Lymphocyte: 15.7 %
WBC: 10.3 10*3/uL (ref 3.8–10.8)

## 2021-06-21 LAB — COMPLETE METABOLIC PANEL WITH GFR
AG Ratio: 2 (calc) (ref 1.0–2.5)
ALT: 21 U/L (ref 9–46)
AST: 20 U/L (ref 10–35)
Albumin: 4.4 g/dL (ref 3.6–5.1)
Alkaline phosphatase (APISO): 76 U/L (ref 35–144)
BUN: 16 mg/dL (ref 7–25)
CO2: 26 mmol/L (ref 20–32)
Calcium: 9.7 mg/dL (ref 8.6–10.3)
Chloride: 108 mmol/L (ref 98–110)
Creat: 1.28 mg/dL (ref 0.70–1.35)
Globulin: 2.2 g/dL (calc) (ref 1.9–3.7)
Glucose, Bld: 89 mg/dL (ref 65–99)
Potassium: 4.4 mmol/L (ref 3.5–5.3)
Sodium: 144 mmol/L (ref 135–146)
Total Bilirubin: 0.5 mg/dL (ref 0.2–1.2)
Total Protein: 6.6 g/dL (ref 6.1–8.1)
eGFR: 61 mL/min/{1.73_m2} (ref >=60)

## 2021-06-21 LAB — URIC ACID: Uric Acid, Serum: 6.2 mg/dL (ref 4.0–8.0)

## 2021-06-21 NOTE — Progress Notes (Signed)
CMP WNL.  CBC stable.  Uric acid was 6.2-improved.  Ideally his uric acid should be less than 6.  Please encourage the patient to remain on allopurinol as prescribed and to avoid a purine rich diet.

## 2021-06-25 DIAGNOSIS — G4733 Obstructive sleep apnea (adult) (pediatric): Secondary | ICD-10-CM | POA: Diagnosis not present

## 2021-06-27 DIAGNOSIS — G4733 Obstructive sleep apnea (adult) (pediatric): Secondary | ICD-10-CM | POA: Diagnosis not present

## 2021-07-08 ENCOUNTER — Encounter (HOSPITAL_COMMUNITY): Payer: Self-pay

## 2021-07-08 ENCOUNTER — Emergency Department (HOSPITAL_COMMUNITY): Payer: PPO

## 2021-07-08 ENCOUNTER — Emergency Department (HOSPITAL_COMMUNITY)
Admission: EM | Admit: 2021-07-08 | Discharge: 2021-07-09 | Disposition: A | Discharge status: Home / Self Care | Payer: PPO | Attending: Emergency Medicine | Admitting: Emergency Medicine

## 2021-07-08 ENCOUNTER — Other Ambulatory Visit: Payer: Self-pay

## 2021-07-08 DIAGNOSIS — I2510 Atherosclerotic heart disease of native coronary artery without angina pectoris: Secondary | ICD-10-CM | POA: Diagnosis not present

## 2021-07-08 DIAGNOSIS — F419 Anxiety disorder, unspecified: Secondary | ICD-10-CM | POA: Diagnosis not present

## 2021-07-08 DIAGNOSIS — E860 Dehydration: Secondary | ICD-10-CM | POA: Diagnosis not present

## 2021-07-08 DIAGNOSIS — I700 Atherosclerosis of aorta: Secondary | ICD-10-CM | POA: Diagnosis not present

## 2021-07-08 DIAGNOSIS — R29818 Other symptoms and signs involving the nervous system: Secondary | ICD-10-CM | POA: Diagnosis not present

## 2021-07-08 DIAGNOSIS — J9811 Atelectasis: Secondary | ICD-10-CM | POA: Diagnosis not present

## 2021-07-08 DIAGNOSIS — R4182 Altered mental status, unspecified: Secondary | ICD-10-CM | POA: Diagnosis not present

## 2021-07-08 DIAGNOSIS — R918 Other nonspecific abnormal finding of lung field: Secondary | ICD-10-CM | POA: Diagnosis not present

## 2021-07-08 DIAGNOSIS — R55 Syncope and collapse: Secondary | ICD-10-CM | POA: Diagnosis not present

## 2021-07-08 LAB — URINALYSIS, ROUTINE W REFLEX MICROSCOPIC
Bilirubin Urine: NEGATIVE
Glucose, UA: NEGATIVE mg/dL
Hgb urine dipstick: NEGATIVE
Ketones, ur: NEGATIVE mg/dL
Leukocytes,Ua: NEGATIVE
Nitrite: NEGATIVE
Protein, ur: NEGATIVE mg/dL
Specific Gravity, Urine: 1.014 (ref 1.005–1.030)
pH: 5 (ref 5.0–8.0)

## 2021-07-08 LAB — HEPATIC FUNCTION PANEL
ALT: 24 U/L (ref 0–44)
AST: 20 U/L (ref 15–41)
Albumin: 4.2 g/dL (ref 3.5–5.0)
Alkaline Phosphatase: 72 U/L (ref 38–126)
Bilirubin, Direct: 0.1 mg/dL (ref 0.0–0.2)
Indirect Bilirubin: 0.5 mg/dL (ref 0.3–0.9)
Total Bilirubin: 0.6 mg/dL (ref 0.3–1.2)
Total Protein: 7.1 g/dL (ref 6.5–8.1)

## 2021-07-08 LAB — CBC
HCT: 42.1 % (ref 39.0–52.0)
Hemoglobin: 13.6 g/dL (ref 13.0–17.0)
MCH: 27.1 pg (ref 26.0–34.0)
MCHC: 32.3 g/dL (ref 30.0–36.0)
MCV: 83.9 fL (ref 80.0–100.0)
Platelets: 205 10*3/uL (ref 150–400)
RBC: 5.02 MIL/uL (ref 4.22–5.81)
RDW: 15.5 % (ref 11.5–15.5)
WBC: 8.9 10*3/uL (ref 4.0–10.5)
nRBC: 0 % (ref 0.0–0.2)

## 2021-07-08 LAB — CBG MONITORING, ED: Glucose-Capillary: 149 mg/dL — ABNORMAL HIGH (ref 70–99)

## 2021-07-08 LAB — BASIC METABOLIC PANEL
Anion gap: 9 (ref 5–15)
BUN: 15 mg/dL (ref 8–23)
CO2: 24 mmol/L (ref 22–32)
Calcium: 9.1 mg/dL (ref 8.9–10.3)
Chloride: 104 mmol/L (ref 98–111)
Creatinine, Ser: 1.48 mg/dL — ABNORMAL HIGH (ref 0.61–1.24)
GFR, Estimated: 52 mL/min — ABNORMAL LOW (ref >60)
Glucose, Bld: 128 mg/dL — ABNORMAL HIGH (ref 70–99)
Potassium: 4 mmol/L (ref 3.5–5.1)
Sodium: 137 mmol/L (ref 135–145)

## 2021-07-08 LAB — TROPONIN I (HIGH SENSITIVITY)
Troponin I (High Sensitivity): <2 ng/L (ref <18)
Troponin I (High Sensitivity): <2 ng/L (ref <18)

## 2021-07-08 MED ORDER — SODIUM CHLORIDE 0.9 % IV SOLN: INTRAVENOUS | Status: DC

## 2021-07-08 MED ORDER — LORAZEPAM 0.5 MG PO TABS: 0.5000 mg | ORAL_TABLET | Freq: Once | ORAL | Status: DC

## 2021-07-08 MED ORDER — LORAZEPAM 2 MG/ML IJ SOLN
1.0000 mg | Freq: Once | INTRAMUSCULAR | Status: AC | PRN
  Administered 2021-07-09: 1 mg via INTRAVENOUS
  Filled 2021-07-08: qty 1

## 2021-07-08 MED ORDER — LORAZEPAM 2 MG/ML IJ SOLN
0.5000 mg | Freq: Once | INTRAMUSCULAR | Status: AC
  Administered 2021-07-08: 0.5 mg via INTRAVENOUS
  Filled 2021-07-08: qty 1

## 2021-07-08 MED ORDER — SODIUM CHLORIDE 0.9 % IV BOLUS
1000.0000 mL | Freq: Once | INTRAVENOUS | Status: AC
  Administered 2021-07-08: 1000 mL via INTRAVENOUS

## 2021-07-08 NOTE — ED Provider Notes (Signed)
[facility], [place] EMERGENCY DEPARTMENT Provider Note   CSN: [id number] Arrival date & time: 07/08/21  1952     History  Chief Complaint  Patient presents with   Near Syncope    Peter Burch is a 66 y.o. male.  Pt is a 66 yo male with pmhx significant for CAD, gout, high cholesterol, and constipation.  Pt said he has not felt well today.  He went to a church function this evening and was talking to his nephew.  He said he lost time while talking to him and thinks he passed out.  Pt said he did not fall.  Pt said time seems very strange to him.  He is very anxious as well.  Much more than usual.  He denies any trouble moving arms or legs.  He denies trouble speaking.  No vision changes.  No cp.  No sob.  No f/c.  He feels like he is having trouble finding words.        Home Medications Prior to Admission medications   Medication Sig Start Date End Date Taking? Authorizing Provider  allopurinol (ZYLOPRIM) 300 MG tablet TAKE (1) TABLET BY MOUTH ONCE DAILY. 03/13/21   Ofilia Neas, PA-C  ALPRAZolam Duanne Moron) 0.5 MG tablet Take 0.25-0.5 tablets by mouth daily as needed for anxiety.  05/15/12   [provider]  esomeprazole (NEXIUM) 20 MG capsule Take 20 mg by mouth daily as needed (for GERD).    [provider]  ezetimibe (ZETIA) 10 MG tablet TAKE ONE TABLET BY MOUTH ONCE DAILY. 03/13/21   Croitoru, Mihai, MD  fluticasone (FLONASE) 50 MCG/ACT nasal spray Place 1 spray into the nose as needed for allergies.  05/15/12   [provider]  LINZESS 72 MCG capsule Take 72 mcg by mouth as needed. 06/04/21   [provider]  MITIGARE 0.6 MG CAPS Take 0.6 mg by mouth daily. Patient taking differently: Take 0.6 mg by mouth daily as needed (for pain). 07/01/17   Ofilia Neas, PA-C  REPATHA SURECLICK 725 MG/ML SOAJ INJECT '140MG'$  INTO THE SKIN EVERY 14 DAYS. 06/07/21   Croitoru, Dani Gobble, MD      Allergies    Statins    Review of Systems   Review of Systems  Neurological:   Positive for weakness.  All other systems reviewed and are negative.   Physical Exam Updated Vital Signs BP (!) 138/100   Pulse (!) 110   Resp (!) 21   Ht '6\' 3"'$  (1.905 m)   Wt 111 kg   SpO2 96%   BMI 30.57 kg/m  Physical Exam Vitals and nursing note reviewed.  Constitutional:      Appearance: Normal appearance.  HENT:     Head: Normocephalic and atraumatic.     Right Ear: External ear normal.     Left Ear: External ear normal.     Nose: Nose normal.     Mouth/Throat:     Mouth: Mucous membranes are moist.     Pharynx: Oropharynx is clear.  Eyes:     Extraocular Movements: Extraocular movements intact.     Conjunctiva/sclera: Conjunctivae normal.     Pupils: Pupils are equal, round, and reactive to light.  Cardiovascular:     Rate and Rhythm: Normal rate and regular rhythm.     Pulses: Normal pulses.     Heart sounds: Normal heart sounds.  Pulmonary:     Effort: Pulmonary effort is normal.     Breath sounds: Normal breath sounds.  Abdominal:     General: Abdomen is flat. Bowel sounds are normal.     Palpations: Abdomen is soft.  Musculoskeletal:        General: Normal range of motion.     Cervical back: Normal range of motion and neck supple.  Skin:    General: Skin is warm.     Capillary Refill: Capillary refill takes less than 2 seconds.  Neurological:     General: No focal deficit present.     Mental Status: He is alert and oriented to person, place, and time.  Psychiatric:        Mood and Affect: Mood is anxious.        Behavior: Behavior normal.        Thought Content: Thought content normal.        Judgment: Judgment normal.     ED Results / Procedures / Treatments   Labs (all labs ordered are listed, but only abnormal results are displayed) Labs Reviewed  BASIC METABOLIC PANEL - Abnormal; Notable for the following components:      Result Value   Glucose, Bld 128 (*)    Creatinine, Ser 1.48 (*)    GFR, Estimated 52 (*)    All other components  within normal limits  CBG MONITORING, ED - Abnormal; Notable for the following components:   Glucose-Capillary 149 (*)    All other components within normal limits  CBC  URINALYSIS, ROUTINE W REFLEX MICROSCOPIC  HEPATIC FUNCTION PANEL  TROPONIN I (HIGH SENSITIVITY)  TROPONIN I (HIGH SENSITIVITY)    EKG EKG Interpretation  Date/Time:  Sunday July 08 2021 20:02:25 EDT Ventricular Rate:  96 PR Interval:  177 QRS Duration: 102 QT Interval:  347 QTC Calculation: 439 R Axis:   4 Text Interpretation: Sinus rhythm Ventricular premature complex Abnormal R-wave progression, early transition No significant change since last tracing Confirmed by Isla Pence [id number]) on 07/08/2021 8:07:11 PM  Radiology CT Head Wo Contrast  Result Date: 07/08/2021 CLINICAL DATA:  Recent syncopal episode with altered mental status EXAM: CT HEAD WITHOUT CONTRAST TECHNIQUE: Contiguous axial images were obtained from the base of the skull through the vertex without intravenous contrast. RADIATION DOSE REDUCTION: This exam was performed according to the departmental dose-optimization program which includes automated exposure control, adjustment of the mA and/or kV according to patient size and/or use of iterative reconstruction technique. COMPARISON:  None Available. FINDINGS: Brain: No evidence of acute infarction, hemorrhage, hydrocephalus, extra-axial collection or mass lesion/mass effect. Vascular: No hyperdense vessel or unexpected calcification. Skull: Normal. Negative for fracture or focal lesion. Sinuses/Orbits: No acute finding. Other: None. IMPRESSION: No acute abnormality noted. Electronically Signed   By: Inez Catalina M.D.   On: 07/08/2021 21:46   DG Chest Port 1 View  Result Date: 07/08/2021 CLINICAL DATA:  Syncopal versus near syncopal episode. EXAM: PORTABLE CHEST 1 VIEW COMPARISON:  Portable chest 12/08/2018 FINDINGS: The cardiac size is normal. Mediastinal configuration is stable allowing for expiration  with mild aortic tortuosity and atherosclerosis. The lungs are expiratory with obscuration of the bases. Visualized lungs are clear but a lower zonal infiltrate could be obscured. There is mild perihilar linear atelectasis. The sulci are sharp. No acute osseous findings. IMPRESSION: Expiratory study. The visualized lungs are clear of infiltrates but a lower zonal infiltrate would be obscured. No other evidence of acute chest process. Electronically Signed   By: Telford Nab M.D.   On: 07/08/2021 21:11    Procedures Procedures    Medications Ordered in  ED Medications  sodium chloride 0.9 % bolus 1,000 mL (1,000 mLs Intravenous New Bag/Given 07/08/21 2028)    And  0.9 %  sodium chloride infusion ( Intravenous New Bag/Given 07/08/21 2043)  LORazepam (ATIVAN) injection 1 mg (has no administration in time range)  LORazepam (ATIVAN) injection 0.5 mg (0.5 mg Intravenous Given 07/08/21 2124)    ED Course/ Medical Decision Making/ A&P                           Medical Decision Making Amount and/or Complexity of Data Reviewed Labs: ordered. Radiology: ordered.  Risk Prescription drug management.   This patient presents to the ED for concern of ams, this involves an extensive number of treatment options, and is a complaint that carries with it a high risk of complications and morbidity.  The differential diagnosis includes cva, tia, syncope, electrolyte abn   Co morbidities that complicate the patient evaluation  CAD, gout, high cholesterol, and constipation   Additional history obtained:  Additional history obtained from epic chart review External records from outside source obtained and reviewed including EMS report   Lab Tests:  I Ordered, and personally interpreted labs.  The pertinent results include:  bmp with cr 1.48, cbc nl, trop nl, ua nl   Imaging Studies ordered:  I ordered imaging studies including cxr and ct head I independently visualized and interpreted imaging which  showed  IMPRESSION:  Expiratory study. The visualized lungs are clear of infiltrates but  a lower zonal infiltrate would be obscured. No other evidence of  acute chest process.  CT head: IMPRESSION:  No acute abnormality noted.   I agree with the radiologist interpretation   Cardiac Monitoring:  The patient was maintained on a cardiac monitor.  I personally viewed and interpreted the cardiac monitored which showed an underlying rhythm of: nsr   Medicines ordered and prescription drug management:  I ordered medication including ivfs and ativan  for dehydration and anxiety  Reevaluation of the patient after these medicines showed that the patient improved I have reviewed the patients home medicines and have made adjustments as needed   Test Considered:  Mri brain   Critical Interventions:  MRI    Problem List / ED Course:  AMS:  pt is feeling better after the ativan.  However, he is still having some word finding difficulties which is abnormal for him.  Pt will need a MRI to r/o stroke.  Pt d/w Dr. Sedonia Small at [facility] ED and he did accept him for transfer.     Reevaluation:  After the interventions noted above, I reevaluated the patient and found that they have :stayed the same   Social Determinants of Health:  Lives at home   Dispostion:  After consideration of the diagnostic results and the patients response to treatment, I feel that the patent would benefit from transfer to [facility] ED for a MRI brain.          Final Clinical Impression(s) / ED Diagnoses Final diagnoses:  Altered mental status, unspecified altered mental status type    Rx / DC Orders ED Discharge Orders     None         Isla Pence, MD 07/08/21 2335

## 2021-07-08 NOTE — ED Notes (Signed)
While standing pt felt dizzy and wobbly like he was standing on pin rolls. Bp kept rising

## 2021-07-08 NOTE — ED Triage Notes (Signed)
Rcems from home. Cc of possible loc. Pt was talking when he had a brief moment of where he blacked out thinking he passed out but never hit the ground.  Pt was alert upon ems arrival.  Vss with ems.

## 2021-07-09 ENCOUNTER — Emergency Department (HOSPITAL_COMMUNITY): Payer: PPO

## 2021-07-09 DIAGNOSIS — R55 Syncope and collapse: Secondary | ICD-10-CM | POA: Diagnosis present

## 2021-07-09 DIAGNOSIS — I2510 Atherosclerotic heart disease of native coronary artery without angina pectoris: Secondary | ICD-10-CM | POA: Diagnosis not present

## 2021-07-09 DIAGNOSIS — R29818 Other symptoms and signs involving the nervous system: Secondary | ICD-10-CM | POA: Diagnosis not present

## 2021-07-09 DIAGNOSIS — R4182 Altered mental status, unspecified: Secondary | ICD-10-CM | POA: Diagnosis not present

## 2021-07-09 DIAGNOSIS — F419 Anxiety disorder, unspecified: Secondary | ICD-10-CM | POA: Diagnosis not present

## 2021-07-09 DIAGNOSIS — E860 Dehydration: Secondary | ICD-10-CM | POA: Diagnosis not present

## 2021-07-09 NOTE — ED Notes (Signed)
Patient transported to MRI 

## 2021-07-09 NOTE — ED Provider Notes (Signed)
1:07 AM Arrives from [facility] ED for MRI brain.  States feels like symptoms improved.  No longer having pauses in conversation or word finding difficulties.  Does admit to some anxiety earlier.  Unsure if he will be able to tolerate MRI due to claustrophobia but is willing to try.  Pre-medicate with ativan.  Results for orders placed or performed during the hospital encounter of 78/58/85  Basic metabolic panel  Result Value Ref Range   Sodium 137 135 - 145 mmol/L   Potassium 4.0 3.5 - 5.1 mmol/L   Chloride 104 98 - 111 mmol/L   CO2 24 22 - 32 mmol/L   Glucose, Bld 128 (H) 70 - 99 mg/dL   BUN 15 8 - 23 mg/dL   Creatinine, Ser 1.48 (H) 0.61 - 1.24 mg/dL   Calcium 9.1 8.9 - 10.3 mg/dL   GFR, Estimated 52 (L) >60 mL/min   Anion gap 9 5 - 15  CBC  Result Value Ref Range   WBC 8.9 4.0 - 10.5 K/uL   RBC 5.02 4.22 - 5.81 MIL/uL   Hemoglobin 13.6 13.0 - 17.0 g/dL   HCT 42.1 39.0 - 52.0 %   MCV 83.9 80.0 - 100.0 fL   MCH 27.1 26.0 - 34.0 pg   MCHC 32.3 30.0 - 36.0 g/dL   RDW 15.5 11.5 - 15.5 %   Platelets 205 150 - 400 K/uL   nRBC 0.0 0.0 - 0.2 %  Urinalysis, Routine w reflex microscopic Urine, Clean Catch  Result Value Ref Range   Color, Urine YELLOW YELLOW   APPearance CLEAR CLEAR   Specific Gravity, Urine 1.014 1.005 - 1.030   pH 5.0 5.0 - 8.0   Glucose, UA NEGATIVE NEGATIVE mg/dL   Hgb urine dipstick NEGATIVE NEGATIVE   Bilirubin Urine NEGATIVE NEGATIVE   Ketones, ur NEGATIVE NEGATIVE mg/dL   Protein, ur NEGATIVE NEGATIVE mg/dL   Nitrite NEGATIVE NEGATIVE   Leukocytes,Ua NEGATIVE NEGATIVE  Hepatic function panel  Result Value Ref Range   Total Protein 7.1 6.5 - 8.1 g/dL   Albumin 4.2 3.5 - 5.0 g/dL   AST 20 15 - 41 U/L   ALT 24 0 - 44 U/L   Alkaline Phosphatase 72 38 - 126 U/L   Total Bilirubin 0.6 0.3 - 1.2 mg/dL   Bilirubin, Direct 0.1 0.0 - 0.2 mg/dL   Indirect Bilirubin 0.5 0.3 - 0.9 mg/dL  CBG monitoring, ED  Result Value Ref Range   Glucose-Capillary 149 (H) 70 - 99  mg/dL  Troponin I (High Sensitivity)  Result Value Ref Range   Troponin I (High Sensitivity) <2 <18 ng/L  Troponin I (High Sensitivity)  Result Value Ref Range   Troponin I (High Sensitivity) <2 <18 ng/L   MR BRAIN WO CONTRAST  Result Date: 07/09/2021 CLINICAL DATA:  Initial evaluation for neuro deficit, stroke suspected. EXAM: MRI HEAD WITHOUT CONTRAST TECHNIQUE: Multiplanar, multiecho pulse sequences of the brain and surrounding structures were obtained without intravenous contrast. COMPARISON:  Prior head CT from 07/08/2021. FINDINGS: Brain: Cerebral volume within normal limits for age. No significant cerebral white matter disease. No evidence for acute or subacute infarct. Gray-white matter differentiation maintained. No areas of chronic cortical infarction. No acute or chronic intracranial blood products. No mass lesion, midline shift or mass effect. No hydrocephalus or extra-axial fluid collection. Partially empty sella noted. Midline structures intact and normally formed. Vascular: Major intracranial vascular flow voids are well maintained. Skull and upper cervical spine: Craniocervical junction normal. Bone marrow signal intensity  within normal limits. No scalp soft tissue abnormality. Sinuses/Orbits: Globes and orbital soft tissues within normal limits. Mild scattered mucosal thickening about the ethmoidal air cells. Paranasal sinuses are otherwise clear. No mastoid effusion. Other: None. IMPRESSION: Normal brain MRI. No acute intracranial infarct or other abnormality. Electronically Signed   By: Jeannine Boga M.D.   On: 07/09/2021 03:58   CT Head Wo Contrast  Result Date: 07/08/2021 CLINICAL DATA:  Recent syncopal episode with altered mental status EXAM: CT HEAD WITHOUT CONTRAST TECHNIQUE: Contiguous axial images were obtained from the base of the skull through the vertex without intravenous contrast. RADIATION DOSE REDUCTION: This exam was performed according to the departmental  dose-optimization program which includes automated exposure control, adjustment of the mA and/or kV according to patient size and/or use of iterative reconstruction technique. COMPARISON:  None Available. FINDINGS: Brain: No evidence of acute infarction, hemorrhage, hydrocephalus, extra-axial collection or mass lesion/mass effect. Vascular: No hyperdense vessel or unexpected calcification. Skull: Normal. Negative for fracture or focal lesion. Sinuses/Orbits: No acute finding. Other: None. IMPRESSION: No acute abnormality noted. Electronically Signed   By: Inez Catalina M.D.   On: 07/08/2021 21:46   DG Chest Port 1 View  Result Date: 07/08/2021 CLINICAL DATA:  Syncopal versus near syncopal episode. EXAM: PORTABLE CHEST 1 VIEW COMPARISON:  Portable chest 12/08/2018 FINDINGS: The cardiac size is normal. Mediastinal configuration is stable allowing for expiration with mild aortic tortuosity and atherosclerosis. The lungs are expiratory with obscuration of the bases. Visualized lungs are clear but a lower zonal infiltrate could be obscured. There is mild perihilar linear atelectasis. The sulci are sharp. No acute osseous findings. IMPRESSION: Expiratory study. The visualized lungs are clear of infiltrates but a lower zonal infiltrate would be obscured. No other evidence of acute chest process. Electronically Signed   By: Telford Nab M.D.   On: 07/08/2021 21:11     MRI negative.  Patient continues to feel well, denies any recurrent symptoms.  Speech is clear and goal oriented, no word finding difficulties.  VSS.  Appears stable for discharge.  Will refer to OP neurology for follow-up, ambulatory referral placed.  Can return here for any new/acute changes.   Larene Pickett, PA-C 07/09/21 0404    Maudie Flakes, MD 07/09/21 [phone]

## 2021-07-09 NOTE — Discharge Instructions (Signed)
I have placed referral for neurology.  They should be contacting you in the next few days to get appointment scheduled.  This may be slightly delayed due to holiday. Please follow-up with your primary care doctor in the interim. Return here for any new or acute changes--focal numbness, weakness, difficulty speaking, etc.

## 2021-07-16 ENCOUNTER — Other Ambulatory Visit: Payer: Self-pay | Admitting: Physician Assistant

## 2021-07-16 NOTE — Telephone Encounter (Signed)
Next Visit: 12/25/2021  Last Visit: 06/20/2021  Last Fill: 03/13/2021  DX: Idiopathic chronic gout of multiple sites without tophus   Current Dose per office note 06/20/2021: Allopurinol 300 mg 1 tablet by mouth daily   Labs: 07/08/2021 Glucose 128, Creat. 1.48, GFR 52   Okay to refill Allopurinol?

## 2021-07-24 DIAGNOSIS — Z6830 Body mass index (BMI) 30.0-30.9, adult: Secondary | ICD-10-CM | POA: Diagnosis not present

## 2021-07-24 DIAGNOSIS — I493 Ventricular premature depolarization: Secondary | ICD-10-CM | POA: Diagnosis not present

## 2021-07-24 DIAGNOSIS — R55 Syncope and collapse: Secondary | ICD-10-CM | POA: Diagnosis not present

## 2021-07-24 DIAGNOSIS — R413 Other amnesia: Secondary | ICD-10-CM | POA: Diagnosis not present

## 2021-07-25 DIAGNOSIS — G4733 Obstructive sleep apnea (adult) (pediatric): Secondary | ICD-10-CM | POA: Diagnosis not present

## 2021-08-02 ENCOUNTER — Ambulatory Visit: Payer: PPO | Admitting: Cardiovascular Disease

## 2021-08-02 ENCOUNTER — Encounter: Payer: Self-pay | Admitting: Cardiovascular Disease

## 2021-08-02 ENCOUNTER — Ambulatory Visit (INDEPENDENT_AMBULATORY_CARE_PROVIDER_SITE_OTHER): Payer: PPO

## 2021-08-02 VITALS — BP 102/56 | HR 74 | Ht 75.0 in | Wt 240.6 lb

## 2021-08-02 DIAGNOSIS — R55 Syncope and collapse: Secondary | ICD-10-CM

## 2021-08-02 DIAGNOSIS — G4733 Obstructive sleep apnea (adult) (pediatric): Secondary | ICD-10-CM

## 2021-08-02 DIAGNOSIS — Z8739 Personal history of other diseases of the musculoskeletal system and connective tissue: Secondary | ICD-10-CM

## 2021-08-02 DIAGNOSIS — I493 Ventricular premature depolarization: Secondary | ICD-10-CM

## 2021-08-02 DIAGNOSIS — E782 Mixed hyperlipidemia: Secondary | ICD-10-CM

## 2021-08-02 DIAGNOSIS — R7303 Prediabetes: Secondary | ICD-10-CM | POA: Diagnosis not present

## 2021-08-02 DIAGNOSIS — I2510 Atherosclerotic heart disease of native coronary artery without angina pectoris: Secondary | ICD-10-CM | POA: Diagnosis not present

## 2021-08-02 NOTE — Progress Notes (Unsigned)
Enrolled patient for a 7 day Zio XT monitor to be mailed to patients home.  

## 2021-08-02 NOTE — Patient Instructions (Signed)
Medication Instructions:  No changes *If you need a refill on your cardiac medications before your next appointment, please call your pharmacy*   Lab Work: None ordered If you have labs (blood work) drawn today and your tests are completely normal, you will receive your results only by: [place] (if you have MyChart) OR A paper copy in the mail If you have any lab test that is abnormal or we need to change your treatment, we will call you to review the results.  Follow-Up: At [facility], you and your health needs are our priority.  As part of our continuing mission to provide you with exceptional heart care, we have created designated Provider Care Teams.  These Care Teams include your primary Cardiologist (physician) and Advanced Practice Providers (APPs -  Physician Assistants and Nurse Practitioners) who all work together to provide you with the care you need, when you need it.  We recommend signing up for the patient portal called "MyChart".  Sign up information is provided on this After Visit Summary.  MyChart is used to connect with patients for Virtual Visits (Telemedicine).  Patients are able to view lab/test results, encounter notes, upcoming appointments, etc.  Non-urgent messages can be sent to your provider as well.   To learn more about what you can do with MyChart, go to [email or web address].    Your next appointment:   Follow up with Dr. Sallyanne Kuster in February 2024   [place]: Website: [email or web address]  DR. Sallyanne Kuster RECOMMENDS YOU PURCHASE  " Kardia" [employer] FROM THE  [employer]  APP PLAY STORE.  THE APP IS FREE , BUT THE  EQUIPMENT HAS A COST. IT ALLOWS YOU TO OBTAIN A RECORDING OF YOUR HEART RATE AND RHYTHM BY PROVIDING A SHORT STRIP THAT YOU CAN SHARE WITH YOUR PROVIDER.     [facility] - sending an EKG Download app and set up profile. Run EKG - by placing 1-2 fingers on the silver plates After EKG is complete -  Download PDF  - Skip password (if you apply a password the provider will need it to view the EKG) Click share button (square with upward arrow) in bottom left corner To send: choose MyChart (first time log into MyChart)  Pop up window about sending ECG Click continue Choose type of message Choose provider Type subject and message Click send (EKG should be attached)  - To send additional EKGs in one message click the paperclip image and bottom of page to attach.       Other Instructions ZIO XT- Long Term Monitor Instructions  Your physician has requested you wear a ZIO patch monitor for 7 days.  This is a single patch monitor. [employer] supplies one patch monitor per enrollment. Additional stickers are not available. Please do not apply patch if you will be having a Nuclear Stress Test,  Echocardiogram, Cardiac CT, MRI, or Chest Xray during the period you would be wearing the  monitor. The patch cannot be worn during these tests. You cannot remove and re-apply the  ZIO XT patch monitor.  Your ZIO patch monitor will be mailed 3 day USPS to your address on file. It may take 3-5 days  to receive your monitor after you have been enrolled.  Once you have received your monitor, please review the enclosed instructions. Your monitor  has already been registered assigning a specific monitor serial # to you.  Billing and Patient Assistance Program Information  We have supplied [employer] with any of  your insurance information on file for billing purposes. [employer] offers a sliding scale Patient Assistance Program for patients that do not have  insurance, or whose insurance does not completely cover the cost of the ZIO monitor.  You must apply for the Patient Assistance Program to qualify for this discounted rate.  To apply, please call [employer] at [phone], select option 4, select option 2, ask to apply for  Patient Assistance Program. Theodore Demark will ask your household income, and how many people   are in your household. They will quote your out-of-pocket cost based on that information.  [employer] will also be able to set up a 49-month interest-free payment plan if needed.  Applying the monitor   Shave hair from upper left chest.  Hold abrader disc by orange tab. Rub abrader in 40 strokes over the upper left chest as  indicated in your monitor instructions.  Clean area with 4 enclosed alcohol pads. Let dry.  Apply patch as indicated in monitor instructions. Patch will be placed under collarbone on left  side of chest with arrow pointing upward.  Rub patch adhesive wings for 2 minutes. Remove white label marked "1". Remove the white  label marked "2". Rub patch adhesive wings for 2 additional minutes.  While looking in a mirror, press and release button in center of patch. A small green light will  flash 3-4 times. This will be your only indicator that the monitor has been turned on.  Do not shower for the first 24 hours. You may shower after the first 24 hours.  Press the button if you feel a symptom. You will hear a small click. Record Date, Time and  Symptom in the Patient Logbook.  When you are ready to remove the patch, follow instructions on the last 2 pages of Patient  Logbook. Stick patch monitor onto the last page of Patient Logbook.  Place Patient Logbook in the blue and white box. Use locking tab on box and tape box closed  securely. The blue and white box has prepaid postage on it. Please place it in the mailbox as  soon as possible. Your physician should have your test results approximately 7 days after the  monitor has been mailed back to [facility]  Call [employer]at [phone] you have questions regarding  your ZIO XT patch monitor. Call them immediately if you see an orange light blinking on your  monitor.  If your monitor falls off in less than 4 days, contact our Monitor department at [phone]  If your monitor becomes loose or  falls off after 4 days call [employer] at [phone]  suggestions on securing your monitor

## 2021-08-02 NOTE — Progress Notes (Signed)
Cardiology Office Note    Date:  08/02/2021   ID:  Peter Burch, DOB 10-22-1955, MRN [id number]  PCP:  Sharilyn Sites, MD  Cardiologist:   Sanda Klein, MD   Chief complaint: palpitations, near-syncope   History of Present Illness:  Peter Burch is a 66 y.o. male with known mild coronary artery disease (40% proximal LAD calcified lesion by cath 2008), without symptoms of angina pectoris and with a low risk nuclear stress test in 2011. He is known to have an inferior wall defect on nuclear stress test that is a false positive abnormality (this actually led to the heart cath in 2008).  Coronary CT in January 2021 showed no evidence of meaningful blockages, but his calcium score was 1351 (95th percentile) and he had a moderate proximal LAD stenosis estimated at about 50%, not significant by FFR.  He has hyperlipidemia but has been unable to tolerate numerous statins. He did take Zetia without side effects, but this was ineffective so he is currently receiving Repatha.  He has a previous history of episodes of orthostatic dizziness and palpitations.  In the past, his palpitations have correlated with PVCs on ECG.  He has never experienced full-blown syncope.  He continues to have a lot of problems with constipation.  He is taking Linzess.  Sometimes he has very large and frequent bowel movements and feels exhausted afterwards.  This happened on July 2.  Later that day he went to an outside celebration at church and he felt very unwell.  He felt weak.  He found it very difficult to focus on other people speech and thinks that he may have briefly lost consciousness while sitting on a bench and talking to his nephew.  He did not lose postural control.  On the way home in the car he felt very poorly and had difficulty speaking.  His wife noticed that he was having more skipped beats than usual.  They called EMS.  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  He was taken to the emergency room where he had CT of the head, labs and eventually an MRI of the head.  There were no abnormalities on imaging studies.  His creatinine was elevated at 1.48.  He noticed that his first urine was very dark.  He reports feeling much better after he received intravenous fluids in preparation for the scans.  He has sleep apnea and started CPAP last December.  He has not been exercising regularly.  He had a recent gout attack.  Recent lipid profile performed on January 4 showed a total cholesterol 138, HDL 45, LDL 64 and borderline elevated triglycerides at 172.  His recent hemoglobin A1c was also mildly elevated at 5.8%.    Past Medical History:  Diagnosis Date   CAD (coronary artery disease)    Constipation    Gout    Hypercholesteremia    Sinus bradycardia     Past Surgical History:  Procedure Laterality Date   CARDIAC CATHETERIZATION  05/16/2006   40% LAD lesion   COLONOSCOPY N/A 01/19/2015   Procedure: COLONOSCOPY;  Surgeon: Rogene Houston, MD;  Location: [facility];  Service: Endoscopy;  Laterality: N/A;  [place]  07/31/2009   mod to severe defect due to infarct/scar w/mild perinfarct ischemia   US ECHOCARDIOGRAPHY  07/31/2009   impaired LV relaxation  Current Medications: Outpatient Medications Prior to Visit  Medication Sig Dispense Refill   allopurinol (ZYLOPRIM) 300 MG tablet TAKE (1) TABLET BY MOUTH ONCE DAILY. 90 tablet 0   ALPRAZolam (XANAX) 0.5 MG tablet Take 0.25-0.5 tablets by mouth daily as needed for anxiety.      esomeprazole (NEXIUM) 20 MG capsule Take 20 mg by mouth daily as needed (for GERD).     ezetimibe (ZETIA) 10 MG tablet TAKE ONE TABLET BY MOUTH ONCE DAILY. 90 tablet 3   LINZESS 72 MCG capsule Take 72 mcg by mouth as needed.     REPATHA SURECLICK 540 MG/ML SOAJ INJECT '140MG'$  INTO THE SKIN EVERY 14 DAYS. 2 mL 11   fluticasone (FLONASE) 50  MCG/ACT nasal spray Place 1 spray into the nose as needed for allergies.  (Patient not taking: Reported on 08/02/2021)     MITIGARE 0.6 MG CAPS Take 0.6 mg by mouth daily. (Patient not taking: Reported on 08/02/2021) 30 capsule 1   No facility-administered medications prior to visit.     Allergies:   Statins   Social History   Socioeconomic History   Marital status: Married    Spouse name: Not on file   Number of children: Not on file   Years of education: Not on file   Highest education level: Not on file  Occupational History   Not on file  Tobacco Use   Smoking status: Never    Passive exposure: Past   Smokeless tobacco: Never  Vaping Use   Vaping Use: Never used  Substance and Sexual Activity   Alcohol use: Not Currently   Drug use: No   Sexual activity: Not on file  Other Topics Concern   Not on file  Social History Narrative   Not on file   Social Determinants of Health   Financial Resource Strain: Not on file  Food Insecurity: Not on file  Transportation Needs: Not on file  Physical Activity: Not on file  Stress: Not on file  Social Connections: Not on file     Family History:  The patient's family history includes Cancer in his father; Heart attack in his mother.   ROS:   Please see the history of present illness.    ROS All other systems are reviewed and are negative.   PHYSICAL EXAM:   VS:  BP (!) 102/56 (BP Location: Left Arm, Patient Position: Sitting, Cuff Size: Large)   Pulse 74   Ht '6\' 3"'$  (1.905 m)   Wt 240 lb 9.6 oz (109.1 kg)   SpO2 96%   BMI 30.07 kg/m       General: Alert, oriented x3, no distress, borderline obese Head: no evidence of trauma, PERRL, EOMI, no exophtalmos or lid lag, no myxedema, no xanthelasma; normal ears, nose and oropharynx Neck: normal jugular venous pulsations and no hepatojugular reflux; brisk carotid pulses without delay and no carotid bruits Chest: clear to auscultation, no signs of consolidation by percussion or  palpation, normal fremitus, symmetrical and full respiratory excursions Cardiovascular: normal position and quality of the apical impulse, regular rhythm, with rare ectopic beats, normal first and second heart sounds, no murmurs, rubs or gallops Abdomen: no tenderness or distention, no masses by palpation, no abnormal pulsatility or arterial bruits, normal bowel sounds, no hepatosplenomegaly Extremities: no clubbing, cyanosis or edema; 2+ radial, ulnar and brachial pulses bilaterally; 2+ right femoral, posterior tibial and dorsalis pedis pulses; 2+ left femoral, posterior tibial and dorsalis pedis pulses; no subclavian or femoral bruits Neurological: grossly  nonfocal Psych: Normal mood and affect    Wt Readings from Last 3 Encounters:  08/02/21 240 lb 9.6 oz (109.1 kg)  07/08/21 244 lb 9.6 oz (111 kg)  06/20/21 244 lb 9.6 oz (110.9 kg)      Studies/Labs Reviewed:   EKG:  EKG is not ordered today.  The tracing from the ER visit 07/08/2021 showed normal sinus rhythm with a single PVC, otherwise normal.  Recent Labs: 07/08/2021: ALT 24; BUN 15; Creatinine, Ser 1.48; Hemoglobin 13.6; Platelets 205; Potassium 4.0; Sodium 137   Lipid Panel    Component Value Date/Time   CHOL 101 04/28/2020 1035   TRIG 171 (H) 04/28/2020 1035   HDL 47 04/28/2020 1035   CHOLHDL 2.1 04/28/2020 1035   CHOLHDL 4.8 01/20/2019 1128   VLDL 36 01/20/2019 1128   LDLCALC 26 04/28/2020 1035      ASSESSMENT:    1. Near syncope   2. PVCs (premature ventricular contractions)   3. Atherosclerosis of native coronary artery of native heart without angina pectoris   4. Mixed hyperlipidemia   5. Prediabetes   6. OSA (obstructive sleep apnea)   7. History of gout      PLAN:  In order of problems listed above:  Near syncope: I think this has all the hallmarks of hypotension, although a low blood pressure was not actually documented.  He was probably dehydrated after having multiple bowel movements and spending  the afternoon outside in the heat.  He improved with IV fluids.  His creatinine was elevated above his baseline.   PVCs: These appear to be the cause of his palpitations.  Had more frequent irregular beats during his episode of near syncope, but hard to say that these were causal.  We will check an event monitor.  Encouraged him to get some type of personal monitoring device such as a smart watch or Kardia. CAD: No symptoms of angina pectoris.  Cardiac enzymes were normal following his recent events.  ECG shows no ischemic changes.  He has a very high plaque burden, but no significant flow-limiting stenoses.  He does not have angina pectoris.  To continue aggressive lipid-lowering therapy and encouraged more physical activity.   History of false positive nuclear stress test in the past.  Coronary CTA shows high plaque burden and a proximal LAD moderate lesion, but no flow-limiting stenoses angiographically or by CT FFR.   HLP: Excellent LDL cholesterol level on Repatha, which he will continue. PreDM: Continues to have periodic elevation in hemoglobin A1c in the prediabetes range, and this seems to correlate with his periods of more sedentary lifestyle. OSA: Recently prescribed CPAP, still adjusting to therapy. Constipation: Has become one of his most pressing complaints over the last year or so. Hyperuricemia/history of gout: Recent gout attack has subsided.   Medication Adjustments/Labs and Tests Ordered: Current medicines are reviewed at length with the patient today.  Concerns regarding medicines are outlined above.  Medication changes, Labs and Tests ordered today are listed in the Patient Instructions below. Patient Instructions  Medication Instructions:  No changes *If you need a refill on your cardiac medications before your next appointment, please call your pharmacy*   Lab Work: None ordered If you have labs (blood work) drawn today and your tests are completely normal, you will receive  your results only by: [place] (if you have MyChart) OR A paper copy in the mail If you have any lab test that is abnormal or we need to change your treatment,  we will call you to review the results.  Follow-Up: At [facility], you and your health needs are our priority.  As part of our continuing mission to provide you with exceptional heart care, we have created designated Provider Care Teams.  These Care Teams include your primary Cardiologist (physician) and Advanced Practice Providers (APPs -  Physician Assistants and Nurse Practitioners) who all work together to provide you with the care you need, when you need it.  We recommend signing up for the patient portal called "MyChart".  Sign up information is provided on this After Visit Summary.  MyChart is used to connect with patients for Virtual Visits (Telemedicine).  Patients are able to view lab/test results, encounter notes, upcoming appointments, etc.  Non-urgent messages can be sent to your provider as well.   To learn more about what you can do with MyChart, go to [email or web address].    Your next appointment:   Follow up with Dr. Sallyanne Kuster in February 2024   [place]: Website: [email or web address]  DR. Sallyanne Kuster RECOMMENDS YOU PURCHASE  " Kardia" [employer] FROM THE  [employer]  APP PLAY STORE.  THE APP IS FREE , BUT THE  EQUIPMENT HAS A COST. IT ALLOWS YOU TO OBTAIN A RECORDING OF YOUR HEART RATE AND RHYTHM BY PROVIDING A SHORT STRIP THAT YOU CAN SHARE WITH YOUR PROVIDER.     [facility] - sending an EKG Download app and set up profile. Run EKG - by placing 1-2 fingers on the silver plates After EKG is complete - Download PDF  - Skip password (if you apply a password the provider will need it to view the EKG) Click share button (square with upward arrow) in bottom left corner To send: choose MyChart (first time log into MyChart)  Pop up window about sending ECG Click  continue Choose type of message Choose provider Type subject and message Click send (EKG should be attached)  - To send additional EKGs in one message click the paperclip image and bottom of page to attach.       Other Instructions ZIO XT- Long Term Monitor Instructions  Your physician has requested you wear a ZIO patch monitor for 7 days.  This is a single patch monitor. [employer] supplies one patch monitor per enrollment. Additional stickers are not available. Please do not apply patch if you will be having a Nuclear Stress Test,  Echocardiogram, Cardiac CT, MRI, or Chest Xray during the period you would be wearing the  monitor. The patch cannot be worn during these tests. You cannot remove and re-apply the  ZIO XT patch monitor.  Your ZIO patch monitor will be mailed 3 day USPS to your address on file. It may take 3-5 days  to receive your monitor after you have been enrolled.  Once you have received your monitor, please review the enclosed instructions. Your monitor  has already been registered assigning a specific monitor serial # to you.  Billing and Patient Assistance Program Information  We have supplied [employer] with any of your insurance information on file for billing purposes. [employer] offers a sliding scale Patient Assistance Program for patients that do not have  insurance, or whose insurance does not completely cover the cost of the ZIO monitor.  You must apply for the Patient Assistance Program to qualify for this discounted rate.  To apply, please call [employer] at [phone], select option 4, select option 2, ask to apply for  Patient Assistance Program. Theodore Demark will ask your  household income, and how many people  are in your household. They will quote your out-of-pocket cost based on that information.  [employer] will also be able to set up a 80-month interest-free payment plan if needed.  Applying the monitor   Shave hair from upper left chest.  Hold abrader disc by  orange tab. Rub abrader in 40 strokes over the upper left chest as  indicated in your monitor instructions.  Clean area with 4 enclosed alcohol pads. Let dry.  Apply patch as indicated in monitor instructions. Patch will be placed under collarbone on left  side of chest with arrow pointing upward.  Rub patch adhesive wings for 2 minutes. Remove white label marked "1". Remove the white  label marked "2". Rub patch adhesive wings for 2 additional minutes.  While looking in a mirror, press and release button in center of patch. A small green light will  flash 3-4 times. This will be your only indicator that the monitor has been turned on.  Do not shower for the first 24 hours. You may shower after the first 24 hours.  Press the button if you feel a symptom. You will hear a small click. Record Date, Time and  Symptom in the Patient Logbook.  When you are ready to remove the patch, follow instructions on the last 2 pages of Patient  Logbook. Stick patch monitor onto the last page of Patient Logbook.  Place Patient Logbook in the blue and white box. Use locking tab on box and tape box closed  securely. The blue and white box has prepaid postage on it. Please place it in the mailbox as  soon as possible. Your physician should have your test results approximately 7 days after the  monitor has been [facility]  Call [employer] [phone] you have questions regarding  your ZIO XT patch monitor. Call them immediately if you see an orange light blinking on your  monitor.  If your monitor falls off in less than 4 days, contact our Monitor department at [phone]  If your monitor becomes loose or falls off after 4 days call [employer] at [phone]  suggestions on securing your monitor      Signed, MSanda Klein MD  08/02/2021 1:12 PM    [facility] [address] [place]  [id number]: [phone] Fax:  [phone]

## 2021-08-04 DIAGNOSIS — R55 Syncope and collapse: Secondary | ICD-10-CM

## 2021-08-09 ENCOUNTER — Ambulatory Visit: Payer: PPO | Admitting: Neurology

## 2021-08-09 ENCOUNTER — Encounter: Payer: Self-pay | Admitting: Cardiovascular Disease

## 2021-08-09 ENCOUNTER — Encounter: Payer: Self-pay | Admitting: Neurology

## 2021-08-09 VITALS — BP 125/85 | HR 83 | Ht 75.0 in | Wt 241.5 lb

## 2021-08-09 DIAGNOSIS — K5904 Chronic idiopathic constipation: Secondary | ICD-10-CM | POA: Insufficient documentation

## 2021-08-09 DIAGNOSIS — Z860100 Personal history of colon polyps, unspecified: Secondary | ICD-10-CM | POA: Insufficient documentation

## 2021-08-09 DIAGNOSIS — R55 Syncope and collapse: Secondary | ICD-10-CM | POA: Diagnosis not present

## 2021-08-09 DIAGNOSIS — E860 Dehydration: Secondary | ICD-10-CM

## 2021-08-09 NOTE — Progress Notes (Signed)
[facility]  PATIENT: Peter Burch DOB: 1955/04/03  REQUESTING CLINICIAN: Larene Pickett, PA-C HISTORY FROM: Patient and wife  REASON FOR VISIT: Near syncope    HISTORICAL  CHIEF COMPLAINT:  Chief Complaint  Patient presents with   New Patient (Initial Visit)    Rm 13. Accompanied by wife, Kennyth Lose. NP internal referral for altered mental status.    HISTORY OF PRESENT ILLNESS:  This is a 66 year old gentleman past medical history of anxiety, gout, GERD, and chronic constipation on Linzess who is presenting after being seen in the hospital for 1 episode of near syncope, and difficulty speaking, and work finding difficulty which was worsen by anxiety. Patient does report a history of chronic constipation and reports 3 days prior to the event because of the Linzess he was having loose stool.  The day of the event he was outside, did not drink enough fluid and in the evening while talking to friends and family he noted feeling confused, feeling like he is going to pass out and did not feel well.  Wife took him home during the way home he was not feeling his normal self.  EMS was called and he was taken to the hospital.  He reported in the hospital he was given IV fluid and he felt much better right after.  He did have a MRI of the brain which was negative for any abnormality.  He has follow-up with PCP and cardiology and currently is wearing a cardiac monitor.  He also reported that since seen his PCP and cardiology he has increase his fluid intake, and currently taking the Linzess every day and he is more regular now.  Denies any abnormal movement, or seizure like activity.  He denies being diagnosed with orthostatic hypotension but reports in the past he will have episode of dizziness upon standing   OTHER MEDICAL CONDITIONS: Anxiety, Gout, GERD, Hyperlipidemia, Chronic constipation   REVIEW OF SYSTEMS: Full 14 system review of systems performed and negative with  exception of: As noted in the HPI  ALLERGIES: Allergies  Allergen Reactions   Statins     Myalgias Crestor,vytorin,livalo    HOME MEDICATIONS: Outpatient Medications Prior to Visit  Medication Sig Dispense Refill   allopurinol (ZYLOPRIM) 300 MG tablet TAKE (1) TABLET BY MOUTH ONCE DAILY. 90 tablet 0   ALPRAZolam (XANAX) 0.5 MG tablet Take 0.25-0.5 tablets by mouth daily as needed for anxiety.      esomeprazole (NEXIUM) 20 MG capsule Take 20 mg by mouth daily as needed (for GERD).     ezetimibe (ZETIA) 10 MG tablet TAKE ONE TABLET BY MOUTH ONCE DAILY. 90 tablet 3   fluticasone (FLONASE) 50 MCG/ACT nasal spray Place 1 spray into the nose as needed for allergies.     LINZESS 72 MCG capsule Take 72 mcg by mouth daily.     MITIGARE 0.6 MG CAPS Take 0.6 mg by mouth daily. 30 capsule 1   REPATHA SURECLICK 076 MG/ML SOAJ INJECT '140MG'$  INTO THE SKIN EVERY 14 DAYS. 2 mL 11   No facility-administered medications prior to visit.    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   US ECHOCARDIOGRAPHY  07/31/2009   impaired LV relaxation    FAMILY HISTORY: Family History  Problem Relation Age of Onset   Heart attack Mother    Cancer Father        lung    SOCIAL HISTORY: Social History   Socioeconomic History   Marital status: Married    Spouse name: Not on file   Number of children: Not on file   Years of education: Not on file   Highest education level: Not on file   Occupational History   Not on file  Tobacco Use   Smoking status: Never    Passive exposure: Past   Smokeless tobacco: Never  Vaping Use   Vaping Use: Never used  Substance and Sexual Activity   Alcohol use: Not Currently   Drug use: No   Sexual activity: Not on file  Other Topics Concern   Not on file  Social History Narrative   Not on file   Social Determinants of Health   Financial Resource Strain: Not on file  Food Insecurity: Not on file  Transportation Needs: Not on file  Physical Activity: Not on file  Stress: Not on file  Social Connections: Not on file  Intimate Partner Violence: Not on file    PHYSICAL EXAM  GENERAL EXAM/CONSTITUTIONAL: Vitals:  Vitals:   08/09/21 0931 08/09/21 1019  BP: 120/83 125/85  Pulse: 71 83  Weight: 241 lb 8 oz (109.5 kg)   Height: '6\' 3"'$  (1.905 m)    Body mass index is 30.19 kg/m. Wt Readings from Last 3 Encounters:  08/09/21 241 lb 8 oz (109.5 kg)  08/02/21 240 lb 9.6 oz (109.1 kg)  07/08/21 244 lb 9.6 oz (111 kg)   Patient is in no distress; well developed, nourished and groomed; neck is supple  EYES: Pupils round and reactive to light, Visual fields full to confrontation, Extraocular movements intacts,   MUSCULOSKELETAL: Gait, strength, tone, movements noted in Neurologic exam below  NEUROLOGIC: MENTAL STATUS:      No data to display         awake, alert, oriented to person, place and time recent and remote memory intact normal attention and concentration language fluent, comprehension intact, naming intact fund of knowledge appropriate  CRANIAL NERVE:  2nd, 3rd, 4th, 6th - pupils equal and reactive to light, visual fields full to confrontation, extraocular muscles intact, no nystagmus 5th - facial sensation symmetric 7th - facial strength symmetric 8th - hearing intact 9th - palate elevates symmetrically, uvula midline 11th - shoulder shrug symmetric 12th - tongue protrusion midline  MOTOR:  normal  bulk and tone, full strength in the BUE, BLE  SENSORY:  normal and symmetric to light touch, pinprick, temperature, vibration  COORDINATION:  finger-nose-finger, fine finger movements normal  REFLEXES:  deep tendon reflexes present and symmetric  GAIT/STATION:  normal     DIAGNOSTIC DATA (LABS, IMAGING, TESTING) - I reviewed patient records, labs, notes, testing and imaging myself where available.  Lab Results  Component Value Date   WBC 8.9 07/08/2021   HGB 13.6 07/08/2021   HCT 42.1 07/08/2021   MCV 83.9 07/08/2021   PLT 205 07/08/2021      Component Value Date/Time   NA 137 07/08/2021 2025  NA 142 01/20/2020 0929   K 4.0 07/08/2021 2025   CL 104 07/08/2021 2025   CO2 24 07/08/2021 2025   GLUCOSE 128 (H) 07/08/2021 2025   BUN 15 07/08/2021 2025   BUN 12 01/20/2020 0929   CREATININE 1.48 (H) 07/08/2021 2025   CREATININE 1.28 06/20/2021 1311   CALCIUM 9.1 07/08/2021 2025   PROT 7.1 07/08/2021 2041   PROT 6.5 01/20/2020 0929   ALBUMIN 4.2 07/08/2021 2041   ALBUMIN 4.6 01/20/2020 0929   AST 20 07/08/2021 2041   ALT 24 07/08/2021 2041   ALKPHOS 72 07/08/2021 2041   BILITOT 0.6 07/08/2021 2041   BILITOT 0.4 01/20/2020 0929   GFRNONAA 52 (L) 07/08/2021 2025   GFRNONAA 61 06/22/2019 0822   GFRAA 69 01/20/2020 0929   GFRAA 71 06/22/2019 0822   Lab Results  Component Value Date   CHOL 101 04/28/2020   HDL 47 04/28/2020   LDLCALC 26 04/28/2020   TRIG 171 (H) 04/28/2020   CHOLHDL 2.1 04/28/2020   Lab Results  Component Value Date   HGBA1C 5.6 11/12/2017   No results found for: "VITAMINB12" No results found for: "TSH"  MRI Brain 07/09/21 Normal brain MRI. No acute intracranial infarct or other abnormality.    ASSESSMENT AND PLAN  66 y.o. year old male with history of anxiety, gout, gerd, chronic constipation who is presenting after an episode of near syncope and word finding difficulty. He was having lose stools for the past 3 days prior to the event  and on the day of the event, he was outside and didn't drink enough fluid. MRI Brain negative for any acute intracranial abnormalities. Initial labs show a elevated creatinine of 1.48 and an urine spec gravity of 1014. He felt much better after rehydration. At the moment, we encourage fluid hydration, and patient to continue following up with PCP and Cardiologist. Return as needed.    1. Near syncope   2. Dehydration     Patient Instructions  Continue current medications  Drink plenty of fluids  Follow up with PCP and Cardiology  Return as needed   No orders of the defined types were placed in this encounter.   No orders of the defined types were placed in this encounter.   Return if symptoms worsen or fail to improve.  I have spent a total of 45 minutes dedicated to this patient today, preparing to see patient, performing a medically appropriate examination and evaluation, ordering tests and/or medications and procedures, and counseling and educating the patient/family/caregiver; independently interpreting result and communicating results to the family/patient/caregiver; and documenting clinical information in the electronic medical record.   Alric Ran, MD 08/09/2021, 1:26 PM  [facility] [address] [place], [place] [address] [phone]

## 2021-08-09 NOTE — Patient Instructions (Signed)
Continue current medications  Drink plenty of fluids  Follow up with PCP and Cardiology  Return as needed

## 2021-08-10 ENCOUNTER — Encounter: Payer: Self-pay | Admitting: Cardiovascular Disease

## 2021-08-17 DIAGNOSIS — R55 Syncope and collapse: Secondary | ICD-10-CM | POA: Diagnosis not present

## 2021-08-25 DIAGNOSIS — G4733 Obstructive sleep apnea (adult) (pediatric): Secondary | ICD-10-CM | POA: Diagnosis not present

## 2021-09-02 ENCOUNTER — Emergency Department (HOSPITAL_COMMUNITY): Payer: PPO

## 2021-09-02 ENCOUNTER — Other Ambulatory Visit: Payer: Self-pay

## 2021-09-02 ENCOUNTER — Emergency Department (HOSPITAL_COMMUNITY)
Admission: EM | Admit: 2021-09-02 | Discharge: 2021-09-02 | Disposition: A | Discharge status: Home / Self Care | Payer: PPO | Attending: Emergency Medicine | Admitting: Emergency Medicine

## 2021-09-02 ENCOUNTER — Encounter (HOSPITAL_COMMUNITY): Payer: Self-pay | Admitting: Emergency Medicine

## 2021-09-02 DIAGNOSIS — R410 Disorientation, unspecified: Secondary | ICD-10-CM

## 2021-09-02 DIAGNOSIS — Z79899 Other long term (current) drug therapy: Secondary | ICD-10-CM | POA: Diagnosis not present

## 2021-09-02 DIAGNOSIS — R9431 Abnormal electrocardiogram [ECG] [EKG]: Secondary | ICD-10-CM | POA: Diagnosis not present

## 2021-09-02 DIAGNOSIS — I639 Cerebral infarction, unspecified: Secondary | ICD-10-CM | POA: Diagnosis not present

## 2021-09-02 DIAGNOSIS — R001 Bradycardia, unspecified: Secondary | ICD-10-CM | POA: Diagnosis not present

## 2021-09-02 DIAGNOSIS — R4182 Altered mental status, unspecified: Secondary | ICD-10-CM | POA: Diagnosis not present

## 2021-09-02 DIAGNOSIS — R531 Weakness: Secondary | ICD-10-CM | POA: Insufficient documentation

## 2021-09-02 LAB — URINALYSIS, ROUTINE W REFLEX MICROSCOPIC
Bacteria, UA: NONE SEEN
Bilirubin Urine: NEGATIVE
Glucose, UA: NEGATIVE mg/dL
Ketones, ur: NEGATIVE mg/dL
Leukocytes,Ua: NEGATIVE
Nitrite: NEGATIVE
Protein, ur: NEGATIVE mg/dL
Specific Gravity, Urine: 1.015 (ref 1.005–1.030)
pH: 5 (ref 5.0–8.0)

## 2021-09-02 LAB — CBC
HCT: 43.9 % (ref 39.0–52.0)
Hemoglobin: 14 g/dL (ref 13.0–17.0)
MCH: 27.5 pg (ref 26.0–34.0)
MCHC: 31.9 g/dL (ref 30.0–36.0)
MCV: 86.2 fL (ref 80.0–100.0)
Platelets: 191 10*3/uL (ref 150–400)
RBC: 5.09 MIL/uL (ref 4.22–5.81)
RDW: 15.9 % — ABNORMAL HIGH (ref 11.5–15.5)
WBC: 8.1 10*3/uL (ref 4.0–10.5)
nRBC: 0 % (ref 0.0–0.2)

## 2021-09-02 LAB — DIFFERENTIAL
Abs Immature Granulocytes: 0.04 10*3/uL (ref 0.00–0.07)
Basophils Absolute: 0.1 10*3/uL (ref 0.0–0.1)
Basophils Relative: 1 %
Eosinophils Absolute: 0.6 10*3/uL — ABNORMAL HIGH (ref 0.0–0.5)
Eosinophils Relative: 8 %
Immature Granulocytes: 1 %
Lymphocytes Relative: 15 %
Lymphs Abs: 1.2 10*3/uL (ref 0.7–4.0)
Monocytes Absolute: 0.7 10*3/uL (ref 0.1–1.0)
Monocytes Relative: 8 %
Neutro Abs: 5.5 10*3/uL (ref 1.7–7.7)
Neutrophils Relative %: 67 %

## 2021-09-02 LAB — I-STAT CHEM 8, ED
BUN: 16 mg/dL (ref 8–23)
Calcium, Ion: 1.24 mmol/L (ref 1.15–1.40)
Chloride: 104 mmol/L (ref 98–111)
Creatinine, Ser: 1.3 mg/dL — ABNORMAL HIGH (ref 0.61–1.24)
Glucose, Bld: 134 mg/dL — ABNORMAL HIGH (ref 70–99)
HCT: 44 % (ref 39.0–52.0)
Hemoglobin: 15 g/dL (ref 13.0–17.0)
Potassium: 3.8 mmol/L (ref 3.5–5.1)
Sodium: 142 mmol/L (ref 135–145)
TCO2: 24 mmol/L (ref 22–32)

## 2021-09-02 LAB — COMPREHENSIVE METABOLIC PANEL
ALT: 21 U/L (ref 0–44)
AST: 18 U/L (ref 15–41)
Albumin: 4.2 g/dL (ref 3.5–5.0)
Alkaline Phosphatase: 81 U/L (ref 38–126)
Anion gap: 10 (ref 5–15)
BUN: 16 mg/dL (ref 8–23)
CO2: 25 mmol/L (ref 22–32)
Calcium: 9.3 mg/dL (ref 8.9–10.3)
Chloride: 106 mmol/L (ref 98–111)
Creatinine, Ser: 1.18 mg/dL (ref 0.61–1.24)
GFR, Estimated: >60 mL/min (ref >60)
Glucose, Bld: 142 mg/dL — ABNORMAL HIGH (ref 70–99)
Potassium: 3.9 mmol/L (ref 3.5–5.1)
Sodium: 141 mmol/L (ref 135–145)
Total Bilirubin: 0.8 mg/dL (ref 0.3–1.2)
Total Protein: 6.8 g/dL (ref 6.5–8.1)

## 2021-09-02 LAB — APTT: aPTT: 25 seconds (ref 24–36)

## 2021-09-02 LAB — RAPID URINE DRUG SCREEN, HOSP PERFORMED
Amphetamines: NOT DETECTED
Barbiturates: NOT DETECTED
Benzodiazepines: NOT DETECTED
Cocaine: NOT DETECTED
Opiates: NOT DETECTED
Tetrahydrocannabinol: POSITIVE — AB

## 2021-09-02 LAB — PROTIME-INR
INR: 0.9 (ref 0.8–1.2)
Prothrombin Time: 12.3 seconds (ref 11.4–15.2)

## 2021-09-02 LAB — ETHANOL: Alcohol, Ethyl (B): <10 mg/dL (ref <10)

## 2021-09-02 LAB — CBG MONITORING, ED: Glucose-Capillary: 154 mg/dL — ABNORMAL HIGH (ref 70–99)

## 2021-09-02 MED ORDER — LORAZEPAM 2 MG/ML IJ SOLN
0.5000 mg | Freq: Once | INTRAMUSCULAR | Status: AC | PRN
  Administered 2021-09-02: 0.5 mg via INTRAVENOUS
  Filled 2021-09-02: qty 1

## 2021-09-02 MED ORDER — LACTATED RINGERS IV BOLUS
1000.0000 mL | Freq: Once | INTRAVENOUS | Status: AC
  Administered 2021-09-02: 1000 mL via INTRAVENOUS

## 2021-09-02 NOTE — ED Notes (Signed)
EDP at the patients side.

## 2021-09-02 NOTE — ED Notes (Signed)
Nurse asked pt if he smoked marijuana either with a vape or naturally and pt said never .

## 2021-09-02 NOTE — ED Notes (Addendum)
Pt able to give health history to nurse. Pt able to stand and use urinal without any help. Pt refused CT d/t anxiety.

## 2021-09-02 NOTE — ED Provider Notes (Signed)
[facility] EMERGENCY DEPARTMENT Provider Note   CSN: [id number] Arrival date & time: 09/02/21  1612     History  Chief Complaint  Patient presents with   Weakness    Peter Burch is a 66 y.o. male.  HPI 66 year old male presents with acute time disorientation.  This occurred while he was on the couch at around 3:50 PM today.  He states that he is having trouble getting his bearings on time.  He feels like he has been in the triage room for an hour though notes only been a few minutes.  He feels like he has been here for an entire day though looking at his watch he understands is only been about an hour.  No aphasia or dysarthria noted by wife.  He has no facial droop.  No focal weakness or numbness.  He states a very similar presentation happened when he was dehydrated and was here on 7/2.  He was given a bolus of fluids and felt a lot better and his symptoms resolved prior to the CT imaging.  At that point he had a negative MRI as well.  He had a negative outpatient work-up.  He is on Linzess and had multiple stools yesterday though none today.  He states he has been trying to keep up with water.  Home Medications Prior to Admission medications   Medication Sig Start Date End Date Taking? Authorizing Provider  allopurinol (ZYLOPRIM) 300 MG tablet TAKE (1) TABLET BY MOUTH ONCE DAILY. 07/16/21   Ofilia Neas, PA-C  ALPRAZolam Duanne Moron) 0.5 MG tablet Take 0.25-0.5 tablets by mouth daily as needed for anxiety.  05/15/12   [provider]  esomeprazole (NEXIUM) 20 MG capsule Take 20 mg by mouth daily as needed (for GERD).    [provider]  ezetimibe (ZETIA) 10 MG tablet TAKE ONE TABLET BY MOUTH ONCE DAILY. 03/13/21   Croitoru, Mihai, MD  fluticasone (FLONASE) 50 MCG/ACT nasal spray Place 1 spray into the nose as needed for allergies. 05/15/12   [provider]  LINZESS 72 MCG capsule Take 72 mcg by mouth daily. 06/04/21   [provider]  MITIGARE 0.6 MG  CAPS Take 0.6 mg by mouth daily. 07/01/17   Ofilia Neas, PA-C  REPATHA SURECLICK 409 MG/ML SOAJ INJECT '140MG'$  INTO THE SKIN EVERY 14 DAYS. 06/07/21   Croitoru, Dani Gobble, MD      Allergies    Statins    Review of Systems   Review of Systems  Gastrointestinal:  Negative for vomiting.  Neurological:  Negative for speech difficulty, weakness and numbness.  Psychiatric/Behavioral:  Positive for confusion.     Physical Exam Updated Vital Signs BP 133/81   Pulse 68   Temp 98.3 F (36.8 C) (Oral)   Resp 17   Ht '6\' 3"'$  (1.905 m)   Wt 109.5 kg   SpO2 96%   BMI 30.17 kg/m  Physical Exam Vitals and nursing note reviewed.  Constitutional:      General: He is not in acute distress.    Appearance: He is well-developed. He is not ill-appearing or diaphoretic.  HENT:     Head: Normocephalic and atraumatic.  Eyes:     Extraocular Movements: Extraocular movements intact.     Pupils: Pupils are equal, round, and reactive to light.  Cardiovascular:     Rate and Rhythm: Normal rate and regular rhythm.     Heart sounds: Normal heart sounds.  Pulmonary:     Effort: Pulmonary effort  is normal.     Breath sounds: Normal breath sounds.  Abdominal:     Palpations: Abdomen is soft.     Tenderness: There is no abdominal tenderness.  Skin:    General: Skin is warm and dry.  Neurological:     Mental Status: He is alert.     Comments: CN 3-12 grossly intact. 5/5 strength in all 4 extremities. Grossly normal sensation. Normal finger to nose. Able to identify objects such as pen and watch. Alert and oriented to self, time, date, situation, location     ED Results / Procedures / Treatments   Labs (all labs ordered are listed, but only abnormal results are displayed) Labs Reviewed  CBC - Abnormal; Notable for the following components:      Result Value   RDW 15.9 (*)    All other components within normal limits  DIFFERENTIAL - Abnormal; Notable for the following components:   Eosinophils Absolute  0.6 (*)    All other components within normal limits  COMPREHENSIVE METABOLIC PANEL - Abnormal; Notable for the following components:   Glucose, Bld 142 (*)    All other components within normal limits  RAPID URINE DRUG SCREEN, HOSP PERFORMED - Abnormal; Notable for the following components:   Tetrahydrocannabinol POSITIVE (*)    All other components within normal limits  URINALYSIS, ROUTINE W REFLEX MICROSCOPIC - Abnormal; Notable for the following components:   Hgb urine dipstick SMALL (*)    All other components within normal limits  I-STAT CHEM 8, ED - Abnormal; Notable for the following components:   Creatinine, Ser 1.30 (*)    Glucose, Bld 134 (*)    All other components within normal limits  CBG MONITORING, ED - Abnormal; Notable for the following components:   Glucose-Capillary 154 (*)    All other components within normal limits  ETHANOL  PROTIME-INR  APTT    EKG EKG Interpretation  Date/Time:  Sunday September 02 2021 17:12:34 EDT Ventricular Rate:  69 PR Interval:  178 QRS Duration: 98 QT Interval:  385 QTC Calculation: 413 R Axis:   -18 Text Interpretation: Sinus rhythm Borderline left axis deviation similar to July 2023 Confirmed by Sherwood Gambler [phone]) on 09/02/2021 5:42:35 PM  Radiology CT HEAD WO CONTRAST  Result Date: 09/02/2021 CLINICAL DATA:  Altered mental status. EXAM: CT HEAD WITHOUT CONTRAST TECHNIQUE: Contiguous axial images were obtained from the base of the skull through the vertex without intravenous contrast. RADIATION DOSE REDUCTION: This exam was performed according to the departmental dose-optimization program which includes automated exposure control, adjustment of the mA and/or kV according to patient size and/or use of iterative reconstruction technique. COMPARISON:  07/08/2021.  Brain MR dated 07/09/2021. FINDINGS: Brain: Normal appearing cerebral hemispheres and posterior fossa structures. Normal size and position of the ventricles. No  intracranial hemorrhage, mass lesion or CT evidence of acute infarction. Vascular: No hyperdense vessel or unexpected calcification. Skull: Normal. Negative for fracture or focal lesion. Sinuses/Orbits: Unremarkable. Other: None. IMPRESSION: Normal examination, unchanged. Electronically Signed   By: Claudie Revering M.D.   On: 09/02/2021 18:41    Procedures Procedures    Medications Ordered in ED Medications  lactated ringers bolus 1,000 mL (0 mLs Intravenous Stopped 09/02/21 1901)  LORazepam (ATIVAN) injection 0.5 mg (0.5 mg Intravenous Given 09/02/21 1808)    ED Course/ Medical Decision Making/ A&P Clinical Course as of 09/02/21 2300  Sun Sep 02, 2021  1704 Was called to triage to assess patient.  Based on history and physical  as above, I do not think this is an acute stroke.  Sounds very similar to when he was here back in July and he tells me it feels the exact same way and it resolved with a bag of fluids.  We will still get head CT but with NIHSS of 0 and previous history of same with negative MRI I don't think code stroke needs to be activated [SG]    Clinical Course User Index [SG] Sherwood Gambler, MD                           Medical Decision Making Amount and/or Complexity of Data Reviewed Labs: ordered. Radiology: ordered.  Risk Prescription drug management.   This seems similar to when he was here in July.  No focal deficits on my exam. Seems to be better after a small dose of ativan (which he required for CT of head).  Labs are unremarkable including no obvious AKI, which patient was suspicious of.  UDS positive for THC.  After coming back from CT he is feeling a lot better.  CT head images viewed/interpreted by myself and no head bleed.  Low suspicion he has a stroke.  At this point, especially with him feeling better, I think he will need to follow-up again with neurology and his PCP.  He appears stable for discharge home.        Final Clinical Impression(s) / ED  Diagnoses Final diagnoses:  Confusion    Rx / DC Orders ED Discharge Orders     None         Sherwood Gambler, MD 09/02/21 2302

## 2021-09-02 NOTE — ED Triage Notes (Signed)
Pt to the ED with complaints of confusion with an onset of 1600 this afternoon.  Pt answers all orientation questions appropriately.  Pt states he is having confusion to time. Pt states he feels like he has been in triage for an hour already.  EDP made aware.

## 2021-09-02 NOTE — Discharge Instructions (Addendum)
If you  develop new or worsening confusion, headache, vision changes, speech difficulty, weakness or numbness, or any other new/concerning symptoms then return to the ER for evaluation.

## 2021-09-03 ENCOUNTER — Encounter: Payer: Self-pay | Admitting: Neurology

## 2021-09-03 DIAGNOSIS — K5904 Chronic idiopathic constipation: Secondary | ICD-10-CM | POA: Diagnosis not present

## 2021-09-03 DIAGNOSIS — E860 Dehydration: Secondary | ICD-10-CM | POA: Diagnosis not present

## 2021-09-04 NOTE — Telephone Encounter (Signed)
Thank you for the update!

## 2021-09-25 DIAGNOSIS — G4733 Obstructive sleep apnea (adult) (pediatric): Secondary | ICD-10-CM | POA: Diagnosis not present

## 2021-09-26 DIAGNOSIS — G4733 Obstructive sleep apnea (adult) (pediatric): Secondary | ICD-10-CM | POA: Diagnosis not present

## 2021-10-06 DIAGNOSIS — E785 Hyperlipidemia, unspecified: Secondary | ICD-10-CM | POA: Diagnosis not present

## 2021-10-06 DIAGNOSIS — G2581 Restless legs syndrome: Secondary | ICD-10-CM | POA: Diagnosis not present

## 2021-10-06 DIAGNOSIS — F419 Anxiety disorder, unspecified: Secondary | ICD-10-CM | POA: Diagnosis not present

## 2021-10-06 DIAGNOSIS — I2510 Atherosclerotic heart disease of native coronary artery without angina pectoris: Secondary | ICD-10-CM | POA: Diagnosis not present

## 2021-10-25 DIAGNOSIS — G4733 Obstructive sleep apnea (adult) (pediatric): Secondary | ICD-10-CM | POA: Diagnosis not present

## 2021-11-06 DIAGNOSIS — G2581 Restless legs syndrome: Secondary | ICD-10-CM | POA: Diagnosis not present

## 2021-11-06 DIAGNOSIS — E785 Hyperlipidemia, unspecified: Secondary | ICD-10-CM | POA: Diagnosis not present

## 2021-11-06 DIAGNOSIS — I2510 Atherosclerotic heart disease of native coronary artery without angina pectoris: Secondary | ICD-10-CM | POA: Diagnosis not present

## 2021-11-06 DIAGNOSIS — F419 Anxiety disorder, unspecified: Secondary | ICD-10-CM | POA: Diagnosis not present

## 2021-11-25 DIAGNOSIS — G4733 Obstructive sleep apnea (adult) (pediatric): Secondary | ICD-10-CM | POA: Diagnosis not present

## 2021-11-30 ENCOUNTER — Other Ambulatory Visit: Payer: Self-pay | Admitting: Physician Assistant

## 2021-12-03 NOTE — Telephone Encounter (Signed)
Next Visit: 12/25/2021   Last Visit: 06/20/2021   Last Fill: 07/16/2021   DX: Idiopathic chronic gout of multiple sites without tophus    Current Dose per office note 06/20/2021: Allopurinol 300 mg 1 tablet by mouth daily    Labs: 09/02/2021 RDW 15.9, Eosinophils Absolute 0.6, Glucose 142, 06/20/2021 Uric Acid 6.2   Okay to refill Allopurinol?

## 2021-12-12 NOTE — Progress Notes (Signed)
Office Visit Note  Patient: Peter Burch             Date of Birth: 1955/03/27           MRN: [id number]             PCP: Sharilyn Sites, MD Referring: Sharilyn Sites, MD Visit Date: 12/25/2021 Occupation: '@GUAROCC'$ @  Subjective:  Follow-up (Right knee pain)   History of Present Illness: Peter Burch is a 66 y.o. male tree of gouty arthropathy and osteoarthritis.  He states about 2 to 3 weeks ago he had a gout flare after consuming some beef.  The flare was in his right great toe.  He took colchicine and the symptoms resolved after 3 to 4 days.  He has been having increased pain and discomfort in his right knee joint for the last 3 months.  He states initially the discomfort was behind his knee but now it is on the medial aspect of his knee joint.  None of the other joints are painful or swollen.  He has been taking allopurinol 300 mg a day without interruption.  Activities of Daily Living:  Patient reports morning stiffness for 5 minutes.   Patient Denies nocturnal pain.  Difficulty dressing/grooming: Denies Difficulty climbing stairs: Reports Difficulty getting out of chair: Reports Difficulty using hands for taps, buttons, cutlery, and/or writing: Denies  Review of Systems  Constitutional:  Positive for fatigue.  HENT:  Negative for mouth sores and mouth dryness.   Eyes:  Negative for dryness.  Respiratory:  Negative for shortness of breath.   Cardiovascular:  Negative for chest pain and palpitations.  Gastrointestinal:  Positive for constipation and diarrhea. Negative for blood in stool.  Endocrine: Negative for increased urination.  Genitourinary:  Negative for involuntary urination.  Musculoskeletal:  Positive for joint pain, joint pain, joint swelling and morning stiffness. Negative for gait problem, myalgias, muscle weakness, muscle tenderness and myalgias.  Skin:  Negative for color change, rash, hair loss and sensitivity to sunlight.  Allergic/Immunologic: Negative  for susceptible to infections.  Neurological:  Negative for dizziness and headaches.  Hematological:  Negative for swollen glands.  Psychiatric/Behavioral:  Negative for depressed mood and sleep disturbance. The patient is not nervous/anxious.     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   Idiopathic chronic gout of multiple sites without tophus 01/15/2016   Mixed hyperlipidemia 09/01/2012   Atherosclerosis of coronary artery of native heart without angina pectoris 09/01/2012    Past Medical History:  Diagnosis Date   CAD (coronary artery disease)    Constipation    Gout    Hypercholesteremia    Sinus bradycardia     Family History  Problem Relation Age of Onset   Heart attack Mother    Cancer Father        lung   Past Surgical History:  Procedure Laterality Date   CARDIAC CATHETERIZATION  05/16/2006   40% LAD lesion   COLONOSCOPY N/A 01/19/2015   Procedure: COLONOSCOPY;  Surgeon: Rogene Houston, MD;  [facility];  Service: Endoscopy;  Laterality: N/A;  [place]  07/31/2009   mod to severe defect due to infarct/scar w/mild  perinfarct ischemia   US ECHOCARDIOGRAPHY  07/31/2009   impaired LV relaxation   Social History   Social History Narrative   Not on file   Immunization History  Administered Date(s) Administered   Moderna Sars-Covid-2 Vaccination 01/12/2019, 02/09/2019, 11/11/2019     Objective: Vital Signs: BP 102/71 (BP Location: Right Arm, Patient Position: Sitting, Cuff Size: Normal)   Pulse 78   Resp 15   Ht '6\' 3"'$  (1.905 m)   Wt 245 lb (111.1 kg)   BMI 30.62 kg/m    Physical Exam Vitals and nursing note reviewed.  Constitutional:       Appearance: He is well-developed.  HENT:     Head: Normocephalic and atraumatic.  Eyes:     Conjunctiva/sclera: Conjunctivae normal.     Pupils: Pupils are equal, round, and reactive to light.  Cardiovascular:     Rate and Rhythm: Normal rate and regular rhythm.     Heart sounds: Normal heart sounds.  Pulmonary:     Effort: Pulmonary effort is normal.     Breath sounds: Normal breath sounds.  Abdominal:     General: Bowel sounds are normal.     Palpations: Abdomen is soft.  Musculoskeletal:     Cervical back: Normal range of motion and neck supple.  Skin:    General: Skin is warm and dry.     Capillary Refill: Capillary refill takes less than 2 seconds.  Neurological:     Mental Status: He is alert and oriented to person, place, and time.  Psychiatric:        Behavior: Behavior normal.      Musculoskeletal Exam: Cervical spine was in good motion.  Shoulder joints, elbow joints, wrist joints with good range of motion.  He had bilateral PIP and DIP thickening with no synovitis.  Hip joints and knee joints in good range of motion.  Right knee joint was in good range of motion without any warmth swelling or effusion.  There was no tenderness over ankles or MTPs.  CDAI Exam: CDAI Score: -- Patient Global: --; Provider Global: -- Swollen: --; Tender: -- Joint Exam 12/25/2021   No joint exam has been documented for this visit   There is currently no information documented on the homunculus. Go to the Rheumatology activity and complete the homunculus joint exam.  Investigation: No additional findings.  Imaging: No results found.  Recent Labs: Lab Results  Component Value Date   WBC 8.1 09/02/2021   HGB 15.0 09/02/2021   PLT 191 09/02/2021   NA 142 09/02/2021   K 3.8 09/02/2021   CL 104 09/02/2021   CO2 25 09/02/2021   GLUCOSE 134 (H) 09/02/2021   BUN 16 09/02/2021   CREATININE 1.30 (H) 09/02/2021   BILITOT 0.8 09/02/2021   ALKPHOS 81 09/02/2021   AST 18  09/02/2021   ALT 21 09/02/2021   PROT 6.8 09/02/2021   ALBUMIN 4.2 09/02/2021   CALCIUM 9.3 09/02/2021   GFRAA 69 01/20/2020    Speciality Comments: No specialty comments available.  Procedures:  Large Joint Inj on 12/25/2021 1:16 PM Indications: pain Details: 27 G 1.5 in needle, medial approach  Arthrogram: No  Medications: 40 mg triamcinolone acetonide 40 MG/ML; 1.5 mL lidocaine 1 % Aspirate: 0 mL Outcome: tolerated well, no immediate complications Procedure, treatment alternatives, risks and benefits explained, specific risks discussed. Consent was given by the patient. Immediately prior to procedure a time out was called to verify the correct patient, procedure, equipment, support staff and site/side  marked as required. Patient was prepped and draped in the usual sterile fashion.     Allergies: Statins   Assessment / Plan:     Visit Diagnoses: Idiopathic chronic gout of multiple sites without tophus - uric acid: 6.2 on 06/20/2021. -Patient reports having a gout flare about 2 to 3 weeks ago after consuming beef.  He states that a flare was in his right big toe.  The symptoms resolved after taking colchicine for 3 to 4 days.  He has been taking allopurinol on a regular basis.  Low purine diet was discussed.  Will check uric acid level today.  Plan: Uric acid  Medication monitoring encounter - Allopurinol 300 mg 1 tablet by mouth daily and Colchicine 0.6 mg 1 capsule by mouth daily as needed. -Labs obtained on September 02, 2021 were reviewed which showed normal CBC, CMP showed creatinine elevated at 1.30.  Will check labs today. -Plan: CBC with Differential/Platelet, COMPLETE METABOLIC PANEL WITH GFR today.  Primary osteoarthritis of both hands-he has bilateral PIP and DIP thickening.  No synovitis was noted.  Joint protection was discussed.  Dupuytren's contracture of both hands-unchanged.  Chronic pain of right knee -he has been experiencing pain and discomfort in his right knee  for the last few weeks.  He states he has had problems with right knee joint pain in the past which responded to cortisone injection.  He states that his right knee joint was swollen recently.  No warmth swelling or effusion was noted.  He had good range of motion of his right knee joint.  Plan: XR KNEE 3 VIEW RIGHT.  X-rays showed moderate medial compartment narrowing and moderate chondromalacia patella.  X-ray findings were reviewed with the patient.  Per patient's request after informed consent was obtained right knee joint was injected with lidocaine and Kenalog as described above.  He tolerated the procedure well.  Postprocedure instructions were given.  A handout on knee joint exercises was given.  Use of sleeve brace was discussed.  If he has an inadequate response to the cortisone injection we may consider Visco supplement injections in the future.  Primary osteoarthritis of both knees-he will benefit from lower extremity muscle strengthening exercises.  History of renal stone-no recurrent Raynauds.  Dyslipidemia  History of coronary artery disease  Orders: Orders Placed This Encounter  Procedures   Large Joint Inj   XR KNEE 3 VIEW RIGHT   CBC with Differential/Platelet   COMPLETE METABOLIC PANEL WITH GFR   Uric acid   No orders of the defined types were placed in this encounter.    Follow-Up Instructions: Return in about 6 months (around 06/26/2022) for Gout.   Bo Merino, MD  Note - This record has been created using Editor, commissioning.  Chart creation errors have been sought, but may not always  have been located. Such creation errors do not reflect on  the standard of medical care.

## 2021-12-14 DIAGNOSIS — K5900 Constipation, unspecified: Secondary | ICD-10-CM | POA: Diagnosis not present

## 2021-12-25 ENCOUNTER — Ambulatory Visit (INDEPENDENT_AMBULATORY_CARE_PROVIDER_SITE_OTHER): Payer: PPO

## 2021-12-25 ENCOUNTER — Ambulatory Visit: Payer: PPO | Attending: Rheumatology | Admitting: Rheumatology

## 2021-12-25 ENCOUNTER — Encounter: Payer: Self-pay | Admitting: Rheumatology

## 2021-12-25 VITALS — BP 102/71 | HR 78 | Resp 15 | Ht 75.0 in | Wt 245.0 lb

## 2021-12-25 DIAGNOSIS — E785 Hyperlipidemia, unspecified: Secondary | ICD-10-CM | POA: Diagnosis not present

## 2021-12-25 DIAGNOSIS — M19041 Primary osteoarthritis, right hand: Secondary | ICD-10-CM

## 2021-12-25 DIAGNOSIS — M720 Palmar fascial fibromatosis [Dupuytren]: Secondary | ICD-10-CM

## 2021-12-25 DIAGNOSIS — Z5181 Encounter for therapeutic drug level monitoring: Secondary | ICD-10-CM | POA: Diagnosis not present

## 2021-12-25 DIAGNOSIS — M25561 Pain in right knee: Secondary | ICD-10-CM

## 2021-12-25 DIAGNOSIS — Z8639 Personal history of other endocrine, nutritional and metabolic disease: Secondary | ICD-10-CM

## 2021-12-25 DIAGNOSIS — M19042 Primary osteoarthritis, left hand: Secondary | ICD-10-CM

## 2021-12-25 DIAGNOSIS — G8929 Other chronic pain: Secondary | ICD-10-CM

## 2021-12-25 DIAGNOSIS — M170 Bilateral primary osteoarthritis of knee: Secondary | ICD-10-CM

## 2021-12-25 DIAGNOSIS — M1A09X0 Idiopathic chronic gout, multiple sites, without tophus (tophi): Secondary | ICD-10-CM | POA: Diagnosis not present

## 2021-12-25 DIAGNOSIS — Z8679 Personal history of other diseases of the circulatory system: Secondary | ICD-10-CM | POA: Diagnosis not present

## 2021-12-25 DIAGNOSIS — Z87442 Personal history of urinary calculi: Secondary | ICD-10-CM | POA: Diagnosis not present

## 2021-12-25 DIAGNOSIS — G4733 Obstructive sleep apnea (adult) (pediatric): Secondary | ICD-10-CM | POA: Diagnosis not present

## 2021-12-25 MED ORDER — TRIAMCINOLONE ACETONIDE 40 MG/ML IJ SUSP
40.0000 mg | INTRAMUSCULAR | Status: AC | PRN
  Administered 2021-12-25: 40 mg via INTRA_ARTICULAR

## 2021-12-25 MED ORDER — LIDOCAINE HCL 1 % IJ SOLN
1.5000 mL | INTRAMUSCULAR | Status: AC | PRN
  Administered 2021-12-25: 1.5 mL

## 2021-12-25 NOTE — Patient Instructions (Signed)

## 2021-12-26 LAB — COMPLETE METABOLIC PANEL WITH GFR
AG Ratio: 1.9 (calc) (ref 1.0–2.5)
ALT: 21 U/L (ref 9–46)
AST: 17 U/L (ref 10–35)
Albumin: 4.4 g/dL (ref 3.6–5.1)
Alkaline phosphatase (APISO): 70 U/L (ref 35–144)
BUN/Creatinine Ratio: 11 (calc) (ref 6–22)
BUN: 16 mg/dL (ref 7–25)
CO2: 29 mmol/L (ref 20–32)
Calcium: 9.7 mg/dL (ref 8.6–10.3)
Chloride: 103 mmol/L (ref 98–110)
Creat: 1.41 mg/dL — ABNORMAL HIGH (ref 0.70–1.35)
Globulin: 2.3 g/dL (calc) (ref 1.9–3.7)
Glucose, Bld: 106 mg/dL — ABNORMAL HIGH (ref 65–99)
Potassium: 4.2 mmol/L (ref 3.5–5.3)
Sodium: 141 mmol/L (ref 135–146)
Total Bilirubin: 0.5 mg/dL (ref 0.2–1.2)
Total Protein: 6.7 g/dL (ref 6.1–8.1)
eGFR: 54 mL/min/{1.73_m2} — ABNORMAL LOW (ref >=60)

## 2021-12-26 LAB — CBC WITH DIFFERENTIAL/PLATELET
Absolute Monocytes: 724 cells/uL (ref 200–950)
Basophils Absolute: 65 cells/uL (ref 0–200)
Basophils Relative: 0.6 %
Eosinophils Absolute: 540 cells/uL — ABNORMAL HIGH (ref 15–500)
Eosinophils Relative: 5 %
HCT: 42.8 % (ref 38.5–50.0)
Hemoglobin: 13.8 g/dL (ref 13.2–17.1)
Lymphs Abs: 1350 cells/uL (ref 850–3900)
MCH: 26.1 pg — ABNORMAL LOW (ref 27.0–33.0)
MCHC: 32.2 g/dL (ref 32.0–36.0)
MCV: 81.1 fL (ref 80.0–100.0)
MPV: 11.5 fL (ref 7.5–12.5)
Monocytes Relative: 6.7 %
Neutro Abs: 8122 cells/uL — ABNORMAL HIGH (ref 1500–7800)
Neutrophils Relative %: 75.2 %
Platelets: 221 10*3/uL (ref 140–400)
RBC: 5.28 10*6/uL (ref 4.20–5.80)
RDW: 15.3 % — ABNORMAL HIGH (ref 11.0–15.0)
Total Lymphocyte: 12.5 %
WBC: 10.8 10*3/uL (ref 3.8–10.8)

## 2021-12-26 LAB — URIC ACID: Uric Acid, Serum: 6.4 mg/dL (ref 4.0–8.0)

## 2021-12-26 NOTE — Progress Notes (Signed)
CBC and CMP are stable.  Creatinine is elevated.  Patient should avoid all NSAIDs.  Uric acid is 6.4.  Patient continues to have mild flares.  If he is taking allopurinol on a regular basis and is still having flares then I will increase the dose of allopurinol to 300 mg, 1-1/2 tablet (450 mg) p.o. daily.  Please discussed with the patient.

## 2022-01-11 DIAGNOSIS — R972 Elevated prostate specific antigen [PSA]: Secondary | ICD-10-CM | POA: Diagnosis not present

## 2022-01-11 DIAGNOSIS — I2510 Atherosclerotic heart disease of native coronary artery without angina pectoris: Secondary | ICD-10-CM | POA: Diagnosis not present

## 2022-01-11 DIAGNOSIS — F411 Generalized anxiety disorder: Secondary | ICD-10-CM | POA: Diagnosis not present

## 2022-01-11 DIAGNOSIS — E119 Type 2 diabetes mellitus without complications: Secondary | ICD-10-CM | POA: Diagnosis not present

## 2022-01-11 DIAGNOSIS — I493 Ventricular premature depolarization: Secondary | ICD-10-CM | POA: Diagnosis not present

## 2022-01-11 DIAGNOSIS — Z0000 Encounter for general adult medical examination without abnormal findings: Secondary | ICD-10-CM | POA: Diagnosis not present

## 2022-01-11 DIAGNOSIS — Z6829 Body mass index (BMI) 29.0-29.9, adult: Secondary | ICD-10-CM | POA: Diagnosis not present

## 2022-01-11 DIAGNOSIS — Z1331 Encounter for screening for depression: Secondary | ICD-10-CM | POA: Diagnosis not present

## 2022-01-11 DIAGNOSIS — G4733 Obstructive sleep apnea (adult) (pediatric): Secondary | ICD-10-CM | POA: Diagnosis not present

## 2022-01-11 DIAGNOSIS — E782 Mixed hyperlipidemia: Secondary | ICD-10-CM | POA: Diagnosis not present

## 2022-01-11 DIAGNOSIS — E663 Overweight: Secondary | ICD-10-CM | POA: Diagnosis not present

## 2022-01-22 ENCOUNTER — Encounter (INDEPENDENT_AMBULATORY_CARE_PROVIDER_SITE_OTHER): Payer: Self-pay | Admitting: *Deleted

## 2022-01-30 DIAGNOSIS — G4733 Obstructive sleep apnea (adult) (pediatric): Secondary | ICD-10-CM | POA: Diagnosis not present

## 2022-02-11 ENCOUNTER — Encounter: Payer: Self-pay | Admitting: Cardiovascular Disease

## 2022-02-11 ENCOUNTER — Ambulatory Visit: Payer: PPO | Attending: Cardiovascular Disease | Admitting: Cardiovascular Disease

## 2022-02-11 VITALS — BP 106/74 | HR 85 | Ht 75.0 in | Wt 242.6 lb

## 2022-02-11 DIAGNOSIS — R7303 Prediabetes: Secondary | ICD-10-CM

## 2022-02-11 DIAGNOSIS — I2510 Atherosclerotic heart disease of native coronary artery without angina pectoris: Secondary | ICD-10-CM

## 2022-02-11 DIAGNOSIS — R55 Syncope and collapse: Secondary | ICD-10-CM

## 2022-02-11 DIAGNOSIS — E782 Mixed hyperlipidemia: Secondary | ICD-10-CM

## 2022-02-11 DIAGNOSIS — I493 Ventricular premature depolarization: Secondary | ICD-10-CM | POA: Diagnosis not present

## 2022-02-11 DIAGNOSIS — G4733 Obstructive sleep apnea (adult) (pediatric): Secondary | ICD-10-CM | POA: Diagnosis not present

## 2022-02-11 NOTE — Progress Notes (Unsigned)
Cardiology Office Note    Date:  02/11/2022   ID:  BARAN KUHRT, DOB [birth year], MRN [id number]  PCP:  Sharilyn Sites, MD  Cardiologist:   Sanda Klein, MD   Chief complaint: palpitations, near-syncope   History of Present Illness:  Peter Burch is a 67 y.o. male with known mild coronary artery disease (40% proximal LAD calcified lesion by cath 2008), without symptoms of angina pectoris and with a low risk nuclear stress test in 2011. He is known to have an inferior wall defect on nuclear stress test that is a false positive abnormality (this actually led to the heart cath in 2008).  Coronary CT in January 2021 showed no evidence of meaningful blockages, but his calcium score was 1351 (95th percentile) and he had a moderate proximal LAD stenosis estimated at about 50%, not significant by FFR.  He has hyperlipidemia but has been unable to tolerate numerous statins. He did take Zetia without side effects, but this was ineffective so he is currently receiving Repatha.  He has a previous history of episodes of orthostatic dizziness and palpitations.  In the past, his palpitations have correlated with PVCs on ECG.  He has never experienced full-blown syncope.  He continues to have a lot of problems with constipation.  He is taking Linzess.  Sometimes he has very large and frequent bowel movements and feels exhausted afterwards.  This happened on July 2.  Later that day he went to an outside celebration at church and he felt very unwell.  He felt weak.  He found it very difficult to focus on other people speech and thinks that he may have briefly lost consciousness while sitting on a bench and talking to his nephew.  He did not lose postural control.  On the way home in the car he felt very poorly and had difficulty speaking.  His wife noticed that he was having more skipped beats than usual.  They called EMS.  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  He was taken to the emergency room where he had CT of the head, labs and eventually an MRI of the head.  There were no abnormalities on imaging studies.  His creatinine was elevated at 1.48.  He noticed that his first urine was very dark.  He reports feeling much better after he received intravenous fluids in preparation for the scans.  He has sleep apnea and started CPAP last December.  He has not been exercising regularly.  He had a recent gout attack.  Recent lipid profile performed on January 4 showed a total cholesterol 138, HDL 45, LDL 64 and borderline elevated triglycerides at 172.  His recent hemoglobin A1c was also mildly elevated at 5.8%.    Past Medical History:  Diagnosis Date   CAD (coronary artery disease)    Constipation    Gout    Hypercholesteremia    Sinus bradycardia     Past Surgical History:  Procedure Laterality Date   CARDIAC CATHETERIZATION  05/16/2006   40% LAD lesion   COLONOSCOPY N/A 01/19/2015   Procedure: COLONOSCOPY;  Surgeon: Rogene Houston, MD;  [facility];  Service: Endoscopy;  Laterality: N/A;  [place]  07/31/2009   mod to severe defect due to infarct/scar w/mild perinfarct ischemia   US ECHOCARDIOGRAPHY  07/31/2009   impaired LV relaxation  Current Medications: Outpatient Medications Prior to Visit  Medication Sig Dispense Refill   allopurinol (ZYLOPRIM) 300 MG tablet TAKE (1) TABLET BY MOUTH ONCE DAILY. 90 tablet 0   ALPRAZolam (XANAX) 0.5 MG tablet Take 0.25-0.5 tablets by mouth daily as needed for anxiety.      esomeprazole (NEXIUM) 20 MG capsule Take 20 mg by mouth daily as needed (for GERD).     ezetimibe (ZETIA) 10 MG tablet TAKE ONE TABLET BY MOUTH ONCE DAILY. 90 tablet 3   MITIGARE 0.6 MG CAPS Take 0.6 mg by mouth daily. 30 capsule 1   REPATHA SURECLICK 536 MG/ML SOAJ INJECT '140MG'$  INTO THE SKIN EVERY 14 DAYS. 2 mL 11   TRULANCE 3 MG TABS  Take 1 tablet by mouth daily.     clotrimazole-betamethasone (LOTRISONE) cream Apply topically 2 (two) times daily as needed. (Patient not taking: Reported on 02/11/2022)     fluticasone (FLONASE) 50 MCG/ACT nasal spray Place 1 spray into the nose as needed for allergies. (Patient not taking: Reported on 02/11/2022)     No facility-administered medications prior to visit.     Allergies:   Statins   Social History   Socioeconomic History   Marital status: Married    Spouse name: Not on file   Number of children: Not on file   Years of education: Not on file   Highest education level: Not on file  Occupational History   Not on file  Tobacco Use   Smoking status: Never    Passive exposure: Past   Smokeless tobacco: Never  Vaping Use   Vaping Use: Never used  Substance and Sexual Activity   Alcohol use: Not on file   Drug use: Not Currently    Comment: states he has never done any   Sexual activity: Not on file  Other Topics Concern   Not on file  Social History Narrative   Not on file   Social Determinants of Health   Financial Resource Strain: Not on file  Food Insecurity: Not on file  Transportation Needs: Not on file  Physical Activity: Not on file  Stress: Not on file  Social Connections: Not on file     Family History:  The patient's family history includes Cancer in his father; Heart attack in his mother.   ROS:   Please see the history of present illness.    ROS All other systems are reviewed and are negative.   PHYSICAL EXAM:   VS:  BP 106/74 (BP Location: Left Arm, Patient Position: Sitting, Cuff Size: Large)   Pulse 85   Ht '6\' 3"'$  (1.905 m)   Wt 242 lb 9.6 oz (110 kg)   SpO2 94%   BMI 30.32 kg/m       General: Alert, oriented x3, no distress, borderline obese Head: no evidence of trauma, PERRL, EOMI, no exophtalmos or lid lag, no myxedema, no xanthelasma; normal ears, nose and oropharynx Neck: normal jugular venous pulsations and no hepatojugular  reflux; brisk carotid pulses without delay and no carotid bruits Chest: clear to auscultation, no signs of consolidation by percussion or palpation, normal fremitus, symmetrical and full respiratory excursions Cardiovascular: normal position and quality of the apical impulse, regular rhythm, with rare ectopic beats, normal first and second heart sounds, no murmurs, rubs or gallops Abdomen: no tenderness or distention, no masses by palpation, no abnormal pulsatility or arterial bruits, normal bowel sounds, no hepatosplenomegaly Extremities: no clubbing, cyanosis or edema; 2+ radial, ulnar and brachial pulses bilaterally; 2+  right femoral, posterior tibial and dorsalis pedis pulses; 2+ left femoral, posterior tibial and dorsalis pedis pulses; no subclavian or femoral bruits Neurological: grossly nonfocal Psych: Normal mood and affect    Wt Readings from Last 3 Encounters:  02/11/22 242 lb 9.6 oz (110 kg)  12/25/21 245 lb (111.1 kg)  09/02/21 241 lb 6.5 oz (109.5 kg)      Studies/Labs Reviewed:   EKG:  EKG is not ordered today.  The tracing from the ER visit 07/08/2021 showed normal sinus rhythm with a single PVC, otherwise normal.  Recent Labs: 12/25/2021: ALT 21; BUN 16; Creat 1.41; Hemoglobin 13.8; Platelets 221; Potassium 4.2; Sodium 141   Lipid Panel    Component Value Date/Time   CHOL 101 04/28/2020 1035   TRIG 171 (H) 04/28/2020 1035   HDL 47 04/28/2020 1035   CHOLHDL 2.1 04/28/2020 1035   CHOLHDL 4.8 01/20/2019 1128   VLDL 36 01/20/2019 1128   LDLCALC 26 04/28/2020 1035      ASSESSMENT:    No diagnosis found.    PLAN:  In order of problems listed above:  Near syncope: I think this has all the hallmarks of hypotension, although a low blood pressure was not actually documented.  He was probably dehydrated after having multiple bowel movements and spending the afternoon outside in the heat.  He improved with IV fluids.  His creatinine was elevated above his baseline.    PVCs: These appear to be the cause of his palpitations.  Had more frequent irregular beats during his episode of near syncope, but hard to say that these were causal.  We will check an event monitor.  Encouraged him to get some type of personal monitoring device such as a smart watch or Kardia. CAD: No symptoms of angina pectoris.  Cardiac enzymes were normal following his recent events.  ECG shows no ischemic changes.  He has a very high plaque burden, but no significant flow-limiting stenoses.  He does not have angina pectoris.  To continue aggressive lipid-lowering therapy and encouraged more physical activity.   History of false positive nuclear stress test in the past.  Coronary CTA shows high plaque burden and a proximal LAD moderate lesion, but no flow-limiting stenoses angiographically or by CT FFR.   HLP: Excellent LDL cholesterol level on Repatha, which he will continue. PreDM: Continues to have periodic elevation in hemoglobin A1c in the prediabetes range, and this seems to correlate with his periods of more sedentary lifestyle. OSA: Recently prescribed CPAP, still adjusting to therapy. Constipation: Has become one of his most pressing complaints over the last year or so. Hyperuricemia/history of gout: Recent gout attack has subsided.   Medication Adjustments/Labs and Tests Ordered: Current medicines are reviewed at length with the patient today.  Concerns regarding medicines are outlined above.  Medication changes, Labs and Tests ordered today are listed in the Patient Instructions below. Patient Instructions  Medication Instructions:  *** *If you need a refill on your cardiac medications before your next appointment, please call your pharmacy*   Lab Work: *** If you have labs (blood work) drawn today and your tests are completely normal, you will receive your results only by: [place] (if you have MyChart) OR A paper copy in the mail If you have any lab test that is  abnormal or we need to change your treatment, we will call you to review the results.   Testing/Procedures: ***   Follow-Up: At [facility], you and your health needs are our  priority.  As part of our continuing mission to provide you with exceptional heart care, we have created designated Provider Care Teams.  These Care Teams include your primary Cardiologist (physician) and Advanced Practice Providers (APPs -  Physician Assistants and Nurse Practitioners) who all work together to provide you with the care you need, when you need it.  We recommend signing up for the patient portal called "MyChart".  Sign up information is provided on this After Visit Summary.  MyChart is used to connect with patients for Virtual Visits (Telemedicine).  Patients are able to view lab/test results, encounter notes, upcoming appointments, etc.  Non-urgent messages can be sent to your provider as well.   To learn more about what you can do with MyChart, go to [email or web address].    Your next appointment:   {numbers 1-12:10294} {Time; day/wk/mo/yr(s):9076}  Provider:   {Providers/Teams        :[id number]} {If Card or EP not listed click to update   DO NOT delete brackets or number around this link :1}   Other Instructions ***     Signed, Sanda Klein, MD  02/11/2022 1:40 PM    [facility] [place], [place], [place]  [address] Phone: [phone]; Fax: [phone]

## 2022-02-11 NOTE — Patient Instructions (Signed)
Medication Instructions:  Stop taking Ezetimibe *If you need a refill on your cardiac medications before your next appointment, please call your pharmacy*   Lab Work: Fasting Lipid Panel in 2 months If you have labs (blood work) drawn today and your tests are completely normal, you will receive your results only by: [place] (if you have MyChart) OR A paper copy in the mail If you have any lab test that is abnormal or we need to change your treatment, we will call you to review the results.   Follow-Up: At [facility], you and your health needs are our priority.  As part of our continuing mission to provide you with exceptional heart care, we have created designated Provider Care Teams.  These Care Teams include your primary Cardiologist (physician) and Advanced Practice Providers (APPs -  Physician Assistants and Nurse Practitioners) who all work together to provide you with the care you need, when you need it.  We recommend signing up for the patient portal called "MyChart".  Sign up information is provided on this After Visit Summary.  MyChart is used to connect with patients for Virtual Visits (Telemedicine).  Patients are able to view lab/test results, encounter notes, upcoming appointments, etc.  Non-urgent messages can be sent to your provider as well.   To learn more about what you can do with MyChart, go to [email or web address].    Your next appointment:   1 year(s)  Provider:   Sanda Klein, MD

## 2022-03-21 DIAGNOSIS — E782 Mixed hyperlipidemia: Secondary | ICD-10-CM | POA: Diagnosis not present

## 2022-03-22 LAB — LIPID PANEL
Chol/HDL Ratio: 2.6 ratio (ref 0.0–5.0)
Cholesterol, Total: 115 mg/dL (ref 100–199)
HDL: 45 mg/dL (ref >39)
LDL Chol Calc (NIH): 45 mg/dL (ref 0–99)
Triglycerides: 144 mg/dL (ref 0–149)
VLDL Cholesterol Cal: 25 mg/dL (ref 5–40)

## 2022-03-25 ENCOUNTER — Other Ambulatory Visit (HOSPITAL_COMMUNITY): Payer: Self-pay | Admitting: Internal Medicine

## 2022-03-25 DIAGNOSIS — E119 Type 2 diabetes mellitus without complications: Secondary | ICD-10-CM | POA: Diagnosis not present

## 2022-03-25 DIAGNOSIS — E6609 Other obesity due to excess calories: Secondary | ICD-10-CM | POA: Diagnosis not present

## 2022-03-25 DIAGNOSIS — N5089 Other specified disorders of the male genital organs: Secondary | ICD-10-CM | POA: Diagnosis not present

## 2022-03-25 DIAGNOSIS — Z6830 Body mass index (BMI) 30.0-30.9, adult: Secondary | ICD-10-CM | POA: Diagnosis not present

## 2022-03-25 DIAGNOSIS — I25118 Atherosclerotic heart disease of native coronary artery with other forms of angina pectoris: Secondary | ICD-10-CM | POA: Diagnosis not present

## 2022-04-08 ENCOUNTER — Ambulatory Visit (HOSPITAL_COMMUNITY)
Admission: RE | Admit: 2022-04-08 | Discharge: 2022-04-08 | Disposition: A | Discharge status: Home / Self Care | Payer: PPO | Source: Ambulatory Visit | Attending: Internal Medicine | Admitting: Internal Medicine

## 2022-04-08 DIAGNOSIS — N433 Hydrocele, unspecified: Secondary | ICD-10-CM | POA: Diagnosis not present

## 2022-04-08 DIAGNOSIS — N503 Cyst of epididymis: Secondary | ICD-10-CM | POA: Diagnosis not present

## 2022-04-08 DIAGNOSIS — N5089 Other specified disorders of the male genital organs: Secondary | ICD-10-CM | POA: Insufficient documentation

## 2022-04-10 ENCOUNTER — Encounter: Payer: Self-pay | Admitting: Cardiovascular Disease

## 2022-04-10 NOTE — Telephone Encounter (Signed)
"  Slow heart rate" in the past was due to undercounting of frequent PVCs. The smart watch reports heart rate based on the plethysmography sensor (like a pulse ox). Both radial pulse palpation and the watch will miss the PVC beats. That is why we need the electrical tracing. Should be able to go into phone "Health" app, tap "show all health data" scroll down to find ECGs, tap on the tracing and then tap link "Export PDF" and send that attached to [employer].  It is normal for the BP to reach that range if under stress.

## 2022-04-12 ENCOUNTER — Encounter: Payer: Self-pay | Admitting: Cardiovascular Disease

## 2022-06-05 ENCOUNTER — Other Ambulatory Visit: Payer: Self-pay | Admitting: Physician Assistant

## 2022-06-05 NOTE — Telephone Encounter (Signed)
Last Fill: 12/03/2021  Labs: 12/25/2021 CBC and CMP are stable.  Creatinine is elevated.  Patient should avoid all NSAIDs.  Uric acid is 6.4.  Patient continues to have mild flares.  If he is taking allopurinol on a regular basis and is still having flares then I will increase the dose of allopurinol to 300 mg, 1-1/2 tablet (450 mg) p.o. daily.  Please discussed with the patient. Patient states he has not been taking his Allopurinol on a regular basis. Patient states he will start taking it again on a regular basis and if he continues to have flares he will contact the office.   Next Visit: 06/20/2022  Last Visit: 12/25/2021  DX:  Idiopathic chronic gout of multiple sites without tophus   Current Dose per office note on 12/25/2021: Allopurinol 300 mg 1 tablet by mouth daily   Okay to refill Allopurinol?

## 2022-06-06 NOTE — Progress Notes (Signed)
Office Visit Note  Patient: Peter Burch             Date of Birth: 03-03-1955           MRN: [id number]             PCP: Assunta Found, MD Referring: Assunta Found, MD Visit Date: 06/20/2022 Occupation: @GUAROCC @  Subjective:  Right knee pain   History of Present Illness: Peter Burch is a 67 y.o. male with history of gout and osteoarthritis.  He is taking Allopurinol 300 mg 1 tablet by mouth daily and Colchicine 0.6 mg 1 capsule by mouth daily as needed.  Patient reports that about 1 month ago he experienced a mild gout flare involving his right great toe.  He states that he thinks the flare was triggered by eating red meat.  He states that he took colchicine for 3 days which resolved the flare.  He has not had a recurrence.  Patient states that he has had occasional discomfort in his right knee joint especially if walking on uneven terrain.  He states that he has started to exercise on the elliptical 5 days a week which she has found to be helpful.  He denies any warmth or swelling in the right knee joint at this time.  Patient had a right knee joint cortisone injection on 12/25/2021 which righted significant relief.  He declined needing a repeat injection today. He requested a prescription for voltaren gel to be sent to the pharmacy today.     Activities of Daily Living:  Patient reports morning stiffness for 0 minutes.   Patient Denies nocturnal pain.  Difficulty dressing/grooming: Denies Difficulty climbing stairs: Denies Difficulty getting out of chair: Denies Difficulty using hands for taps, buttons, cutlery, and/or writing: Denies  Review of Systems  Constitutional:  Positive for fatigue.  HENT:  Negative for mouth sores and mouth dryness.   Eyes:  Negative for dryness.  Respiratory:  Negative for shortness of breath.   Cardiovascular:  Negative for chest pain and palpitations.  Gastrointestinal:  Negative for blood in stool, constipation and diarrhea.  Endocrine:  Negative for increased urination.  Genitourinary:  Positive for urgency. Negative for involuntary urination.  Musculoskeletal:  Positive for joint pain and joint pain. Negative for gait problem, joint swelling, myalgias, muscle weakness, morning stiffness, muscle tenderness and myalgias.  Skin:  Negative for color change, rash, hair loss and sensitivity to sunlight.  Allergic/Immunologic: Negative for susceptible to infections.  Neurological:  Negative for dizziness and headaches.  Hematological:  Negative for swollen glands.  Psychiatric/Behavioral:  Positive for sleep disturbance. Negative for depressed mood. The patient is not nervous/anxious.     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   Idiopathic chronic gout of multiple sites without tophus 01/15/2016   Mixed hyperlipidemia 09/01/2012   Atherosclerosis of coronary artery of native heart without angina pectoris 09/01/2012    Past Medical History:  Diagnosis Date   CAD (coronary artery disease)    Constipation    Gout    Hypercholesteremia    Sinus bradycardia     Family History  Problem Relation Age of Onset   Heart attack Mother    Cancer Father  lung   Past Surgical History:  Procedure Laterality Date   CARDIAC CATHETERIZATION  05/16/2006   40% LAD lesion   COLONOSCOPY N/A 01/19/2015   Procedure: COLONOSCOPY;  Surgeon: Malissa Hippo, MD;  Location: [facility];  Service: Endoscopy;  Laterality: N/A;  1030   NM MYOCAR PERF WALL MOTION  07/31/2009   mod to severe defect due to infarct/scar w/mild perinfarct ischemia   US ECHOCARDIOGRAPHY  07/31/2009   impaired LV relaxation   Social History   Social History Narrative   Not  on file   Immunization History  Administered Date(s) Administered   [employer] Vaccination 01/12/2019, 02/09/2019, 11/11/2019     Objective: Vital Signs: BP 126/79 (BP Location: Left Arm, Patient Position: Sitting, Cuff Size: Large)   Pulse 71   Resp 17   Ht 6\' 3"  (1.905 m)   Wt 240 lb 6.4 oz (109 kg)   BMI 30.05 kg/m    Physical Exam Vitals and nursing note reviewed.  Constitutional:      Appearance: He is well-developed.  HENT:     Head: Normocephalic and atraumatic.  Eyes:     Conjunctiva/sclera: Conjunctivae normal.     Pupils: Pupils are equal, round, and reactive to light.  Cardiovascular:     Rate and Rhythm: Normal rate and regular rhythm.     Heart sounds: Normal heart sounds.  Pulmonary:     Effort: Pulmonary effort is normal.     Breath sounds: Normal breath sounds.  Abdominal:     General: Bowel sounds are normal.     Palpations: Abdomen is soft.  Musculoskeletal:     Cervical back: Normal range of motion and neck supple.  Skin:    General: Skin is warm and dry.     Capillary Refill: Capillary refill takes less than 2 seconds.  Neurological:     Mental Status: He is alert and oriented to person, place, and time.  Psychiatric:        Behavior: Behavior normal.      Musculoskeletal Exam: C-spine, thoracic spine, lumbar spine have good range of motion.  No midline spinal tenderness.  Shoulder joints, elbow joints, wrist joints, MCPs, PIPs, DIPs have good range of motion with no synovitis.  Complete fist formation bilaterally.  Some PIP and DIP thickening consistent with osteoarthritis of both hands.  Hip joints have good range of motion with no groin pain.  Knee joints have good range of motion with no warmth or effusion.  Ankle joints have good range of motion with no tenderness or joint swelling.  CDAI Exam: CDAI Score: -- Patient Global: --; Provider Global: -- Swollen: --; Tender: -- Joint Exam 06/20/2022   No joint exam has been documented  for this visit   There is currently no information documented on the homunculus. Go to the Rheumatology activity and complete the homunculus joint exam.  Investigation: No additional findings.  Imaging: No results found.  Recent Labs: Lab Results  Component Value Date   WBC 10.8 12/25/2021   HGB 13.8 12/25/2021   PLT 221 12/25/2021   NA 141 12/25/2021   K 4.2 12/25/2021   CL 103 12/25/2021   CO2 29 12/25/2021   GLUCOSE 106 (H) 12/25/2021   BUN 16 12/25/2021   CREATININE 1.41 (H) 12/25/2021   BILITOT 0.5 12/25/2021   ALKPHOS 81 09/02/2021   AST 17 12/25/2021   ALT 21 12/25/2021   PROT 6.7 12/25/2021   ALBUMIN 4.2 09/02/2021   CALCIUM 9.7 12/25/2021  GFRAA 69 01/20/2020    Speciality Comments: No specialty comments available.  Procedures:  No procedures performed Allergies: Statins   Assessment / Plan:     Visit Diagnoses: Idiopathic chronic gout of multiple sites without tophus -He had a gout flare about 1 month ago involving the right great toe.  His flare was triggered by red meat and lasted for about 3 days.  His symptoms resolved with the use of colchicine x 3 days.  He remains on allopurinol 300 mg 1 tablet by mouth daily.  He is tolerating allopurinol without any side effects.  His uric acid was 6.4 on 12/25/2021.  Plan on checking uric acid along with CBC and CMP today.  He will remain on allopurinol as prescribed.  He was advised to notify us if he develops more recurrent flares.  He will follow-up in the office in 5 to 6 months or sooner if needed.  Plan: COMPLETE METABOLIC PANEL WITH GFR, CBC with Differential/Platelet, Uric acid  Medication monitoring encounter - Allopurinol 300 mg 1 tablet by mouth daily and Colchicine 0.6 mg 1 capsule by mouth daily as needed. CBC and CMP updated on 12/25/21.  Uric acid was 6.4 on 12/25/21.  The following orders released today.  - Plan: COMPLETE METABOLIC PANEL WITH GFR, CBC with Differential/Platelet, Uric acid  Primary  osteoarthritis of both hands: PIP and DIP thickening consistent with osteoarthritis of both hands.  No tenderness or inflammation noted on examination today.  Discussed the importance of joint protection and muscle strengthening.  Dupuytren's contracture of both hands: Unchanged.   Chronic pain of right knee: Patient had a right knee joint cortisone injection on 12/25/2021 which provided significant relief.  He continues to have occasional discomfort in the right knee especially if walking on uneven terrain.  He has started working out on the elliptical 5 days a week which she has found to be helpful.  He uses Voltaren gel topically as needed for pain relief.  He has not had any signs or symptoms of a gout flare.  He declined a repeat cortisone injection today.  Discussed the importance of lower extremity muscle strengthening.  Discussed that he can wear a brace for support while walking on uneven terrain in the future.  He requested a prescription for Voltaren gel to be sent to the pharmacy today.  Primary osteoarthritis of both knees: He has good range of motion of both knee joints with no warmth or effusion.  Patient requested a prescription for Voltaren gel to be sent to the pharmacy today.  Other medical conditions are listed as follows:  History of renal stone  Dyslipidemia  History of coronary artery disease  History of hyperlipidemia  Orders: Orders Placed This Encounter  Procedures   COMPLETE METABOLIC PANEL WITH GFR   CBC with Differential/Platelet   Uric acid   No orders of the defined types were placed in this encounter.   Follow-Up Instructions: Return for Gout.   Gearldine Bienenstock, PA-C  Note - This record has been created using Dragon software.  Chart creation errors have been sought, but may not always  have been located. Such creation errors do not reflect on  the standard of medical care.

## 2022-06-10 DIAGNOSIS — G4733 Obstructive sleep apnea (adult) (pediatric): Secondary | ICD-10-CM | POA: Diagnosis not present

## 2022-06-20 ENCOUNTER — Ambulatory Visit: Payer: PPO | Attending: Physician Assistant | Admitting: Physician Assistant

## 2022-06-20 ENCOUNTER — Encounter: Payer: Self-pay | Admitting: Physician Assistant

## 2022-06-20 ENCOUNTER — Other Ambulatory Visit: Payer: Self-pay

## 2022-06-20 VITALS — BP 126/79 | HR 71 | Resp 17 | Ht 75.0 in | Wt 240.4 lb

## 2022-06-20 DIAGNOSIS — Z5181 Encounter for therapeutic drug level monitoring: Secondary | ICD-10-CM

## 2022-06-20 DIAGNOSIS — M19041 Primary osteoarthritis, right hand: Secondary | ICD-10-CM | POA: Diagnosis not present

## 2022-06-20 DIAGNOSIS — M19042 Primary osteoarthritis, left hand: Secondary | ICD-10-CM

## 2022-06-20 DIAGNOSIS — E785 Hyperlipidemia, unspecified: Secondary | ICD-10-CM

## 2022-06-20 DIAGNOSIS — M1A09X0 Idiopathic chronic gout, multiple sites, without tophus (tophi): Secondary | ICD-10-CM

## 2022-06-20 DIAGNOSIS — G8929 Other chronic pain: Secondary | ICD-10-CM

## 2022-06-20 DIAGNOSIS — M170 Bilateral primary osteoarthritis of knee: Secondary | ICD-10-CM

## 2022-06-20 DIAGNOSIS — M720 Palmar fascial fibromatosis [Dupuytren]: Secondary | ICD-10-CM

## 2022-06-20 DIAGNOSIS — Z8639 Personal history of other endocrine, nutritional and metabolic disease: Secondary | ICD-10-CM | POA: Diagnosis not present

## 2022-06-20 DIAGNOSIS — Z87442 Personal history of urinary calculi: Secondary | ICD-10-CM

## 2022-06-20 DIAGNOSIS — M25561 Pain in right knee: Secondary | ICD-10-CM | POA: Diagnosis not present

## 2022-06-20 DIAGNOSIS — Z8679 Personal history of other diseases of the circulatory system: Secondary | ICD-10-CM | POA: Diagnosis not present

## 2022-06-20 MED ORDER — DICLOFENAC SODIUM 1 % EX GEL: CUTANEOUS | 2 refills | Status: DC

## 2022-06-20 NOTE — Telephone Encounter (Signed)
Please review and sign pended voltaren gel rx. Thanks!

## 2022-06-21 LAB — CBC WITH DIFFERENTIAL/PLATELET
Absolute Monocytes: 755 cells/uL (ref 200–950)
Basophils Absolute: 50 cells/uL (ref 0–200)
Basophils Relative: 0.6 %
Eosinophils Absolute: 722 cells/uL — ABNORMAL HIGH (ref 15–500)
Eosinophils Relative: 8.7 %
HCT: 39.3 % (ref 38.5–50.0)
Hemoglobin: 12.3 g/dL — ABNORMAL LOW (ref 13.2–17.1)
Lymphs Abs: 1320 cells/uL (ref 850–3900)
MCH: 25.8 pg — ABNORMAL LOW (ref 27.0–33.0)
MCHC: 31.3 g/dL — ABNORMAL LOW (ref 32.0–36.0)
MCV: 82.6 fL (ref 80.0–100.0)
MPV: 11.6 fL (ref 7.5–12.5)
Monocytes Relative: 9.1 %
Neutro Abs: 5453 cells/uL (ref 1500–7800)
Neutrophils Relative %: 65.7 %
Platelets: 210 10*3/uL (ref 140–400)
RBC: 4.76 10*6/uL (ref 4.20–5.80)
RDW: 14.9 % (ref 11.0–15.0)
Total Lymphocyte: 15.9 %
WBC: 8.3 10*3/uL (ref 3.8–10.8)

## 2022-06-21 LAB — COMPLETE METABOLIC PANEL WITH GFR
AG Ratio: 2.2 (calc) (ref 1.0–2.5)
ALT: 17 U/L (ref 9–46)
AST: 18 U/L (ref 10–35)
Albumin: 4.4 g/dL (ref 3.6–5.1)
Alkaline phosphatase (APISO): 72 U/L (ref 35–144)
BUN: 14 mg/dL (ref 7–25)
CO2: 27 mmol/L (ref 20–32)
Calcium: 9.5 mg/dL (ref 8.6–10.3)
Chloride: 107 mmol/L (ref 98–110)
Creat: 1.19 mg/dL (ref 0.70–1.35)
Globulin: 2 g/dL (calc) (ref 1.9–3.7)
Glucose, Bld: 92 mg/dL (ref 65–99)
Potassium: 4.6 mmol/L (ref 3.5–5.3)
Sodium: 143 mmol/L (ref 135–146)
Total Bilirubin: 0.6 mg/dL (ref 0.2–1.2)
Total Protein: 6.4 g/dL (ref 6.1–8.1)
eGFR: 67 mL/min/{1.73_m2} (ref >=60)

## 2022-06-21 LAB — URIC ACID: Uric Acid, Serum: 5.4 mg/dL (ref 4.0–8.0)

## 2022-06-21 NOTE — Progress Notes (Signed)
Hemoglobin is borderline low. Absolute eosinophils are elevated-likely secondary to allergies.  CMP WNL Uric acid WNL

## 2022-07-05 DIAGNOSIS — E119 Type 2 diabetes mellitus without complications: Secondary | ICD-10-CM | POA: Diagnosis not present

## 2022-07-08 DIAGNOSIS — K5904 Chronic idiopathic constipation: Secondary | ICD-10-CM | POA: Diagnosis not present

## 2022-07-08 DIAGNOSIS — R140 Abdominal distension (gaseous): Secondary | ICD-10-CM | POA: Diagnosis not present

## 2022-07-08 DIAGNOSIS — R1013 Epigastric pain: Secondary | ICD-10-CM | POA: Diagnosis not present

## 2022-08-27 DIAGNOSIS — F419 Anxiety disorder, unspecified: Secondary | ICD-10-CM | POA: Diagnosis not present

## 2022-08-27 DIAGNOSIS — E663 Overweight: Secondary | ICD-10-CM | POA: Diagnosis not present

## 2022-08-27 DIAGNOSIS — Z6829 Body mass index (BMI) 29.0-29.9, adult: Secondary | ICD-10-CM | POA: Diagnosis not present

## 2022-12-02 NOTE — Progress Notes (Signed)
Office Visit Note  Patient: Peter Burch             Date of Birth: Oct 23, 1955           MRN: [id number]             PCP: Assunta Found, MD Referring: Assunta Found, MD Visit Date: 12/12/2022 Occupation: @GUAROCC @  Subjective:  Intermittent right knee pain  History of Present Illness: Peter Burch is a 67 y.o. male with gout and osteoarthritis.  He is denies having a gout flare.  He has been taking allopurinol 300 mg daily.  He takes colchicine only on as needed basis and did not have to use it.  He continues to have some stiffness in his hands.  He states he has been going to the gym on a regular basis.  He has been having infrequent discomfort in his right knee joint which he reports on the scale of 0-10 about 2-3.  He states he has discomfort when he is walking on an incline.  He states he has had cortisone injections in the past which did not help much.  He has been using topical Voltaren gel.  He is also using a sleeve brace.    Activities of Daily Living:  Patient reports morning stiffness for 0  none .   Patient Reports nocturnal pain.  Difficulty dressing/grooming: Denies Difficulty climbing stairs: Denies Difficulty getting out of chair: Denies Difficulty using hands for taps, buttons, cutlery, and/or writing: Denies  Review of Systems  Constitutional:  Negative for fatigue.  HENT:  Negative for mouth sores and mouth dryness.   Eyes:  Negative for dryness.  Respiratory:  Negative for shortness of breath.   Cardiovascular:  Negative for chest pain and palpitations.  Gastrointestinal:  Positive for constipation. Negative for blood in stool and diarrhea.  Endocrine: Negative for increased urination.  Genitourinary:  Negative for involuntary urination.  Musculoskeletal:  Positive for joint pain and joint pain. Negative for gait problem, joint swelling, myalgias, muscle weakness, morning stiffness, muscle tenderness and myalgias.  Skin:  Negative for color change, rash,  hair loss and sensitivity to sunlight.  Allergic/Immunologic: Negative for susceptible to infections.  Neurological:  Negative for dizziness and headaches.  Hematological:  Negative for swollen glands.  Psychiatric/Behavioral:  Negative for depressed mood and sleep disturbance. The patient is not nervous/anxious.     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   Idiopathic chronic gout of multiple sites without tophus 01/15/2016   Mixed hyperlipidemia 09/01/2012   Atherosclerosis of coronary artery of native heart without angina pectoris 09/01/2012    Past Medical History:  Diagnosis Date   CAD (coronary artery disease)    Constipation    Gout    Hypercholesteremia    Sinus bradycardia     Family History  Problem Relation Age of Onset   Heart attack Mother    Cancer Father        lung   Past Surgical History:  Procedure Laterality Date   CARDIAC CATHETERIZATION  05/16/2006   40% LAD lesion   COLONOSCOPY N/A 01/19/2015   Procedure: COLONOSCOPY;  Surgeon: Malissa Hippo, MD;  Location: [facility];  Service: Endoscopy;  Laterality: N/A;  1030   NM MYOCAR PERF WALL MOTION  07/31/2009   mod to severe defect due to infarct/scar w/mild perinfarct ischemia   US ECHOCARDIOGRAPHY  07/31/2009   impaired LV relaxation   Social History   Social History Narrative   Not on file   Immunization History  Administered Date(s) Administered   [employer] Vaccination 01/12/2019, 02/09/2019, 11/11/2019     Objective: Vital Signs: BP 109/62 (BP Location: Left Arm, Patient Position: Sitting, Cuff Size: Normal)   Pulse 94   Resp 16   Ht 6\' 3"  (1.905 m)   Wt 244 lb (110.7 kg)   BMI 30.50 kg/m     Physical Exam Vitals and nursing note reviewed.  Constitutional:      Appearance: He is well-developed.  HENT:     Head: Normocephalic and atraumatic.  Eyes:     Conjunctiva/sclera: Conjunctivae normal.     Pupils: Pupils are equal, round, and reactive to light.  Cardiovascular:     Rate and Rhythm: Normal rate and regular rhythm.     Heart sounds: Normal heart sounds.  Pulmonary:     Effort: Pulmonary effort is normal.     Breath sounds: Normal breath sounds.  Abdominal:     General: Bowel sounds are normal.     Palpations: Abdomen is soft.  Musculoskeletal:     Cervical back: Normal range of motion and neck supple.  Skin:    General: Skin is warm and dry.     Capillary Refill: Capillary refill takes less than 2 seconds.  Neurological:     Mental Status: He is alert and oriented to person, place, and time.  Psychiatric:        Behavior: Behavior normal.      Musculoskeletal Exam: Cervical and lumbar spine were in good range of motion without discomfort.  Shoulders, elbows, wrist joints, MCPs PIPs and DIPs with good range of motion.  He had bilateral PIP and DIP thickening.  He had limited extension of bilateral middle fingers due to Dupuytren's contractures.  Hip joints and knee joints in good range of motion without any warmth swelling or effusion.  There was no tenderness over ankles or MTPs.  CDAI Exam: CDAI Score: -- Patient Global: --; Provider Global: -- Swollen: --; Tender: -- Joint Exam 12/12/2022   No joint exam has been documented for this visit   There is currently no information documented on the homunculus. Go to the Rheumatology activity and complete the homunculus joint exam.  Investigation: No additional findings.  Imaging: No results found.  Recent Labs: Lab Results  Component Value Date   WBC 8.3 06/20/2022   HGB 12.3 (L) 06/20/2022   PLT 210 06/20/2022   NA 143 06/20/2022   K 4.6 06/20/2022   CL 107 06/20/2022   CO2 27 06/20/2022    GLUCOSE 92 06/20/2022   BUN 14 06/20/2022   CREATININE 1.19 06/20/2022   BILITOT 0.6 06/20/2022   ALKPHOS 81 09/02/2021   AST 18 06/20/2022   ALT 17 06/20/2022   PROT 6.4 06/20/2022   ALBUMIN 4.2 09/02/2021   CALCIUM 9.5 06/20/2022   GFRAA 69 01/20/2020    Speciality Comments: No specialty comments available.  Procedures:  No procedures performed Allergies: Statins   Assessment / Plan:     Visit Diagnoses: Idiopathic chronic gout of multiple sites without tophus -patient denies having a gout flare since the last visit.  He denies any interruption in the treatment.  He has been on allopurinol 300 mg 1  tablet by mouth daily and Colchicine 0.6 mg 1 capsule by mouth daily as needed. uric acid: 5.4 on 06/20/2022 -I will check uric acid level today.  Plan: Uric acid  Medication monitoring encounter -CBC and CMP were normal in June..  Will check CBC and CMP today.  Plan: CBC with Differential/Platelet, COMPLETE METABOLIC PANEL WITH GFR  Primary osteoarthritis of both hands-bilateral PIP and DIP thickening was noted.  Joint protection muscle strengthening was advised.  Dupuytren's contracture of both hands-stretching exercises were demonstrated.  Chronic pain of right knee-x-rays from December 2023 were reviewed.  He had moderate osteoarthritis and chondromalacia patella.  Lower extremity muscle strengthening exercises were advised.  He has been exercising and going to the gym on a regular basis.  No warmth swelling or effusion was noted.  He is also using a sleeve brace and Voltaren gel.  I discussed the option of possible viscosupplement injections in the future.  He will notify when he is ready.  Primary osteoarthritis of both knees-is intermittent chronic discomfort.  History of renal stone-no recurrent Raynauds.  Dyslipidemia-he is on Repatha injections.  History of coronary artery disease  History of hyperlipidemia  Orders: Orders Placed This Encounter  Procedures   CBC with  Differential/Platelet   COMPLETE METABOLIC PANEL WITH GFR   Uric acid   No orders of the defined types were placed in this encounter.     Follow-Up Instructions: Return in about 6 months (around 06/12/2023) for Osteoarthritis, Gout.   Pollyann Savoy, MD  Note - This record has been created using Animal nutritionist.  Chart creation errors have been sought, but may not always  have been located. Such creation errors do not reflect on  the standard of medical care.

## 2022-12-12 ENCOUNTER — Encounter: Payer: Self-pay | Admitting: Rheumatology

## 2022-12-12 ENCOUNTER — Ambulatory Visit: Payer: PPO | Attending: Rheumatology | Admitting: Rheumatology

## 2022-12-12 VITALS — BP 109/62 | HR 94 | Resp 16 | Ht 75.0 in | Wt 244.0 lb

## 2022-12-12 DIAGNOSIS — Z5181 Encounter for therapeutic drug level monitoring: Secondary | ICD-10-CM | POA: Diagnosis not present

## 2022-12-12 DIAGNOSIS — M1A09X0 Idiopathic chronic gout, multiple sites, without tophus (tophi): Secondary | ICD-10-CM | POA: Diagnosis not present

## 2022-12-12 DIAGNOSIS — Z8679 Personal history of other diseases of the circulatory system: Secondary | ICD-10-CM

## 2022-12-12 DIAGNOSIS — G8929 Other chronic pain: Secondary | ICD-10-CM | POA: Diagnosis not present

## 2022-12-12 DIAGNOSIS — M19041 Primary osteoarthritis, right hand: Secondary | ICD-10-CM

## 2022-12-12 DIAGNOSIS — M25561 Pain in right knee: Secondary | ICD-10-CM | POA: Diagnosis not present

## 2022-12-12 DIAGNOSIS — Z87442 Personal history of urinary calculi: Secondary | ICD-10-CM

## 2022-12-12 DIAGNOSIS — M170 Bilateral primary osteoarthritis of knee: Secondary | ICD-10-CM

## 2022-12-12 DIAGNOSIS — M19042 Primary osteoarthritis, left hand: Secondary | ICD-10-CM

## 2022-12-12 DIAGNOSIS — Z8639 Personal history of other endocrine, nutritional and metabolic disease: Secondary | ICD-10-CM

## 2022-12-12 DIAGNOSIS — M720 Palmar fascial fibromatosis [Dupuytren]: Secondary | ICD-10-CM

## 2022-12-12 DIAGNOSIS — E785 Hyperlipidemia, unspecified: Secondary | ICD-10-CM | POA: Diagnosis not present

## 2022-12-12 NOTE — Patient Instructions (Addendum)
Hand Exercises Hand exercises can be helpful for almost anyone. They can strengthen your hands and improve flexibility and movement. The exercises can also increase blood flow to the hands. These results can make your work and daily tasks easier for you. Hand exercises can be especially helpful for people who have joint pain from arthritis or nerve damage from using their hands over and over. These exercises can also help people who injure a hand. Exercises Most of these hand exercises are gentle stretching and motion exercises. It is usually safe to do them often throughout the day. Warming up your hands before exercise may help reduce stiffness. You can do this with gentle massage or by placing your hands in warm water for 10-15 minutes. It is normal to feel some stretching, pulling, tightness, or mild discomfort when you begin new exercises. In time, this will improve. Remember to always be careful and stop right away if you feel sudden, very bad pain or your pain gets worse. You want to get better and be safe. Ask your health care provider which exercises are safe for you. Do exercises exactly as told by your provider and adjust them as told. Do not begin these exercises until told by your provider. Knuckle bend or "claw" fist  Stand or sit with your arm, hand, and all five fingers pointed straight up. Make sure to keep your wrist straight. Gently bend your fingers down toward your palm until the tips of your fingers are touching your palm. Keep your big knuckle straight and only bend the small knuckles in your fingers. Hold this position for 10 seconds. Straighten your fingers back to your starting position. Repeat this exercise 5-10 times with each hand. Full finger fist  Stand or sit with your arm, hand, and all five fingers pointed straight up. Make sure to keep your wrist straight. Gently bend your fingers into your palm until the tips of your fingers are touching the middle of your  palm. Hold this position for 10 seconds. Extend your fingers back to your starting position, stretching every joint fully. Repeat this exercise 5-10 times with each hand. Straight fist  Stand or sit with your arm, hand, and all five fingers pointed straight up. Make sure to keep your wrist straight. Gently bend your fingers at the big knuckle, where your fingers meet your hand, and at the middle knuckle. Keep the knuckle at the tips of your fingers straight and try to touch the bottom of your palm. Hold this position for 10 seconds. Extend your fingers back to your starting position, stretching every joint fully. Repeat this exercise 5-10 times with each hand. Tabletop  Stand or sit with your arm, hand, and all five fingers pointed straight up. Make sure to keep your wrist straight. Gently bend your fingers at the big knuckle, where your fingers meet your hand, as far down as you can. Keep the small knuckles in your fingers straight. Think of forming a tabletop with your fingers. Hold this position for 10 seconds. Extend your fingers back to your starting position, stretching every joint fully. Repeat this exercise 5-10 times with each hand. Finger spread  Place your hand flat on a table with your palm facing down. Make sure your wrist stays straight. Spread your fingers and thumb apart from each other as far as you can until you feel a gentle stretch. Hold this position for 10 seconds. Bring your fingers and thumb tight together again. Hold this position for 10 seconds. Repeat   this exercise 5-10 times with each hand. Making circles  Stand or sit with your arm, hand, and all five fingers pointed straight up. Make sure to keep your wrist straight. Make a circle by touching the tip of your thumb to the tip of your index finger. Hold for 10 seconds. Then open your hand wide. Repeat this motion with your thumb and each of your fingers. Repeat this exercise 5-10 times with each hand. Thumb  motion  Sit with your forearm resting on a table and your wrist straight. Your thumb should be facing up toward the ceiling. Keep your fingers relaxed as you move your thumb. Lift your thumb up as high as you can toward the ceiling. Hold for 10 seconds. Bend your thumb across your palm as far as you can, reaching the tip of your thumb for the small finger (pinkie) side of your palm. Hold for 10 seconds. Repeat this exercise 5-10 times with each hand. Grip strengthening  Hold a stress ball or other soft ball in the middle of your hand. Slowly increase the pressure, squeezing the ball as much as you can without causing pain. Think of bringing the tips of your fingers into the middle of your palm. All of your finger joints should bend when doing this exercise. Hold your squeeze for 10 seconds, then relax. Repeat this exercise 5-10 times with each hand. Contact a health care provider if: Your hand pain or discomfort gets much worse when you do an exercise. Your hand pain or discomfort does not improve within 2 hours after you exercise. If you have either of these problems, stop doing these exercises right away. Do not do them again unless your provider says that you can. Get help right away if: You develop sudden, severe hand pain or swelling. If this happens, stop doing these exercises right away. Do not do them again unless your provider says that you can. This information is not intended to replace advice given to you by your health care provider. Make sure you discuss any questions you have with your health care provider. Document Revised: 01/08/2022 Document Reviewed: 01/08/2022 Elsevier Patient Education  2024 [employer] Exercises for Chronic Knee Pain Chronic knee pain is pain that lasts longer than 3 months. For most people with chronic knee pain, exercise and weight loss is an important part of treatment. Your health care provider may want you to focus on: Making the muscles that  support your knee stronger. This can take pressure off your knee and reduce pain. Preventing knee stiffness. How far you can move your knee, keeping it there or making it farther. Losing weight (if this applies) to take pressure off your knee, lower your risk for injury, and make it easier for you to exercise. Your provider will help you make an exercise program that fits your needs and physical abilities. Below are simple, low-impact exercises you can do at home. Ask your provider or physical therapist how often you should do your exercise program and how many times to repeat each exercise. General safety tips  Get your provider's approval before doing any exercises. Start slowly and stop any time you feel pain. Do not exercise if your knee pain is flaring up. Warm up first. Stretching a cold muscle can cause an injury. Do 5-10 minutes of easy movement or light stretching before beginning your exercises. Do 5-10 minutes of low-impact activity (like walking or cycling) before starting strengthening exercises. Contact your provider any time you have pain during   or after exercising. Exercise can cause discomfort but should not be painful. It is normal to be a little stiff or sore after exercising. Stretching and range-of-motion exercises Front thigh stretch  Stand up straight and support your body by holding on to a chair or resting one hand on a wall. With your legs straight and close together, bend one knee to lift your heel up toward your butt. Using one hand for support, grab your ankle with your free hand. Pull your foot up closer toward your butt to feel the stretch in front of your thigh. Hold the stretch for 30 seconds. Repeat __________ times. Complete this exercise __________ times a day. Back thigh stretch  Sit on the floor with your back straight and your legs out straight in front of you. Place the palms of your hands on the floor and slide them toward your feet as you bend at the  hip. Try to touch your nose to your knees and feel the stretch in the back of your thighs. Hold for 30 seconds. Repeat __________ times. Complete this exercise __________ times a day. Calf stretch  Stand facing a wall. Place the palms of your hands flat against the wall, arms extended, and lean slightly against the wall. Get into a lunge position with one leg bent at the knee and the other leg stretched out straight behind you. Keep both feet facing the wall and increase the bend in your knee while keeping the heel of the other leg flat on the ground. You should feel the stretch in your calf. Hold for 30 seconds. Repeat __________ times. Complete this exercise __________ times a day. Strengthening exercises Straight leg lift  Lie on your back with one knee bent and the other leg out straight. Slowly lift the straight leg without bending the knee. Lift until your foot is about 12 inches (30 cm) off the floor. Hold for 3-5 seconds and slowly lower your leg. Repeat __________ times. Complete this exercise __________ times a day. Single leg dip  Stand between two chairs and put both hands on the backs of the chairs for support. Extend one leg out straight with your body weight resting on the heel of the standing leg. Slowly bend your standing knee to dip your body to the level that is comfortable for you. Hold for 3-5 seconds. Repeat __________ times. Complete this exercise __________ times a day. Hamstring curls  Stand straight, knees close together, facing the back of a chair. Hold on to the back of a chair with both hands. Keep one leg straight. Bend the other knee while bringing the heel up toward the butt until the knee is bent at a 90-degree angle (right angle). Hold for 3-5 seconds. Repeat __________ times. Complete this exercise __________ times a day. Wall squat  Stand straight with your back, hips, and head against a wall. Step forward one foot at a time with your back  still against the wall. Your feet should be 2 feet (61 cm) from the wall at shoulder width. Keeping your back, hips, and head against the wall, slide down the wall to as close to a sitting position as you can get. Hold for 5-10 seconds, then slowly slide back up. Repeat __________ times. Complete this exercise __________ times a day. Step-ups  Stand in front of a sturdy platform or stool that is about 6 inches (15 cm) high. Slowly step up with your left / right foot, keeping your knee in line with your hip   and foot. Do not let your knee bend so far that you cannot see your toes. Hold on to a chair for balance, but do not use it for support. Slowly unlock your knee and lower yourself to the starting position. Repeat __________ times. Complete this exercise __________ times a day. Contact a health care provider if: Your exercises cause pain. Your pain is worse after you exercise. Your pain prevents you from doing your exercises. This information is not intended to replace advice given to you by your health care provider. Make sure you discuss any questions you have with your health care provider. Document Revised: 01/08/2022 Document Reviewed: 01/08/2022 Elsevier Patient Education  2024 [employer]  

## 2022-12-13 ENCOUNTER — Other Ambulatory Visit: Payer: Self-pay | Admitting: *Deleted

## 2022-12-13 DIAGNOSIS — Z5181 Encounter for therapeutic drug level monitoring: Secondary | ICD-10-CM

## 2022-12-13 DIAGNOSIS — M1A09X0 Idiopathic chronic gout, multiple sites, without tophus (tophi): Secondary | ICD-10-CM

## 2022-12-13 LAB — COMPLETE METABOLIC PANEL WITH GFR
AG Ratio: 2.2 (calc) (ref 1.0–2.5)
ALT: 14 U/L (ref 9–46)
AST: 14 U/L (ref 10–35)
Albumin: 4.2 g/dL (ref 3.6–5.1)
Alkaline phosphatase (APISO): 72 U/L (ref 35–144)
BUN/Creatinine Ratio: 10 (calc) (ref 6–22)
BUN: 14 mg/dL (ref 7–25)
CO2: 26 mmol/L (ref 20–32)
Calcium: 9 mg/dL (ref 8.6–10.3)
Chloride: 108 mmol/L (ref 98–110)
Creat: 1.42 mg/dL — ABNORMAL HIGH (ref 0.70–1.35)
Globulin: 1.9 g/dL (ref 1.9–3.7)
Glucose, Bld: 93 mg/dL (ref 65–99)
Potassium: 4 mmol/L (ref 3.5–5.3)
Sodium: 141 mmol/L (ref 135–146)
Total Bilirubin: 0.4 mg/dL (ref 0.2–1.2)
Total Protein: 6.1 g/dL (ref 6.1–8.1)
eGFR: 53 mL/min/{1.73_m2} — ABNORMAL LOW (ref >=60)

## 2022-12-13 LAB — CBC WITH DIFFERENTIAL/PLATELET
Absolute Lymphocytes: 1520 {cells}/uL (ref 850–3900)
Absolute Monocytes: 920 {cells}/uL (ref 200–950)
Basophils Absolute: 60 {cells}/uL (ref 0–200)
Basophils Relative: 0.6 %
Eosinophils Absolute: 830 {cells}/uL — ABNORMAL HIGH (ref 15–500)
Eosinophils Relative: 8.3 %
HCT: 37.6 % — ABNORMAL LOW (ref 38.5–50.0)
Hemoglobin: 11.8 g/dL — ABNORMAL LOW (ref 13.2–17.1)
MCH: 25.2 pg — ABNORMAL LOW (ref 27.0–33.0)
MCHC: 31.4 g/dL — ABNORMAL LOW (ref 32.0–36.0)
MCV: 80.2 fL (ref 80.0–100.0)
MPV: 11.8 fL (ref 7.5–12.5)
Monocytes Relative: 9.2 %
Neutro Abs: 6670 {cells}/uL (ref 1500–7800)
Neutrophils Relative %: 66.7 %
Platelets: 213 10*3/uL (ref 140–400)
RBC: 4.69 10*6/uL (ref 4.20–5.80)
RDW: 14.6 % (ref 11.0–15.0)
Total Lymphocyte: 15.2 %
WBC: 10 10*3/uL (ref 3.8–10.8)

## 2022-12-13 LAB — URIC ACID: Uric Acid, Serum: 8.4 mg/dL — ABNORMAL HIGH (ref 4.0–8.0)

## 2022-12-13 NOTE — Progress Notes (Signed)
Hemoglobin is low at 11.8.  Patient should see his PCP regarding evaluation of low hemoglobin.  He should also take multivitamin with iron.  Creatinine is elevated at 1.42.  Patient should avoid all NSAIDs.  He should increase water intake.  I would recommend repeat BMP in 1 month.

## 2023-01-07 DIAGNOSIS — I25118 Atherosclerotic heart disease of native coronary artery with other forms of angina pectoris: Secondary | ICD-10-CM | POA: Diagnosis not present

## 2023-01-07 DIAGNOSIS — F411 Generalized anxiety disorder: Secondary | ICD-10-CM | POA: Diagnosis not present

## 2023-01-07 DIAGNOSIS — G720 Drug-induced myopathy: Secondary | ICD-10-CM | POA: Diagnosis not present

## 2023-01-09 ENCOUNTER — Other Ambulatory Visit: Payer: Self-pay | Admitting: *Deleted

## 2023-01-09 DIAGNOSIS — Z5181 Encounter for therapeutic drug level monitoring: Secondary | ICD-10-CM | POA: Diagnosis not present

## 2023-01-09 DIAGNOSIS — M1A09X0 Idiopathic chronic gout, multiple sites, without tophus (tophi): Secondary | ICD-10-CM

## 2023-01-10 ENCOUNTER — Telehealth: Payer: Self-pay

## 2023-01-10 DIAGNOSIS — R944 Abnormal results of kidney function studies: Secondary | ICD-10-CM

## 2023-01-10 DIAGNOSIS — R7989 Other specified abnormal findings of blood chemistry: Secondary | ICD-10-CM

## 2023-01-10 LAB — BASIC METABOLIC PANEL WITH GFR
BUN/Creatinine Ratio: 12 (calc) (ref 6–22)
BUN: 20 mg/dL (ref 7–25)
CO2: 29 mmol/L (ref 20–32)
Calcium: 9.5 mg/dL (ref 8.6–10.3)
Chloride: 104 mmol/L (ref 98–110)
Creat: 1.73 mg/dL — ABNORMAL HIGH (ref 0.70–1.35)
Glucose, Bld: 92 mg/dL (ref 65–99)
Potassium: 4.4 mmol/L (ref 3.5–5.3)
Sodium: 142 mmol/L (ref 135–146)
eGFR: 42 mL/min/{1.73_m2} — ABNORMAL LOW (ref >=60)

## 2023-01-10 NOTE — Progress Notes (Signed)
 Creatinine is higher than before at 1.73 GFR is low.  Patient should avoid using topical Voltaren gel and any NSAIDs.  Please forward results to his PCP and refer him to nephrology.

## 2023-01-10 NOTE — Telephone Encounter (Signed)
-----   Message from [facility] sent at 01/10/2023  7:54 AM EST ----- Creatinine is higher than before at 1.73 GFR is low.  Patient should avoid using topical Voltaren gel and any NSAIDs.  Please forward results to his PCP and refer him to nephrology.

## 2023-01-13 ENCOUNTER — Other Ambulatory Visit (HOSPITAL_COMMUNITY): Payer: Self-pay

## 2023-01-13 ENCOUNTER — Telehealth: Payer: Self-pay | Admitting: Pharmacist

## 2023-01-13 ENCOUNTER — Telehealth: Payer: Self-pay | Admitting: Pharmacy Technician

## 2023-01-13 NOTE — Telephone Encounter (Signed)
 Pharmacy Patient Advocate Encounter  Received notification from [facility] [employer] that Prior Authorization for  Repatha  SureClick 140MG /ML auto-injectors  has been APPROVED from 01/13/23 to 01/13/24. Spoke to pharmacy to process.Copay is $47.00.    PA #/Case ID/Reference #: [id number]

## 2023-01-13 NOTE — Telephone Encounter (Signed)
 Pharmacy Patient Advocate Encounter   Received notification from Pt Calls Messages that prior authorization for Repatha  SureClick 140MG /ML auto-injectors is required/requested.   Insurance verification completed.   The patient is insured through [facility] [employer] .   Per test claim: PA required; PA submitted to above mentioned insurance via CoverMyMeds Key/confirmation #/EOC [id number] Status is pending

## 2023-01-13 NOTE — Telephone Encounter (Signed)
 PA renewal for Repatha sent to the pool

## 2023-01-13 NOTE — Telephone Encounter (Signed)
 PA request has been Submitted. New Encounter created for follow up. For additional info see Pharmacy Prior Auth telephone encounter from 01/13/23.

## 2023-01-16 DIAGNOSIS — G4733 Obstructive sleep apnea (adult) (pediatric): Secondary | ICD-10-CM | POA: Diagnosis not present

## 2023-01-16 DIAGNOSIS — N1831 Chronic kidney disease, stage 3a: Secondary | ICD-10-CM | POA: Diagnosis not present

## 2023-01-16 DIAGNOSIS — Z6830 Body mass index (BMI) 30.0-30.9, adult: Secondary | ICD-10-CM | POA: Diagnosis not present

## 2023-01-16 DIAGNOSIS — E6609 Other obesity due to excess calories: Secondary | ICD-10-CM | POA: Diagnosis not present

## 2023-01-16 DIAGNOSIS — G9332 Myalgic encephalomyelitis/chronic fatigue syndrome: Secondary | ICD-10-CM | POA: Diagnosis not present

## 2023-01-16 DIAGNOSIS — Z0001 Encounter for general adult medical examination with abnormal findings: Secondary | ICD-10-CM | POA: Diagnosis not present

## 2023-01-16 DIAGNOSIS — D518 Other vitamin B12 deficiency anemias: Secondary | ICD-10-CM | POA: Diagnosis not present

## 2023-01-16 DIAGNOSIS — K5904 Chronic idiopathic constipation: Secondary | ICD-10-CM | POA: Diagnosis not present

## 2023-01-16 DIAGNOSIS — D649 Anemia, unspecified: Secondary | ICD-10-CM | POA: Diagnosis not present

## 2023-01-16 DIAGNOSIS — K219 Gastro-esophageal reflux disease without esophagitis: Secondary | ICD-10-CM | POA: Diagnosis not present

## 2023-01-16 DIAGNOSIS — E559 Vitamin D deficiency, unspecified: Secondary | ICD-10-CM | POA: Diagnosis not present

## 2023-01-16 DIAGNOSIS — Z125 Encounter for screening for malignant neoplasm of prostate: Secondary | ICD-10-CM | POA: Diagnosis not present

## 2023-01-16 DIAGNOSIS — I25118 Atherosclerotic heart disease of native coronary artery with other forms of angina pectoris: Secondary | ICD-10-CM | POA: Diagnosis not present

## 2023-01-16 DIAGNOSIS — E790 Hyperuricemia without signs of inflammatory arthritis and tophaceous disease: Secondary | ICD-10-CM | POA: Diagnosis not present

## 2023-01-16 DIAGNOSIS — Z1331 Encounter for screening for depression: Secondary | ICD-10-CM | POA: Diagnosis not present

## 2023-01-30 ENCOUNTER — Other Ambulatory Visit (HOSPITAL_COMMUNITY): Payer: Self-pay | Admitting: Nephrology

## 2023-01-30 DIAGNOSIS — K5904 Chronic idiopathic constipation: Secondary | ICD-10-CM | POA: Diagnosis not present

## 2023-01-30 DIAGNOSIS — D631 Anemia in chronic kidney disease: Secondary | ICD-10-CM | POA: Diagnosis not present

## 2023-01-30 DIAGNOSIS — R8290 Unspecified abnormal findings in urine: Secondary | ICD-10-CM | POA: Diagnosis not present

## 2023-01-30 DIAGNOSIS — R800 Isolated proteinuria: Secondary | ICD-10-CM | POA: Diagnosis not present

## 2023-01-30 DIAGNOSIS — N1831 Chronic kidney disease, stage 3a: Secondary | ICD-10-CM | POA: Diagnosis not present

## 2023-02-04 ENCOUNTER — Ambulatory Visit (HOSPITAL_COMMUNITY)
Admission: RE | Admit: 2023-02-04 | Discharge: 2023-02-04 | Disposition: A | Discharge status: Home / Self Care | Payer: PPO | Source: Ambulatory Visit | Attending: Nephrology | Admitting: Nephrology

## 2023-02-04 DIAGNOSIS — N281 Cyst of kidney, acquired: Secondary | ICD-10-CM | POA: Diagnosis not present

## 2023-02-04 DIAGNOSIS — N1831 Chronic kidney disease, stage 3a: Secondary | ICD-10-CM | POA: Insufficient documentation

## 2023-02-05 DIAGNOSIS — Z1211 Encounter for screening for malignant neoplasm of colon: Secondary | ICD-10-CM | POA: Diagnosis not present

## 2023-02-06 DIAGNOSIS — R800 Isolated proteinuria: Secondary | ICD-10-CM | POA: Diagnosis not present

## 2023-02-06 DIAGNOSIS — D631 Anemia in chronic kidney disease: Secondary | ICD-10-CM | POA: Diagnosis not present

## 2023-02-06 DIAGNOSIS — N1831 Chronic kidney disease, stage 3a: Secondary | ICD-10-CM | POA: Diagnosis not present

## 2023-02-06 DIAGNOSIS — R8290 Unspecified abnormal findings in urine: Secondary | ICD-10-CM | POA: Diagnosis not present

## 2023-02-12 ENCOUNTER — Other Ambulatory Visit: Payer: Self-pay | Admitting: *Deleted

## 2023-02-12 MED ORDER — ALLOPURINOL 300 MG PO TABS: ORAL_TABLET | ORAL | 0 refills | Status: DC

## 2023-02-12 NOTE — Telephone Encounter (Signed)
 Last Fill: 06/05/2022  Labs: 12/12/2022 Uric Acid 8.4,  Hemoglobin is low at 11.8.  Patient should see his PCP regarding evaluation of low hemoglobin.  He should also take multivitamin with iron.  Creatinine is elevated at 1.42.  Patient should avoid all NSAIDs.  He should increase water  intake.  I would recommend repeat BMP in 1 month 01/09/2023  Creatinine is higher than before at 1.73 GFR is low.  Patient should avoid using topical Voltaren  gel and any NSAIDs.  Please forward results to his PCP and refer him to nephrology.   Next Visit: 06/10/2023  Last Visit: 12/12/2022  DX:  Idiopathic chronic gout of multiple sites without tophus   Current Dose per office note 12/12/2022: allopurinol  300 mg 1 tablet by mouth daily    Okay to refill Allopurinol ?

## 2023-02-20 DIAGNOSIS — D508 Other iron deficiency anemias: Secondary | ICD-10-CM | POA: Diagnosis not present

## 2023-02-20 DIAGNOSIS — N1831 Chronic kidney disease, stage 3a: Secondary | ICD-10-CM | POA: Diagnosis not present

## 2023-03-05 ENCOUNTER — Encounter: Payer: Self-pay | Admitting: Cardiovascular Disease

## 2023-03-05 ENCOUNTER — Ambulatory Visit: Payer: PPO | Attending: Cardiovascular Disease | Admitting: Cardiovascular Disease

## 2023-03-05 VITALS — BP 100/60 | HR 66 | Ht 75.0 in | Wt 240.8 lb

## 2023-03-05 DIAGNOSIS — E7800 Pure hypercholesterolemia, unspecified: Secondary | ICD-10-CM | POA: Diagnosis not present

## 2023-03-05 DIAGNOSIS — R7303 Prediabetes: Secondary | ICD-10-CM

## 2023-03-05 DIAGNOSIS — Z8739 Personal history of other diseases of the musculoskeletal system and connective tissue: Secondary | ICD-10-CM | POA: Diagnosis not present

## 2023-03-05 DIAGNOSIS — I2510 Atherosclerotic heart disease of native coronary artery without angina pectoris: Secondary | ICD-10-CM | POA: Diagnosis not present

## 2023-03-05 DIAGNOSIS — I493 Ventricular premature depolarization: Secondary | ICD-10-CM

## 2023-03-05 DIAGNOSIS — G4733 Obstructive sleep apnea (adult) (pediatric): Secondary | ICD-10-CM

## 2023-03-05 NOTE — Patient Instructions (Signed)
 Medication Instructions:  Your physician recommends that you continue on your current medications as directed. Please refer to the Current Medication list given to you today.    *If you need a refill on your cardiac medications before your next appointment, please call your pharmacy*   Lab Work: None    If you have labs (blood work) drawn today and your tests are completely normal, you will receive your results only by: MyChart Message (if you have MyChart) OR A paper copy in the mail If you have any lab test that is abnormal or we need to change your treatment, we will call you to review the results.   Testing/Procedures:    Follow-Up: At [facility], you and your health needs are our priority.  As part of our continuing mission to provide you with exceptional heart care, we have created designated Provider Care Teams.  These Care Teams include your primary Cardiologist (physician) and Advanced Practice Providers (APPs -  Physician Assistants and Nurse Practitioners) who all work together to provide you with the care you need, when you need it.  We recommend signing up for the patient portal called "MyChart".  Sign up information is provided on this After Visit Summary.  MyChart is used to connect with patients for Virtual Visits (Telemedicine).  Patients are able to view lab/test results, encounter notes, upcoming appointments, etc.  Non-urgent messages can be sent to your provider as well.   To learn more about what you can do with MyChart, go to [email or web address].    Your next appointment:   1 year(s)  The format for your next appointment:   In Person  Provider:   Thurmon Fair, MD    Other Instructions

## 2023-03-05 NOTE — Progress Notes (Signed)
 Cardiology Office Note    Date:  03/12/2023   ID:  MASIN SHATTO, DOB [birth year], MRN [id number]  PCP:  Assunta Found, MD  Cardiologist:   Thurmon Fair, MD   Chief complaint: PVCs   History of Present Illness:  Peter Burch is a 68 y.o. male with known severe coronary atherosclerosis ((coronary calcium score 1351, 95th percentile by CT angiography 2021) without significant coronary stenosis  (40% proximal LAD calcified lesion by cath 2008, heavily calcified 50% stenosis of mid LAD by CT angiography 2021, CT FFR 0.84), without angina pectoris, false positive inferior wall defect on nuclear stress test (2008).    He has hyperlipidemia but also statin myopathy, able to tolerate Zetia and Repatha was ineffective so he is currently receiving Repatha.  He has a previous history of episodes of orthostatic dizziness and palpitations.  In the past, his palpitations have correlated with PVCs on ECG.  He has never experienced full-blown syncope.  He is doing well from a cardiovascular point of view, but is currently undergoing a workup for anemia.  He has moderate chronic kidney disease and sees Dr. Thedore Mins.  Continues to exercise regularly at the gym without complaints of angina or dyspnea with activity, 5 days a week.  Denies palpitations, dizziness, syncope, claudication.  He has not had any recent gout attacks (sees Dr. Corliss Skains, takes allopurinol).  He has sleep apnea on CPAP, uses the device and denies daytime hypersomnolence.  Labs performed last month showed cholesterol 138, HDL 45, LDL 65, triglycerides 168, hemoglobin A1c has been borderline in prediabetes range.  Most recent creatinine was 1.4 with normal electrolytes, liver function tests and thyroid function tests.   Past Medical History:  Diagnosis Date   CAD (coronary artery disease)    Constipation    Gout    Hypercholesteremia    Sinus bradycardia     Past Surgical History:  Procedure Laterality Date   CARDIAC  CATHETERIZATION  05/16/2006   40% LAD lesion   COLONOSCOPY N/A 01/19/2015   Procedure: COLONOSCOPY;  Surgeon: Malissa Hippo, MD;  Location: [facility];  Service: Endoscopy;  Laterality: N/A;  1030   NM MYOCAR PERF WALL MOTION  07/31/2009   mod to severe defect due to infarct/scar w/mild perinfarct ischemia   US ECHOCARDIOGRAPHY  07/31/2009   impaired LV relaxation    Current Medications: Outpatient Medications Prior to Visit  Medication Sig Dispense Refill   allopurinol (ZYLOPRIM) 300 MG tablet TAKE (1) TABLET BY MOUTH ONCE DAILY. 90 tablet 0   ALPRAZolam (XANAX) 0.5 MG tablet Take 0.25-0.5 tablets by mouth daily as needed for anxiety.      clotrimazole-betamethasone (LOTRISONE) cream Apply topically 2 (two) times daily as needed.     REPATHA SURECLICK 140 MG/ML SOAJ INJECT 140MG  INTO THE SKIN EVERY 14 DAYS. 2 mL 11   TRULANCE 3 MG TABS Take 1 tablet by mouth daily.     Colchicine 0.6 MG CAPS Take 0.6 mg by mouth as needed. (Patient not taking: Reported on 03/05/2023)     diclofenac Sodium (VOLTAREN) 1 % GEL Apply 2-4 grams to affected joint 4 times daily as needed. (Patient not taking: Reported on 03/05/2023) 400 g 2   esomeprazole (NEXIUM) 20 MG capsule Take 20 mg by mouth daily as needed (for GERD). (Patient not taking: Reported on 03/05/2023)     fluticasone (FLONASE) 50 MCG/ACT nasal spray Place 1 spray into the nose as needed for allergies. (Patient not taking: Reported on  03/05/2023)     MITIGARE 0.6 MG CAPS Take 0.6 mg by mouth daily. (Patient not taking: Reported on 03/05/2023) 30 capsule 1   No facility-administered medications prior to visit.     Allergies:   Statins   Social History   Socioeconomic History   Marital status: Married    Spouse name: Not on file   Number of children: Not on file   Years of education: Not on file   Highest education level: Not on file  Occupational History   Not on file  Tobacco Use   Smoking status: Never    Passive exposure: Past    Smokeless tobacco: Never  Vaping Use   Vaping status: Never Used  Substance and Sexual Activity   Alcohol use: Not Currently   Drug use: Not Currently    Comment: states he has never done any   Sexual activity: Not on file  Other Topics Concern   Not on file  Social History Narrative   Not on file   Social Drivers of Health   Financial Resource Strain: Not on file  Food Insecurity: Not on file  Transportation Needs: Not on file  Physical Activity: Not on file  Stress: Not on file  Social Connections: Not on file     Family History:  The patient's family history includes Cancer in his father; Heart attack in his mother.   ROS:   Please see the history of present illness.    ROS All other systems are reviewed and are negative.   PHYSICAL EXAM:   VS:  BP 100/60 (BP Location: Left Arm, Patient Position: Sitting, Cuff Size: Large)   Pulse 66   Wt 240 lb 12.8 oz (109.2 kg)   SpO2 96%   BMI 30.10 kg/m      General: Alert, oriented x3, no distress, overweight/ borderline obese but also appears fairly fit and muscular for his age. Head: no evidence of trauma, PERRL, EOMI, no exophtalmos or lid lag, no myxedema, no xanthelasma; normal ears, nose and oropharynx Neck: normal jugular venous pulsations and no hepatojugular reflux; brisk carotid pulses without delay and no carotid bruits Chest: clear to auscultation, no signs of consolidation by percussion or palpation, normal fremitus, symmetrical and full respiratory excursions Cardiovascular: normal position and quality of the apical impulse, regular rhythm, normal first and second heart sounds, no murmurs, rubs or gallops Abdomen: no tenderness or distention, no masses by palpation, no abnormal pulsatility or arterial bruits, normal bowel sounds, no hepatosplenomegaly Extremities: no clubbing, cyanosis or edema; 2+ radial, ulnar and brachial pulses bilaterally; 2+ right femoral, posterior tibial and dorsalis pedis pulses; 2+ left  femoral, posterior tibial and dorsalis pedis pulses; no subclavian or femoral bruits Neurological: grossly nonfocal Psych: Normal mood and affect '  Wt Readings from Last 3 Encounters:  03/05/23 240 lb 12.8 oz (109.2 kg)  12/12/22 244 lb (110.7 kg)  06/20/22 240 lb 6.4 oz (109 kg)      Studies/Labs Reviewed:   EKG:    EKG Interpretation Date/Time:  Wednesday March 05 2023 13:10:42 EST Ventricular Rate:  66 PR Interval:  168 QRS Duration:  98 QT Interval:  396 QTC Calculation: 415 R Axis:   -18  Text Interpretation: Normal sinus rhythm Normal ECG When compared with ECG of 02-Sep-2021 17:12, PREVIOUS ECG IS PRESENT Confirmed by Tushar Enns ([id number]) on 03/05/2023 1:23:03 PM         Recent Labs: 12/12/2022: ALT 14; Hemoglobin 11.8; Platelets 213 01/09/2023: BUN 20; Creat 1.73;  Potassium 4.4; Sodium 142   Lipid Panel    Component Value Date/Time   CHOL 115 03/21/2022 0751   TRIG 144 03/21/2022 0751   HDL 45 03/21/2022 0751   CHOLHDL 2.6 03/21/2022 0751   CHOLHDL 4.8 01/20/2019 1128   VLDL 36 01/20/2019 1128   LDLCALC 45 03/21/2022 0751   01/12/2022 Cholesterol 109, triglycerides 94, HDL 51, LDL 40 01/25/2023 cholesterol 138, HDL 45, LDL 65, triglycerides 168    ASSESSMENT:    1. PVCs (premature ventricular contractions)      PLAN:  In order of problems listed above:  PVCs: Current asymptomatic.  On previous event monitor monitor, the PVCs were fairly frequent, representing up to 6% of all recorded beats and occasionally in the form of couplets or triplets.  He did not have any VT on the recent monitor.  Have offered beta-blockers but he prefers not to take these.  Currently not a problem. CAD: Physically active without angina.  History of false positive nuclear stress test in the past.  Coronary CTA shows high plaque burden and a proximal LAD moderate lesion, but no flow-limiting stenoses angiographically or by CT FFR.  The focus remains on risk factor  modification. HLP: Statin intolerant , but excellent lipid profile on current Repatha therapy. PreDM: Avoid weight gain, continue exercise and avoiding high glycemic index carbs. OSA: recommend 100% compliance with CPAP. Hyperuricemia/history of gout: no recent acute gout.   Medication Adjustments/Labs and Tests Ordered: Current medicines are reviewed at length with the patient today.  Concerns regarding medicines are outlined above.  Medication changes, Labs and Tests ordered today are listed in the Patient Instructions below. Patient Instructions  Medication Instructions:  Your physician recommends that you continue on your current medications as directed. Please refer to the Current Medication list given to you today.    *If you need a refill on your cardiac medications before your next appointment, please call your pharmacy*   Lab Work: None    If you have labs (blood work) drawn today and your tests are completely normal, you will receive your results only by: MyChart Message (if you have MyChart) OR A paper copy in the mail If you have any lab test that is abnormal or we need to change your treatment, we will call you to review the results.   Testing/Procedures:    Follow-Up: At [facility], you and your health needs are our priority.  As part of our continuing mission to provide you with exceptional heart care, we have created designated Provider Care Teams.  These Care Teams include your primary Cardiologist (physician) and Advanced Practice Providers (APPs -  Physician Assistants and Nurse Practitioners) who all work together to provide you with the care you need, when you need it.  We recommend signing up for the patient portal called "MyChart".  Sign up information is provided on this After Visit Summary.  MyChart is used to connect with patients for Virtual Visits (Telemedicine).  Patients are able to view lab/test results, encounter notes, upcoming appointments,  etc.  Non-urgent messages can be sent to your provider as well.   To learn more about what you can do with MyChart, go to [email or web address].    Your next appointment:   1 year(s)  The format for your next appointment:   In Person  Provider:   Thurmon Fair, MD    Other Instructions     Signed, Thurmon Fair, MD  03/12/2023 10:25 PM    [facility] Burch  [facility] [address] [place], [place], [place]  [address] Phone: [phone]; Fax: [phone]

## 2023-03-26 DIAGNOSIS — G4733 Obstructive sleep apnea (adult) (pediatric): Secondary | ICD-10-CM | POA: Diagnosis not present

## 2023-03-31 ENCOUNTER — Other Ambulatory Visit: Payer: Self-pay | Admitting: Cardiovascular Disease

## 2023-03-31 DIAGNOSIS — E782 Mixed hyperlipidemia: Secondary | ICD-10-CM

## 2023-05-13 ENCOUNTER — Other Ambulatory Visit: Payer: Self-pay | Admitting: Physician Assistant

## 2023-05-14 NOTE — Telephone Encounter (Signed)
 Last Fill: 02/12/2023  Labs: 02/06/2023 MCH 25.6, RDW 16.2, Creatinine is higher than before at 1.73 GFR is low. Uric Acid 8.4  Next Visit: 06/10/2023  Last Visit: 12/12/2023  DX:  Idiopathic chronic gout of multiple sites without tophus   Current Dose per office note 12/12/2023: allopurinol  300 mg 1 tablet by mouth daily   Okay to refill Allopurinol ?

## 2023-05-27 NOTE — Progress Notes (Signed)
 Office Visit Note  Patient: Peter Burch             Date of Birth: June 21, 1955           MRN: [id number]             PCP: Minus Amel, MD Referring: Minus Amel, MD Visit Date: 06/10/2023 Occupation: @GUAROCC @  Subjective:  Medication monitoring   History of Present Illness: Peter Burch is a 68 y.o. male with history of gout and osteoarthritis.  Patient remains on allopurinol  300 mg daily and colchicine  0.6 mg 1 tablet by mouth daily as needed.  He has been tolerating allopurinol  without any side effects and has not had any gaps in therapy since prior to his last office visit.  He denies any signs or symptoms of a gout flare.  Patient has had intermittent discomfort in the right knee and has had to use Voltaren  gel about 5-6 times within the last 6 months.  He requested a refill of Voltaren  gel to be sent to the pharmacy today.  He denies any joint swelling.  Patient has been trying to go to the gym 4 to 5 days a week for exercise.    Activities of Daily Living:  Patient reports morning stiffness for 1-2 minutes.   Patient Denies nocturnal pain.  Difficulty dressing/grooming: Denies Difficulty climbing stairs: Denies Difficulty getting out of chair: Denies Difficulty using hands for taps, buttons, cutlery, and/or writing: Denies  Review of Systems  Constitutional:  Negative for fatigue.  HENT:  Negative for mouth sores and mouth dryness.   Eyes:  Negative for dryness.  Respiratory:  Negative for shortness of breath.   Cardiovascular:  Negative for chest pain and palpitations.  Gastrointestinal:  Positive for constipation. Negative for blood in stool and diarrhea.  Endocrine: Negative for increased urination.  Genitourinary:  Negative for involuntary urination.  Musculoskeletal:  Positive for joint pain and joint pain. Negative for gait problem, joint swelling, myalgias, muscle weakness, morning stiffness, muscle tenderness and myalgias.  Skin:  Negative for color  change, rash, hair loss and sensitivity to sunlight.  Allergic/Immunologic: Negative for susceptible to infections.  Neurological:  Negative for dizziness and headaches.  Hematological:  Negative for swollen glands.  Psychiatric/Behavioral:  Negative for depressed mood and sleep disturbance. The patient is not nervous/anxious.     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   Idiopathic chronic gout of multiple sites without tophus 01/15/2016   Mixed hyperlipidemia 09/01/2012   Atherosclerosis of coronary artery of native heart without angina pectoris 09/01/2012    Past Medical History:  Diagnosis Date   CAD (coronary artery disease)    Constipation    Gout    Hypercholesteremia    Sinus bradycardia     Family History  Problem Relation Age of Onset   Heart attack Mother    Cancer Father        lung   Past Surgical History:  Procedure Laterality Date   CARDIAC CATHETERIZATION  05/16/2006   40% LAD lesion   COLONOSCOPY N/A 01/19/2015   Procedure: COLONOSCOPY;  Surgeon: Ruby Corporal, MD;  Location: [facility];  Service: Endoscopy;  Laterality: N/A;  1030   NM MYOCAR  PERF WALL MOTION  07/31/2009   mod to severe defect due to infarct/scar w/mild perinfarct ischemia   US  ECHOCARDIOGRAPHY  07/31/2009   impaired LV relaxation   Social History   Social History Narrative   Not on file   Immunization History  Administered Date(s) Administered   Moderna Sars-Covid-2 Vaccination 01/12/2019, 02/09/2019, 11/11/2019     Objective: Vital Signs: BP 135/77 (BP Location: Left Arm, Patient Position: Sitting, Cuff Size: Normal)   Pulse 85   Resp 16   Ht 6\' 3"  (1.905 m)   Wt 247 lb 6.4 oz (112.2 kg)   BMI  30.92 kg/m    Physical Exam Vitals and nursing note reviewed.  Constitutional:      Appearance: He is well-developed.  HENT:     Head: Normocephalic and atraumatic.  Eyes:     Conjunctiva/sclera: Conjunctivae normal.     Pupils: Pupils are equal, round, and reactive to light.  Cardiovascular:     Rate and Rhythm: Normal rate and regular rhythm.     Heart sounds: Normal heart sounds.  Pulmonary:     Effort: Pulmonary effort is normal.     Breath sounds: Normal breath sounds.  Abdominal:     General: Bowel sounds are normal.     Palpations: Abdomen is soft.  Musculoskeletal:     Cervical back: Normal range of motion and neck supple.  Skin:    General: Skin is warm and dry.     Capillary Refill: Capillary refill takes less than 2 seconds.  Neurological:     Mental Status: He is alert and oriented to person, place, and time.  Psychiatric:        Behavior: Behavior normal.      Musculoskeletal Exam: C-spine, thoracic spine, lumbar spine have good range of motion.  Shoulder joints, elbow joints, wrist joints, MCPs, PIPs, DIPs have good range of motion with no synovitis.  PIP and DIP thickening consistent with osteoarthritis of both hands.  Limited extension of bilateral middle fingers due to Dupuytren's contractures.  Hip joints have good range of motion with no groin pain.  Knee joints have good range of motion with no warmth or effusion.  Ankle joints have good range of motion with no tenderness or joint swelling.  CDAI Exam: CDAI Score: -- Patient Global: --; Provider Global: -- Swollen: --; Tender: -- Joint Exam 06/10/2023   No joint exam has been documented for this visit   There is currently no information documented on the homunculus. Go to the Rheumatology activity and complete the homunculus joint exam.  Investigation: No additional findings.  Imaging: No results found.  Recent Labs: Lab Results  Component Value Date   WBC 10.0 12/12/2022   HGB 11.8 (L)  12/12/2022   PLT 213 12/12/2022   NA 142 01/09/2023   K 4.4 01/09/2023   CL 104 01/09/2023   CO2 29 01/09/2023   GLUCOSE 92 01/09/2023   BUN 20 01/09/2023   CREATININE 1.73 (H) 01/09/2023   BILITOT 0.4 12/12/2022   ALKPHOS 81 09/02/2021   AST 14 12/12/2022   ALT 14 12/12/2022   PROT 6.1 12/12/2022   ALBUMIN 4.2 09/02/2021   CALCIUM 9.5 01/09/2023   GFRAA 69 01/20/2020    Speciality Comments: No specialty comments available.  Procedures:  No procedures performed Allergies: Statins   Assessment / Plan:     Visit Diagnoses: Idiopathic chronic gout of multiple sites without tophus -He is tolerating allopurinol  300 mg 1 tablet by mouth daily and Colchicine   0.6 mg 1 capsule by mouth daily as needed.  He has not had any recent gaps in therapy.  His uric acid level was 8.4 on 12/12/2022 at which time he was not consistently taking allopurinol .  Since his last office visit he has not had any interruptions in therapy.  He has not had any signs or symptoms of a gout flare.  Plan to update uric acid level today along with CBC and CMP.  No medication changes will be made at this time.  He was advised to notify us  if he develops signs or symptoms of a flare.  He will follow-up in the office in 6 months or sooner if needed. - Plan: Uric acid  Medication monitoring encounter - Uric acid 8.4 on 12/12/22. Order for uric acid released today.  Patient has been taking allopurinol  300 mg 1 tablet by mouth daily consistently without interruption since his last office visit.  And update uric acid level today.  Plan: CBC with Differential/Platelet, Comprehensive metabolic panel with GFR, Uric acid  Primary osteoarthritis of both hands: He has PIP and DIP thickening consistent with osteoarthritis of both hands.  No synovitis noted on examination.  Complete fist formation bilaterally.  Dupuytren's contracture of both hands: Incomplete extension of bilateral third digits.  Unchanged.  Elevated serum creatinine  -Creatinine was 1.34 and GFR was 57 on 02/06/2023.  Evaluated by nephrology.  Renal ultrasound performed on 02/04/2023: Right renal 1.7 cm simple cyst noted.   CMP with GFR up to today. plan: Comprehensive metabolic panel with GFR  Decreased GFR -CMP with GFR updated today.  Plan: Comprehensive metabolic panel with GFR  Primary osteoarthritis of both knees: He experiences intermittent discomfort in the right knee.  His right knee joint pain is typically exacerbated by walking on uneven terrain or cold weather temperatures.  He has been using Voltaren  gel sparingly for symptomatic relief.  On examination today he has good range of motion of both knee joints.  No warmth or effusion was noted.  He requested a refill of Voltaren  gel to be sent to the pharmacy today.  Discussed the importance of lower extremity muscle strengthening.  He was given a handout of exercises to perform.  He has been going to the gym 4 to 5 days a week for exercise.  He has recently transition from using the elliptical to the recumbent bike.  Other medical conditions are listed as follows:  History of renal stone  Dyslipidemia  History of coronary artery disease  History of hyperlipidemia  Orders: Orders Placed This Encounter  Procedures   CBC with Differential/Platelet   Comprehensive metabolic panel with GFR   Uric acid   No orders of the defined types were placed in this encounter.    Follow-Up Instructions: Return in about 6 months (around 12/10/2023) for Gout, Osteoarthritis.   Romayne Clubs, PA-C  Note - This record has been created using Dragon software.  Chart creation errors have been sought, but may not always  have been located. Such creation errors do not reflect on  the standard of medical care.

## 2023-06-10 ENCOUNTER — Other Ambulatory Visit: Payer: Self-pay

## 2023-06-10 ENCOUNTER — Ambulatory Visit: Payer: PPO | Attending: Physician Assistant | Admitting: Physician Assistant

## 2023-06-10 ENCOUNTER — Encounter: Payer: Self-pay | Admitting: Physician Assistant

## 2023-06-10 VITALS — BP 135/77 | HR 85 | Resp 16 | Ht 75.0 in | Wt 247.4 lb

## 2023-06-10 DIAGNOSIS — E785 Hyperlipidemia, unspecified: Secondary | ICD-10-CM | POA: Diagnosis not present

## 2023-06-10 DIAGNOSIS — M1A09X0 Idiopathic chronic gout, multiple sites, without tophus (tophi): Secondary | ICD-10-CM | POA: Diagnosis not present

## 2023-06-10 DIAGNOSIS — M19041 Primary osteoarthritis, right hand: Secondary | ICD-10-CM | POA: Diagnosis not present

## 2023-06-10 DIAGNOSIS — R944 Abnormal results of kidney function studies: Secondary | ICD-10-CM | POA: Diagnosis not present

## 2023-06-10 DIAGNOSIS — M19042 Primary osteoarthritis, left hand: Secondary | ICD-10-CM | POA: Diagnosis not present

## 2023-06-10 DIAGNOSIS — M170 Bilateral primary osteoarthritis of knee: Secondary | ICD-10-CM

## 2023-06-10 DIAGNOSIS — Z8679 Personal history of other diseases of the circulatory system: Secondary | ICD-10-CM

## 2023-06-10 DIAGNOSIS — Z5181 Encounter for therapeutic drug level monitoring: Secondary | ICD-10-CM | POA: Diagnosis not present

## 2023-06-10 DIAGNOSIS — M720 Palmar fascial fibromatosis [Dupuytren]: Secondary | ICD-10-CM | POA: Diagnosis not present

## 2023-06-10 DIAGNOSIS — R7989 Other specified abnormal findings of blood chemistry: Secondary | ICD-10-CM | POA: Diagnosis not present

## 2023-06-10 DIAGNOSIS — Z87442 Personal history of urinary calculi: Secondary | ICD-10-CM | POA: Diagnosis not present

## 2023-06-10 DIAGNOSIS — Z8639 Personal history of other endocrine, nutritional and metabolic disease: Secondary | ICD-10-CM

## 2023-06-10 LAB — CBC WITH DIFFERENTIAL/PLATELET
Absolute Lymphocytes: 1568 {cells}/uL (ref 850–3900)
Absolute Monocytes: 808 {cells}/uL (ref 200–950)
Basophils Absolute: 57 {cells}/uL (ref 0–200)
Basophils Relative: 0.6 %
Eosinophils Absolute: 713 {cells}/uL — ABNORMAL HIGH (ref 15–500)
Eosinophils Relative: 7.5 %
HCT: 48 % (ref 38.5–50.0)
Hemoglobin: 15.2 g/dL (ref 13.2–17.1)
MCH: 28.5 pg (ref 27.0–33.0)
MCHC: 31.7 g/dL — ABNORMAL LOW (ref 32.0–36.0)
MCV: 90.1 fL (ref 80.0–100.0)
MPV: 11.3 fL (ref 7.5–12.5)
Monocytes Relative: 8.5 %
Neutro Abs: 6356 {cells}/uL (ref 1500–7800)
Neutrophils Relative %: 66.9 %
Platelets: 213 10*3/uL (ref 140–400)
RBC: 5.33 10*6/uL (ref 4.20–5.80)
RDW: 14.5 % (ref 11.0–15.0)
Total Lymphocyte: 16.5 %
WBC: 9.5 10*3/uL (ref 3.8–10.8)

## 2023-06-10 LAB — COMPREHENSIVE METABOLIC PANEL WITH GFR
AG Ratio: 2.2 (calc) (ref 1.0–2.5)
ALT: 18 U/L (ref 9–46)
AST: 16 U/L (ref 10–35)
Albumin: 4.6 g/dL (ref 3.6–5.1)
Alkaline phosphatase (APISO): 76 U/L (ref 35–144)
BUN: 12 mg/dL (ref 7–25)
CO2: 29 mmol/L (ref 20–32)
Calcium: 9.9 mg/dL (ref 8.6–10.3)
Chloride: 104 mmol/L (ref 98–110)
Creat: 1.23 mg/dL (ref 0.70–1.35)
Globulin: 2.1 g/dL (ref 1.9–3.7)
Glucose, Bld: 119 mg/dL — ABNORMAL HIGH (ref 65–99)
Potassium: 4.4 mmol/L (ref 3.5–5.3)
Sodium: 143 mmol/L (ref 135–146)
Total Bilirubin: 0.5 mg/dL (ref 0.2–1.2)
Total Protein: 6.7 g/dL (ref 6.1–8.1)
eGFR: 64 mL/min/{1.73_m2} (ref >=60)

## 2023-06-10 LAB — URIC ACID: Uric Acid, Serum: 4.6 mg/dL (ref 4.0–8.0)

## 2023-06-10 MED ORDER — DICLOFENAC SODIUM 1 % EX GEL: CUTANEOUS | 2 refills | Status: AC

## 2023-06-10 NOTE — Telephone Encounter (Signed)
 Please review and send to pharmacy as discussed at appointment.

## 2023-06-10 NOTE — Patient Instructions (Signed)
 Knee Exercises Ask your health care provider which exercises are safe for you. Do exercises exactly as told by your health care provider and adjust them as directed. It is normal to feel mild stretching, pulling, tightness, or discomfort as you do these exercises. Stop right away if you feel sudden pain or your pain gets worse. Do not begin these exercises until told by your health care provider. Stretching and range-of-motion exercises These exercises warm up your muscles and joints and improve the movement and flexibility of your knee. These exercises also help to relieve pain and swelling. Knee extension, prone  Lie on your abdomen (prone position) on a bed. Place your left / right knee just beyond the edge of the surface so your knee is not on the bed. You can put a towel under your left / right thigh just above your kneecap for comfort. Relax your leg muscles and allow gravity to straighten your knee (extension). You should feel a stretch behind your left / right knee. Hold this position for __________ seconds. Scoot up so your knee is supported between repetitions. Repeat __________ times. Complete this exercise __________ times a day. Knee flexion, active  Lie on your back with both legs straight. If this causes back discomfort, bend your left / right knee so your foot is flat on the floor. Slowly slide your left / right heel back toward your buttocks. Stop when you feel a gentle stretch in the front of your knee or thigh (flexion). Hold this position for __________ seconds. Slowly slide your left / right heel back to the starting position. Repeat __________ times. Complete this exercise __________ times a day. Quadriceps stretch, prone  Lie on your abdomen on a firm surface, such as a bed or padded floor. Bend your left / right knee and hold your ankle. If you cannot reach your ankle or pant leg, loop a belt around your foot and grab the belt instead. Gently pull your heel toward your  buttocks. Your knee should not slide out to the side. You should feel a stretch in the front of your thigh and knee (quadriceps). Hold this position for __________ seconds. Repeat __________ times. Complete this exercise __________ times a day. Hamstring, supine  Lie on your back (supine position). Loop a belt or towel over the ball of your left / right foot. The ball of your foot is on the walking surface, right under your toes. Straighten your left / right knee and slowly pull on the belt to raise your leg until you feel a gentle stretch behind your knee (hamstring). Do not let your knee bend while you do this. Keep your other leg flat on the floor. Hold this position for __________ seconds. Repeat __________ times. Complete this exercise __________ times a day. Strengthening exercises These exercises build strength and endurance in your knee. Endurance is the ability to use your muscles for a long time, even after they get tired. Quadriceps, isometric This exercise strengthens the muscles in front of your thigh (quadriceps) without moving your knee joint (isometric). Lie on your back with your left / right leg extended and your other knee bent. Put a rolled towel or small pillow under your knee if told by your health care provider. Slowly tense the muscles in the front of your left / right thigh. You should see your kneecap slide up toward your hip or see increased dimpling just above the knee. This motion will push the back of the knee toward the floor.  For __________ seconds, hold the muscle as tight as you can without increasing your pain. Relax the muscles slowly and completely. Repeat __________ times. Complete this exercise __________ times a day. Straight leg raises This exercise strengthens the muscles in front of your thigh (quadriceps) and the muscles that move your hips (hip flexors). Lie on your back with your left / right leg extended and your other knee bent. Tense the  muscles in the front of your left / right thigh. You should see your kneecap slide up or see increased dimpling just above the knee. Your thigh may even shake a bit. Keep these muscles tight as you raise your leg 4-6 inches (10-15 cm) off the floor. Do not let your knee bend. Hold this position for __________ seconds. Keep these muscles tense as you lower your leg. Relax your muscles slowly and completely after each repetition. Repeat __________ times. Complete this exercise __________ times a day. Hamstring, isometric  Lie on your back on a firm surface. Bend your left / right knee about __________ degrees. Dig your left / right heel into the surface as if you are trying to pull it toward your buttocks. Tighten the muscles in the back of your thighs (hamstring) to "dig" as hard as you can without increasing any pain. Hold this position for __________ seconds. Release the tension gradually and allow your muscles to relax completely for __________ seconds after each repetition. Repeat __________ times. Complete this exercise __________ times a day. Hamstring curls If told by your health care provider, do this exercise while wearing ankle weights. Begin with __________lb / kg weights. Then increase the weight by 1 lb (0.5 kg) increments. Do not wear ankle weights that are more than __________lb / kg. Lie on your abdomen with your legs straight. Bend your left / right knee as far as you can without feeling pain. Keep your hips flat against the floor. Hold this position for __________ seconds. Slowly lower your leg to the starting position. Repeat __________ times. Complete this exercise __________ times a day. Squats This exercise strengthens the muscles in front of your thigh and knee (quadriceps). Stand in front of a table, with your feet and knees pointing straight ahead. You may rest your hands on the table for balance but not for support. Slowly bend your knees and lower your hips like you  are going to sit in a chair. Keep your weight over your heels, not over your toes. Keep your lower legs upright so they are parallel with the table legs. Do not let your hips go lower than your knees. Do not bend lower than told by your health care provider. If your knee pain increases, do not bend as low. Hold the squat position for __________ seconds. Slowly push with your legs to return to standing. Do not use your hands to pull yourself to standing. Repeat __________ times. Complete this exercise __________ times a day. Wall slides This exercise strengthens the muscles in front of your thigh and knee (quadriceps). Lean your back against a smooth wall or door, and walk your feet out 18-24 inches (46-61 cm) from it. Place your feet hip-width apart. Slowly slide down the wall or door until your knees bend __________ degrees. Keep your knees over your heels, not over your toes. Keep your knees in line with your hips. Hold this position for __________ seconds. Repeat __________ times. Complete this exercise __________ times a day. Straight leg raises, side-lying This exercise strengthens the muscles that rotate  the leg at the hip and move it away from your body (hip abductors). Lie on your side with your left / right leg in the top position. Lie so your head, shoulder, knee, and hip line up. You may bend your bottom knee to help you keep your balance. Roll your hips slightly forward so your hips are stacked directly over each other and your left / right knee is facing forward. Leading with your heel, lift your top leg 4-6 inches (10-15 cm). You should feel the muscles in your outer hip lifting. Do not let your foot drift forward. Do not let your knee roll toward the ceiling. Hold this position for __________ seconds. Slowly return your leg to the starting position. Let your muscles relax completely after each repetition. Repeat __________ times. Complete this exercise __________ times a  day. Straight leg raises, prone This exercise stretches the muscles that move your hips away from the front of the pelvis (hip extensors). Lie on your abdomen on a firm surface. You can put a pillow under your hips if that is more comfortable. Tense the muscles in your buttocks and lift your left / right leg about 4-6 inches (10-15 cm). Keep your knee straight as you lift your leg. Hold this position for __________ seconds. Slowly lower your leg to the starting position. Let your leg relax completely after each repetition. Repeat __________ times. Complete this exercise __________ times a day. This information is not intended to replace advice given to you by your health care provider. Make sure you discuss any questions you have with your health care provider. Document Revised: 09/05/2020 Document Reviewed: 09/05/2020 Elsevier Patient Education  2024 [employer].

## 2023-06-11 ENCOUNTER — Ambulatory Visit: Payer: Self-pay | Admitting: Physician Assistant

## 2023-06-11 NOTE — Progress Notes (Signed)
 Uric acid is within the desirable range--4.6.  Glucose is 119. Rest of CMP WNL. Cbc stable.

## 2023-06-19 DIAGNOSIS — X32XXXA Exposure to sunlight, initial encounter: Secondary | ICD-10-CM | POA: Diagnosis not present

## 2023-06-19 DIAGNOSIS — L570 Actinic keratosis: Secondary | ICD-10-CM | POA: Diagnosis not present

## 2023-06-19 DIAGNOSIS — L820 Inflamed seborrheic keratosis: Secondary | ICD-10-CM | POA: Diagnosis not present

## 2023-06-19 DIAGNOSIS — B078 Other viral warts: Secondary | ICD-10-CM | POA: Diagnosis not present

## 2023-06-19 DIAGNOSIS — D225 Melanocytic nevi of trunk: Secondary | ICD-10-CM | POA: Diagnosis not present

## 2023-06-19 DIAGNOSIS — Z1283 Encounter for screening for malignant neoplasm of skin: Secondary | ICD-10-CM | POA: Diagnosis not present

## 2023-07-29 DIAGNOSIS — G4733 Obstructive sleep apnea (adult) (pediatric): Secondary | ICD-10-CM | POA: Diagnosis not present

## 2023-08-06 DIAGNOSIS — N1831 Chronic kidney disease, stage 3a: Secondary | ICD-10-CM | POA: Diagnosis not present

## 2023-08-12 ENCOUNTER — Ambulatory Visit (HOSPITAL_COMMUNITY)
Admission: RE | Admit: 2023-08-12 | Discharge: 2023-08-12 | Disposition: A | Discharge status: Home / Self Care | Source: Ambulatory Visit | Attending: Internal Medicine | Admitting: Internal Medicine

## 2023-08-12 ENCOUNTER — Other Ambulatory Visit (HOSPITAL_COMMUNITY): Payer: Self-pay | Admitting: Internal Medicine

## 2023-08-12 DIAGNOSIS — M7661 Achilles tendinitis, right leg: Secondary | ICD-10-CM | POA: Diagnosis not present

## 2023-08-12 DIAGNOSIS — M79671 Pain in right foot: Secondary | ICD-10-CM

## 2023-08-12 DIAGNOSIS — N1831 Chronic kidney disease, stage 3a: Secondary | ICD-10-CM | POA: Diagnosis not present

## 2023-08-12 DIAGNOSIS — M722 Plantar fascial fibromatosis: Secondary | ICD-10-CM | POA: Diagnosis not present

## 2023-08-12 DIAGNOSIS — M19071 Primary osteoarthritis, right ankle and foot: Secondary | ICD-10-CM | POA: Diagnosis not present

## 2023-08-12 DIAGNOSIS — Z6830 Body mass index (BMI) 30.0-30.9, adult: Secondary | ICD-10-CM | POA: Diagnosis not present

## 2023-08-12 DIAGNOSIS — E6609 Other obesity due to excess calories: Secondary | ICD-10-CM | POA: Diagnosis not present

## 2023-08-16 ENCOUNTER — Other Ambulatory Visit: Payer: Self-pay | Admitting: Rheumatology

## 2023-08-18 NOTE — Telephone Encounter (Signed)
 Last Fill: 05/14/2023  Labs: 08/06/2023 CBC: eosinophils absolute 0.9 Uric acid: 5 06/10/2023 Uric acid is within the desirable range--4.6.  Glucose is 119. Rest of CMP WNL.  Cbc stable.     Next Visit: 12/16/2023  Last Visit: 06/10/2023  DX: Idiopathic chronic gout of multiple sites without tophus   Current Dose per office note on 06/10/2023: allopurinol  300 mg 1 tablet by mouth daily   Okay to refill Allopurinol ?

## 2023-08-21 DIAGNOSIS — G4733 Obstructive sleep apnea (adult) (pediatric): Secondary | ICD-10-CM | POA: Diagnosis not present

## 2023-08-21 DIAGNOSIS — N1831 Chronic kidney disease, stage 3a: Secondary | ICD-10-CM | POA: Diagnosis not present

## 2023-08-21 DIAGNOSIS — D508 Other iron deficiency anemias: Secondary | ICD-10-CM | POA: Diagnosis not present

## 2023-08-22 ENCOUNTER — Encounter: Payer: Self-pay | Admitting: Cardiovascular Disease

## 2023-08-22 NOTE — Telephone Encounter (Signed)
 The heart rates spontaneously reported by the watch are based on the detection of the peripheral pulse (often undercounting the PVCs), not the electrical rhythm (which tells us  the true heart rate).   If the ECG reported rate is normal and Peter Burch is asymptomatic, there is no reason to worry and no reason to change treatment.

## 2023-10-07 ENCOUNTER — Encounter: Payer: Self-pay | Admitting: Cardiovascular Disease

## 2023-10-18 ENCOUNTER — Ambulatory Visit
Admission: RE | Admit: 2023-10-18 | Discharge: 2023-10-18 | Disposition: A | Discharge status: Home / Self Care | Payer: Self-pay | Source: Ambulatory Visit | Attending: Family Medicine | Admitting: Family Medicine

## 2023-10-18 VITALS — BP 123/70 | HR 81 | Temp 98.6°F | Resp 18

## 2023-10-18 DIAGNOSIS — M722 Plantar fascial fibromatosis: Secondary | ICD-10-CM | POA: Diagnosis not present

## 2023-10-18 MED ORDER — PREDNISONE 20 MG PO TABS: 40.0000 mg | ORAL_TABLET | Freq: Every day | ORAL | 0 refills | Status: AC

## 2023-10-18 NOTE — ED Triage Notes (Signed)
 Pt reports he has right foot pain x 2 weeks.

## 2023-10-18 NOTE — Discharge Instructions (Addendum)
 In addition to the prescribed medication and the exercises and stretches given, you may roll your foot on a frozen water  bottle off-and-on throughout the day or a tennis or lacrosse ball, place special inserts into your shoe for more support and they do offer plantar fasciitis braces that you can wear overnight to help additionally.  Follow-up with podiatry for worsening symptoms.

## 2023-10-18 NOTE — ED Provider Notes (Signed)
 [facility]    CSN: [id number] Arrival date & time: 10/18/23  1044      History   Chief Complaint Chief Complaint  Patient presents with   Foot Pain    Plantar fasciitis was treated 6 weeks ago. - Entered by patient    HPI Peter Burch is a 68 y.o. male.   Patient presenting today with 2-week history of acute flare of plantar fasciitis diagnosed about 6 weeks ago through PCP.  Was given a round of prednisone  and states this immediately resolved the issue but has since had some recurring pain.  States it starts at his heel and moves upward toward the sole of his foot on the plantar aspect worse with weightbearing and movement.  Denies redness, swelling, direct injury, numbness, tingling, decreased range of motion.  Not currently trying anything for symptoms over-the-counter.    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   Idiopathic chronic gout of multiple sites without tophus 01/15/2016   Mixed hyperlipidemia 09/01/2012   Atherosclerosis of coronary artery of native heart without angina pectoris 09/01/2012    Past Surgical History:  Procedure Laterality Date   CARDIAC CATHETERIZATION  05/16/2006   40% LAD lesion   COLONOSCOPY N/A 01/19/2015   Procedure: COLONOSCOPY;  Surgeon: Claudis RAYMOND Rivet, MD;  Location: [facility];  Service: Endoscopy;  Laterality: N/A;  1030   NM MYOCAR PERF WALL MOTION  07/31/2009   mod to severe defect due to infarct/scar w/mild perinfarct ischemia   US  ECHOCARDIOGRAPHY  07/31/2009   impaired LV relaxation       Home  Medications    Prior to Admission medications   Medication Sig Start Date End Date Taking? Authorizing Provider  predniSONE  (DELTASONE ) 20 MG tablet Take 2 tablets (40 mg total) by mouth daily with breakfast. 10/18/23  Yes Stuart Vernell Norris, PA-C  allopurinol  (ZYLOPRIM ) 300 MG tablet TAKE (1) TABLET BY MOUTH ONCE DAILY. 08/18/23   Cheryl Waddell HERO, PA-C  ALPRAZolam (XANAX) 0.5 MG tablet Take 0.25-0.5 tablets by mouth daily as needed for anxiety.  05/15/12   [provider]  clotrimazole-betamethasone (LOTRISONE) cream Apply topically as needed. 08/22/21   [provider]  Colchicine  0.6 MG CAPS Take 0.6 mg by mouth as needed. 07/01/17   [provider]  diclofenac  Sodium (VOLTAREN ) 1 % GEL Apply 2-4 grams to affected joint 4 times daily as needed. 06/10/23   Cheryl Waddell HERO, PA-C  esomeprazole (NEXIUM) 20 MG capsule Take 20 mg by mouth daily as needed (for GERD).    [provider]  fluticasone (FLONASE) 50 MCG/ACT nasal spray Place 1 spray into the nose as needed for allergies. 05/15/12   [provider]  REPATHA  SURECLICK 140 MG/ML SOAJ INJECT 140MG  INTO THE SKIN EVERY 14 DAYS. 03/31/23   Croitoru, Mihai, MD  TRULANCE 3 MG TABS Take 1 tablet by mouth daily. 12/14/21   [provider]    Family History Family History  Problem Relation Age of Onset   Heart attack Mother    Cancer  Father        lung    Social History Social History   Tobacco Use   Smoking status: Never    Passive exposure: Past   Smokeless tobacco: Never  Vaping Use   Vaping status: Never Used  Substance Use Topics   Alcohol use: Not Currently   Drug use: Not Currently    Comment: states he has never done any     Allergies   Statins   Review of Systems Review of Systems Per HPI  Physical Exam Triage Vital Signs ED Triage Vitals  Encounter Vitals Group     BP 10/18/23 1122 123/70     Girls Systolic BP Percentile --      Girls Diastolic BP Percentile --       Boys Systolic BP Percentile --      Boys Diastolic BP Percentile --      Pulse Rate 10/18/23 1122 81     Resp 10/18/23 1122 18     Temp 10/18/23 1122 98.6 F (37 C)     Temp Source 10/18/23 1122 Oral     SpO2 10/18/23 1122 97 %     Weight --      Height --      Head Circumference --      Peak Flow --      Pain Score 10/18/23 1123 2     Pain Loc --      Pain Education --      Exclude from Growth Chart --    No data found.  Updated Vital Signs BP 123/70 (BP Location: Right Arm)   Pulse 81   Temp 98.6 F (37 C) (Oral)   Resp 18   SpO2 97%   Visual Acuity Right Eye Distance:   Left Eye Distance:   Bilateral Distance:    Right Eye Near:   Left Eye Near:    Bilateral Near:     Physical Exam Vitals and nursing note reviewed.  Constitutional:      Appearance: Normal appearance.  HENT:     Head: Atraumatic.  Eyes:     Extraocular Movements: Extraocular movements intact.     Conjunctiva/sclera: Conjunctivae normal.  Cardiovascular:     Rate and Rhythm: Normal rate.  Pulmonary:     Effort: Pulmonary effort is normal.  Musculoskeletal:        General: Tenderness present. No swelling, deformity or signs of injury. Normal range of motion.     Cervical back: Normal range of motion and neck supple.     Comments: Mild tenderness to palpation along the plantar aspect of the right foot from the heel to the sole  Skin:    General: Skin is warm and dry.     Findings: No erythema.  Neurological:     Mental Status: He is oriented to person, place, and time.     Comments: Right lower extremity neurovascularly intact  Psychiatric:        Mood and Affect: Mood normal.        Thought Content: Thought content normal.        Judgment: Judgment normal.      UC Treatments / Results  Labs (all labs ordered are listed, but only abnormal results are displayed) Labs Reviewed - No data to display  EKG   Radiology No results found.  Procedures Procedures (including  critical care time)  Medications Ordered in UC Medications - No data to display  Initial Impression / Assessment and Plan /  UC Course  I have reviewed the triage vital signs and the nursing notes.  Pertinent labs & imaging results that were available during my care of the patient were reviewed by me and considered in my medical decision making (see chart for details).     Acute plantar fasciitis flare, treat with prednisone , stretches, massage, inserts and podiatry follow-up if not resolving.  Final Clinical Impressions(s) / UC Diagnoses   Final diagnoses:  Plantar fasciitis of right foot     Discharge Instructions      In addition to the prescribed medication and the exercises and stretches given, you may roll your foot on a frozen water  bottle off-and-on throughout the day or a tennis or lacrosse ball, place special inserts into your shoe for more support and they do offer plantar fasciitis braces that you can wear overnight to help additionally.  Follow-up with podiatry for worsening symptoms.    ED Prescriptions     Medication Sig Dispense Auth. Provider   predniSONE  (DELTASONE ) 20 MG tablet Take 2 tablets (40 mg total) by mouth daily with breakfast. 10 tablet Stuart Vernell Norris, [place]      PDMP not reviewed this encounter.   Stuart Vernell Norris, [place] 10/18/23 1146

## 2023-11-09 ENCOUNTER — Ambulatory Visit
Admission: EM | Admit: 2023-11-09 | Discharge: 2023-11-09 | Disposition: A | Discharge status: Home / Self Care | Attending: Family Medicine | Admitting: Family Medicine

## 2023-11-09 DIAGNOSIS — R591 Generalized enlarged lymph nodes: Secondary | ICD-10-CM | POA: Diagnosis not present

## 2023-11-09 DIAGNOSIS — R6884 Jaw pain: Secondary | ICD-10-CM | POA: Diagnosis not present

## 2023-11-09 MED ORDER — AMOXICILLIN-POT CLAVULANATE 875-125 MG PO TABS: 1.0000 | ORAL_TABLET | Freq: Two times a day (BID) | ORAL | 0 refills | Status: AC

## 2023-11-09 NOTE — ED Triage Notes (Addendum)
 Pt reports swelling on the left side of the face lower jaw, just finished antibiotics for a bone graft, states there is a knot under the lower jaw.

## 2023-11-09 NOTE — Discharge Instructions (Signed)
 You may take over-the-counter pain relievers, apply warm compresses and start the antibiotic.  Follow-up with dentist and/or primary care for recheck

## 2023-11-09 NOTE — ED Provider Notes (Signed)
 [facility]    CSN: [id number] Arrival date & time: 11/09/23  1423      History   Chief Complaint No chief complaint on file.   HPI Peter Burch is a 68 y.o. male.   Patient presenting today with pain, swelling to the left lower jaw region that he first noticed this morning.  He states he just had a bone graft to the right jaw and finished antibiotics a couple of days ago for this just before symptoms started on the left side.  Denies fever, chills, difficulty breathing or swallowing, injury to the area.    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   Idiopathic chronic gout of multiple sites without tophus 01/15/2016   Mixed hyperlipidemia 09/01/2012   Atherosclerosis of coronary artery of native heart without angina pectoris 09/01/2012    Past Surgical History:  Procedure Laterality Date   CARDIAC CATHETERIZATION  05/16/2006   40% LAD lesion   COLONOSCOPY N/A 01/19/2015   Procedure: COLONOSCOPY;  Surgeon: Claudis RAYMOND Rivet, MD;  Location: [facility];  Service: Endoscopy;  Laterality: N/A;  1030   NM MYOCAR PERF WALL MOTION  07/31/2009   mod to severe defect due to infarct/scar w/mild perinfarct ischemia   US  ECHOCARDIOGRAPHY  07/31/2009   impaired LV relaxation       Home Medications    Prior to Admission medications   Medication Sig Start Date End Date Taking? Authorizing Provider  amoxicillin-clavulanate (AUGMENTIN) 875-125 MG tablet Take 1 tablet by mouth every 12 (twelve) hours. 11/09/23  Yes Stuart Vernell Norris, PA-C  allopurinol  (ZYLOPRIM )  300 MG tablet TAKE (1) TABLET BY MOUTH ONCE DAILY. 08/18/23   Cheryl Waddell HERO, PA-C  ALPRAZolam (XANAX) 0.5 MG tablet Take 0.25-0.5 tablets by mouth daily as needed for anxiety.  05/15/12   [provider]  clotrimazole-betamethasone (LOTRISONE) cream Apply topically as needed. 08/22/21   [provider]  Colchicine  0.6 MG CAPS Take 0.6 mg by mouth as needed. 07/01/17   [provider]  diclofenac  Sodium (VOLTAREN ) 1 % GEL Apply 2-4 grams to affected joint 4 times daily as needed. 06/10/23   Cheryl Waddell HERO, PA-C  esomeprazole (NEXIUM) 20 MG capsule Take 20 mg by mouth daily as needed (for GERD).    [provider]  fluticasone (FLONASE) 50 MCG/ACT nasal spray Place 1 spray into the nose as needed for allergies. 05/15/12   [provider]  predniSONE  (DELTASONE ) 20 MG tablet Take 2 tablets (40 mg total) by mouth daily with breakfast. 10/18/23   Stuart Vernell Norris, PA-C  REPATHA  SURECLICK 140 MG/ML SOAJ INJECT 140MG  INTO THE SKIN EVERY 14 DAYS. 03/31/23   Croitoru, Mihai, MD  TRULANCE 3 MG TABS Take 1 tablet by mouth daily. 12/14/21   [provider]    Family History Family History  Problem Relation Age of Onset   Heart attack Mother    Cancer Father        lung    Social History Social History   Tobacco Use  Smoking status: Never    Passive exposure: Past   Smokeless tobacco: Never  Vaping Use   Vaping status: Never Used  Substance Use Topics   Alcohol use: Not Currently   Drug use: Not Currently    Comment: states he has never done any     Allergies   Statins   Review of Systems Review of Systems Per HPI  Physical Exam Triage Vital Signs ED Triage Vitals  Encounter Vitals Group     BP 11/09/23 1509 122/72     Girls Systolic BP Percentile --      Girls Diastolic BP Percentile --      Boys Systolic BP Percentile --      Boys Diastolic BP Percentile --      Pulse Rate 11/09/23 1509 76     Resp 11/09/23 1509 18      Temp 11/09/23 1509 98.7 F (37.1 C)     Temp Source 11/09/23 1509 Oral     SpO2 11/09/23 1509 96 %     Weight --      Height --      Head Circumference --      Peak Flow --      Pain Score 11/09/23 1512 3     Pain Loc --      Pain Education --      Exclude from Growth Chart --    No data found.  Updated Vital Signs BP 122/72 (BP Location: Right Arm)   Pulse 76   Temp 98.7 F (37.1 C) (Oral)   Resp 18   SpO2 96%   Visual Acuity Right Eye Distance:   Left Eye Distance:   Bilateral Distance:    Right Eye Near:   Left Eye Near:    Bilateral Near:     Physical Exam Vitals and nursing note reviewed.  Constitutional:      Appearance: Normal appearance.  HENT:     Head: Atraumatic.     Nose: Nose normal.     Mouth/Throat:     Mouth: Mucous membranes are moist.     Pharynx: Oropharynx is clear.     Comments: Mobile firm mass to the left lower jaw region, possibly submandibular lymph node Eyes:     Extraocular Movements: Extraocular movements intact.     Conjunctiva/sclera: Conjunctivae normal.  Cardiovascular:     Rate and Rhythm: Normal rate.  Pulmonary:     Effort: Pulmonary effort is normal.  Musculoskeletal:        General: Normal range of motion.     Cervical back: Normal range of motion and neck supple.  Skin:    General: Skin is warm and dry.  Neurological:     General: No focal deficit present.     Mental Status: He is oriented to person, place, and time.  Psychiatric:        Mood and Affect: Mood normal.        Thought Content: Thought content normal.        Judgment: Judgment normal.      UC Treatments / Results  Labs (all labs ordered are listed, but only abnormal results are displayed) Labs Reviewed - No data to display  EKG   Radiology No results found.  Procedures Procedures (including critical care time)  Medications Ordered in UC Medications - No data to display  Initial Impression / Assessment and Plan / UC Course  I have  reviewed the triage vital signs and the nursing notes.  Pertinent labs & imaging results that were available during my care of the patient were reviewed by me and considered in my medical decision making (see chart for details).     Will cover with Augmentin given his recent oral surgery now with what may be a swollen submandibular lymph node associated with jaw pain.  Discussed warm compresses, over-the-counter pain relievers and close dental follow-up.  Return for worsening or unresolving symptoms.  Final Clinical Impressions(s) / UC Diagnoses   Final diagnoses:  Jaw pain  Lymphadenopathy     Discharge Instructions      You may take over-the-counter pain relievers, apply warm compresses and start the antibiotic.  Follow-up with dentist and/or primary care for recheck    ED Prescriptions     Medication Sig Dispense Auth. Provider   amoxicillin-clavulanate (AUGMENTIN) 875-125 MG tablet Take 1 tablet by mouth every 12 (twelve) hours. 14 tablet Stuart Vernell Norris, [place]      PDMP not reviewed this encounter.   Stuart Vernell Norris, [place] 11/09/23 1551

## 2023-11-11 DIAGNOSIS — R7309 Other abnormal glucose: Secondary | ICD-10-CM | POA: Diagnosis not present

## 2023-11-11 DIAGNOSIS — R590 Localized enlarged lymph nodes: Secondary | ICD-10-CM | POA: Diagnosis not present

## 2023-11-13 ENCOUNTER — Other Ambulatory Visit: Payer: Self-pay | Admitting: Physician Assistant

## 2023-11-13 NOTE — Telephone Encounter (Signed)
 Last Fill: 08/18/2023  Labs: 08/06/2023 Eosinophils Absolute 0.9, Glucose 102, Creat. 1.37, GFR 56 Uric Acid 5   Next Visit: 12/16/2023  Last Visit: 06/10/2023  DX: Idiopathic chronic gout of multiple sites without tophus   Current Dose per office note 06/10/2023: allopurinol  300 mg 1 tablet by mouth daily   Okay to refill Allopurinol ?

## 2023-12-02 NOTE — Progress Notes (Signed)
 Office Visit Note  Patient: Peter Burch             Date of Birth: 05-Jun-1955           MRN: [id number]             PCP: Marvine Rush, MD Referring: Marvine Rush, MD Visit Date: 12/16/2023 Occupation: Data Unavailable  Subjective:  Left leg pain  History of Present Illness: Peter Burch is a 68 y.o. male with gout and osteoarthritis.  He returns today after his last visit on June 10, 2023.  He states on November 28 he tripped while walking in the woods.  He states he injured his left leg.  He has been having pain and discomfort over the mid shin region.  He states he has been having some nocturnal pain.  He does not have much discomfort walking.  He has not had any gout flare since the last visit.  He has been taking allopurinol  300 mg a day on a regular basis and colchicine  only on as needed basis.  He continues to have some stiffness in his right knee joint in his hands.  But he has not noticed any swelling.    Activities of Daily Living:  Patient reports morning stiffness for 5-15 minutes.   Patient Reports nocturnal pain.  Difficulty dressing/grooming: Denies Difficulty climbing stairs: Denies Difficulty getting out of chair: Denies Difficulty using hands for taps, buttons, cutlery, and/or writing: Denies  Review of Systems  Constitutional:  Negative for fatigue.  HENT:  Negative for mouth sores and mouth dryness.   Eyes:  Negative for dryness.  Respiratory:  Negative for shortness of breath.   Cardiovascular:  Negative for chest pain and palpitations.  Gastrointestinal:  Negative for blood in stool, constipation and diarrhea.  Endocrine: Negative for increased urination.  Genitourinary:  Negative for involuntary urination.  Musculoskeletal:  Positive for joint pain, joint pain, muscle weakness and muscle tenderness. Negative for gait problem, joint swelling, myalgias, morning stiffness and myalgias.  Skin:  Negative for color change, rash, hair loss and sensitivity  to sunlight.  Allergic/Immunologic: Negative for susceptible to infections.  Neurological:  Negative for dizziness and headaches.  Hematological:  Negative for swollen glands.  Psychiatric/Behavioral:  Negative for depressed mood and sleep disturbance. The patient is not nervous/anxious.     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   Idiopathic chronic gout of multiple sites without tophus 01/15/2016   Mixed hyperlipidemia 09/01/2012   Atherosclerosis of coronary artery of native heart without angina pectoris 09/01/2012    Past Medical History:  Diagnosis Date   CAD (coronary artery disease)    Constipation    Gout    Hypercholesteremia    Sinus bradycardia     Family History  Problem Relation Age of Onset   Heart attack Mother    Cancer Father        lung   Past Surgical History:  Procedure Laterality Date   CARDIAC CATHETERIZATION  05/16/2006   40% LAD lesion   COLONOSCOPY N/A 01/19/2015   Procedure: COLONOSCOPY;  Surgeon: Claudis RAYMOND Rivet, MD;  Location: [facility];  Service: Endoscopy;  Laterality: N/A;  1030   NM MYOCAR PERF WALL  MOTION  07/31/2009   mod to severe defect due to infarct/scar w/mild perinfarct ischemia   US  ECHOCARDIOGRAPHY  07/31/2009   impaired LV relaxation   Social History   Tobacco Use   Smoking status: Never    Passive exposure: Past   Smokeless tobacco: Never  Vaping Use   Vaping status: Never Used  Substance Use Topics   Alcohol use: Not Currently   Drug use: Not Currently    Comment: states he has never done any   Social History   Social History Narrative   Not on file     Immunization History  Administered Date(s) Administered   Moderna Sars-Covid-2  Vaccination 01/12/2019, 02/09/2019, 11/11/2019     Objective: Vital Signs: BP (!) 131/90   Pulse 71   Temp 98 F (36.7 C)   Resp 16   Ht 6' 3 (1.905 m)   Wt 247 lb 12.8 oz (112.4 kg)   BMI 30.97 kg/m    Physical Exam Vitals and nursing note reviewed.  Constitutional:      Appearance: He is well-developed.  HENT:     Head: Normocephalic and atraumatic.  Eyes:     Conjunctiva/sclera: Conjunctivae normal.     Pupils: Pupils are equal, round, and reactive to light.  Cardiovascular:     Rate and Rhythm: Normal rate and regular rhythm.     Heart sounds: Normal heart sounds.  Pulmonary:     Effort: Pulmonary effort is normal.     Breath sounds: Normal breath sounds.  Abdominal:     General: Bowel sounds are normal.     Palpations: Abdomen is soft.  Musculoskeletal:     Cervical back: Normal range of motion and neck supple.  Skin:    General: Skin is warm and dry.     Capillary Refill: Capillary refill takes less than 2 seconds.  Neurological:     Mental Status: He is alert and oriented to person, place, and time.  Psychiatric:        Behavior: Behavior normal.      Musculoskeletal Exam: Cervical, thoracic and lumbar spine were in good range of motion.  There was no SI joint tenderness.  Shoulder joints, elbow joints, wrist joints, MCPs, PIPs and DIPs were in good range of motion with no synovitis.  Bilateral PIP and DIP thickening was noted.  Hip joints and knee joints were in good range of motion without any warmth swelling or effusion.  He had tenderness over the left shin and the midshaft region.  He also had some soft tissue swelling in the surrounding area.  There was no tenderness over ankles or MTPs.   CDAI Exam: CDAI Score: -- Patient Global: --; Provider Global: -- Swollen: --; Tender: -- Joint Exam 12/16/2023   No joint exam has been documented for this visit   There is currently no information documented on the homunculus. Go to the Rheumatology activity  and complete the homunculus joint exam.  Investigation: No additional findings.  Imaging: No results found.  Recent Labs: Lab Results  Component Value Date   WBC 9.5 06/10/2023   HGB 15.2 06/10/2023   PLT 213 06/10/2023   NA 143 06/10/2023   K 4.4 06/10/2023   CL 104 06/10/2023   CO2 29 06/10/2023   GLUCOSE 119 (H) 06/10/2023   BUN 12 06/10/2023   CREATININE 1.23 06/10/2023   BILITOT 0.5 06/10/2023   ALKPHOS 81 09/02/2021   AST 16 06/10/2023   ALT 18 06/10/2023   PROT 6.7  06/10/2023   ALBUMIN 4.2 09/02/2021   CALCIUM 9.9 06/10/2023   GFRAA 69 01/20/2020    Speciality Comments: No specialty comments available.  Procedures:  No procedures performed Allergies: Statins   Assessment / Plan:     Visit Diagnoses: Idiopathic chronic gout of multiple sites without tophus -patient denies having a flare of gout.  He has been taking allopurinol  300 mg 1 tablet by mouth daily and Colchicine  0.6 mg 1 capsule by mouth daily as needed. -Uric acid 4.6 on June 10, 2023.  Plan: Uric acid  Medication monitoring encounter - uric acid: 4.6 on June 10, 2023.- Plan: CBC with Differential/Platelet, Comprehensive metabolic panel with GFR today.  CBC and CMP were normal on June 10, 2023.  Primary osteoarthritis of both hands-he had bilateral PIP and DIP thickening.  No synovitis was noted.  Elevated serum creatinine - Evaluated by nephrology.  Renal ultrasound performed on 02/04/2023: Right renal 1.7 cm simple cyst noted.  Recent creatinine was normal on June 10, 2023.  Pain in left leg -he he tripped and fell in the woods on December 05, 2023.  He has been having pain and swelling in the left leg.  He had tenderness over the left tibia, midshaft and also soft tissue swelling was noted.  I will schedule x-ray will be done at [facility]  Plan: DG Tibia/Fibula Left  History of recent fall - Plan: DG Tibia/Fibula Left  Primary osteoarthritis of both knees-he has intermittent discomfort in  his knee joints due to also osteoarthritis.  Other medical problems are listed as follows:  History of renal stone  Dyslipidemia  History of coronary artery disease  Orders: Orders Placed This Encounter  Procedures   DG Tibia/Fibula Left   CBC with Differential/Platelet   Comprehensive metabolic panel with GFR   Uric acid   No orders of the defined types were placed in this encounter.    Follow-Up Instructions: Return in about 6 months (around 06/15/2024) for Gout, Osteoarthritis.   Maya Nash, MD  Note - This record has been created using Animal nutritionist.  Chart creation errors have been sought, but may not always  have been located. Such creation errors do not reflect on  the standard of medical care.

## 2023-12-16 ENCOUNTER — Ambulatory Visit: Attending: Rheumatology | Admitting: Rheumatology

## 2023-12-16 ENCOUNTER — Encounter: Payer: Self-pay | Admitting: Rheumatology

## 2023-12-16 VITALS — BP 131/90 | HR 71 | Temp 98.0°F | Resp 16 | Ht 75.0 in | Wt 247.8 lb

## 2023-12-16 DIAGNOSIS — M720 Palmar fascial fibromatosis [Dupuytren]: Secondary | ICD-10-CM

## 2023-12-16 DIAGNOSIS — M170 Bilateral primary osteoarthritis of knee: Secondary | ICD-10-CM

## 2023-12-16 DIAGNOSIS — E785 Hyperlipidemia, unspecified: Secondary | ICD-10-CM | POA: Diagnosis not present

## 2023-12-16 DIAGNOSIS — Z8679 Personal history of other diseases of the circulatory system: Secondary | ICD-10-CM | POA: Diagnosis not present

## 2023-12-16 DIAGNOSIS — Z5181 Encounter for therapeutic drug level monitoring: Secondary | ICD-10-CM | POA: Diagnosis not present

## 2023-12-16 DIAGNOSIS — Z9181 History of falling: Secondary | ICD-10-CM | POA: Diagnosis not present

## 2023-12-16 DIAGNOSIS — Z8639 Personal history of other endocrine, nutritional and metabolic disease: Secondary | ICD-10-CM

## 2023-12-16 DIAGNOSIS — M19041 Primary osteoarthritis, right hand: Secondary | ICD-10-CM

## 2023-12-16 DIAGNOSIS — Z87442 Personal history of urinary calculi: Secondary | ICD-10-CM

## 2023-12-16 DIAGNOSIS — R944 Abnormal results of kidney function studies: Secondary | ICD-10-CM

## 2023-12-16 DIAGNOSIS — M19042 Primary osteoarthritis, left hand: Secondary | ICD-10-CM | POA: Diagnosis not present

## 2023-12-16 DIAGNOSIS — M79605 Pain in left leg: Secondary | ICD-10-CM

## 2023-12-16 DIAGNOSIS — M1A09X0 Idiopathic chronic gout, multiple sites, without tophus (tophi): Secondary | ICD-10-CM | POA: Diagnosis not present

## 2023-12-16 DIAGNOSIS — R7989 Other specified abnormal findings of blood chemistry: Secondary | ICD-10-CM

## 2023-12-17 ENCOUNTER — Ambulatory Visit (HOSPITAL_COMMUNITY)
Admission: RE | Admit: 2023-12-17 | Discharge: 2023-12-17 | Disposition: A | Discharge status: Home / Self Care | Source: Ambulatory Visit | Attending: Rheumatology | Admitting: Rheumatology

## 2023-12-17 ENCOUNTER — Ambulatory Visit: Payer: Self-pay | Admitting: Rheumatology

## 2023-12-17 DIAGNOSIS — Z9181 History of falling: Secondary | ICD-10-CM | POA: Diagnosis not present

## 2023-12-17 DIAGNOSIS — M79605 Pain in left leg: Secondary | ICD-10-CM | POA: Insufficient documentation

## 2023-12-17 LAB — CBC WITH DIFFERENTIAL/PLATELET
Absolute Lymphocytes: 1513 {cells}/uL (ref 850–3900)
Absolute Monocytes: 805 {cells}/uL (ref 200–950)
Basophils Absolute: 68 {cells}/uL (ref 0–200)
Basophils Relative: 0.7 %
Eosinophils Absolute: 1125 {cells}/uL — ABNORMAL HIGH (ref 15–500)
Eosinophils Relative: 11.6 %
HCT: 46.8 % (ref 39.4–51.1)
Hemoglobin: 14.8 g/dL (ref 13.2–17.1)
MCH: 27.9 pg (ref 27.0–33.0)
MCHC: 31.6 g/dL (ref 31.6–35.4)
MCV: 88.3 fL (ref 81.4–101.7)
MPV: 11.6 fL (ref 7.5–12.5)
Monocytes Relative: 8.3 %
Neutro Abs: 6189 {cells}/uL (ref 1500–7800)
Neutrophils Relative %: 63.8 %
Platelets: 209 Thousand/uL (ref 140–400)
RBC: 5.3 Million/uL (ref 4.20–5.80)
RDW: 14.1 % (ref 11.0–15.0)
Total Lymphocyte: 15.6 %
WBC: 9.7 Thousand/uL (ref 3.8–10.8)

## 2023-12-17 LAB — COMPREHENSIVE METABOLIC PANEL WITH GFR
AG Ratio: 2 (calc) (ref 1.0–2.5)
ALT: 18 U/L (ref 9–46)
AST: 17 U/L (ref 10–35)
Albumin: 4.5 g/dL (ref 3.6–5.1)
Alkaline phosphatase (APISO): 78 U/L (ref 35–144)
BUN/Creatinine Ratio: 9 (calc) (ref 6–22)
BUN: 12 mg/dL (ref 7–25)
CO2: 31 mmol/L (ref 20–32)
Calcium: 9.6 mg/dL (ref 8.6–10.3)
Chloride: 105 mmol/L (ref 98–110)
Creat: 1.31 mg/dL — ABNORMAL HIGH (ref 0.70–1.28)
Globulin: 2.3 g/dL (ref 1.9–3.7)
Glucose, Bld: 98 mg/dL (ref 65–99)
Potassium: 4.5 mmol/L (ref 3.5–5.3)
Sodium: 142 mmol/L (ref 135–146)
Total Bilirubin: 0.4 mg/dL (ref 0.2–1.2)
Total Protein: 6.8 g/dL (ref 6.1–8.1)
eGFR: 59 mL/min/1.73m2 — ABNORMAL LOW (ref >=60)

## 2023-12-17 LAB — URIC ACID: Uric Acid, Serum: 5.6 mg/dL (ref 4.0–8.0)

## 2023-12-17 NOTE — Progress Notes (Signed)
 CBC and CMP are stable.  Creatinine is mildly elevated.  Uric acid is in the desirable range.  Please forward results to his PCP.

## 2023-12-23 NOTE — Progress Notes (Signed)
 X-rays of the left tibia and fibula were unremarkable per radiology report.

## 2024-03-01 ENCOUNTER — Ambulatory Visit: Admitting: Cardiovascular Disease

## 2024-06-15 ENCOUNTER — Ambulatory Visit: Admitting: Physician Assistant
